# Patient Record
Sex: Male | Born: 1965 | Race: Black or African American | Hispanic: No | Smoking: Never smoker
Health system: Southern US, Community
[De-identification: ages and names within clinical notes are randomized; demographics above are authoritative.]

## PROBLEM LIST (undated history)

## (undated) DIAGNOSIS — K4090 Unilateral inguinal hernia, without obstruction or gangrene, not specified as recurrent: Secondary | ICD-10-CM

## (undated) DIAGNOSIS — F1010 Alcohol abuse, uncomplicated: Secondary | ICD-10-CM

## (undated) DIAGNOSIS — F1021 Alcohol dependence, in remission: Secondary | ICD-10-CM

## (undated) HISTORY — DX: Alcohol dependence, in remission: F10.21

## (undated) HISTORY — DX: Unilateral inguinal hernia, without obstruction or gangrene, not specified as recurrent: K40.90

## (undated) HISTORY — PX: MANDIBLE FRACTURE SURGERY: SHX706

---

## 2005-08-04 ENCOUNTER — Emergency Department: Payer: Self-pay | Admitting: General Practice

## 2005-08-04 ENCOUNTER — Other Ambulatory Visit: Payer: Self-pay

## 2015-09-14 ENCOUNTER — Other Ambulatory Visit: Payer: Self-pay | Admitting: Internal Medicine

## 2015-09-14 ENCOUNTER — Ambulatory Visit: Payer: Self-pay | Admitting: Internal Medicine

## 2015-09-14 DIAGNOSIS — F17200 Nicotine dependence, unspecified, uncomplicated: Secondary | ICD-10-CM

## 2015-09-14 DIAGNOSIS — I10 Essential (primary) hypertension: Secondary | ICD-10-CM | POA: Insufficient documentation

## 2015-09-14 DIAGNOSIS — R634 Abnormal weight loss: Secondary | ICD-10-CM

## 2015-09-15 ENCOUNTER — Other Ambulatory Visit: Payer: Self-pay

## 2015-09-15 ENCOUNTER — Ambulatory Visit
Admission: RE | Admit: 2015-09-15 | Discharge: 2015-09-15 | Disposition: A | Discharge status: Home / Self Care | Payer: PRIVATE HEALTH INSURANCE | Source: Ambulatory Visit | Attending: Internal Medicine | Admitting: Internal Medicine

## 2015-09-15 DIAGNOSIS — R634 Abnormal weight loss: Secondary | ICD-10-CM

## 2015-09-15 DIAGNOSIS — F17200 Nicotine dependence, unspecified, uncomplicated: Secondary | ICD-10-CM

## 2015-09-15 DIAGNOSIS — R0602 Shortness of breath: Secondary | ICD-10-CM | POA: Insufficient documentation

## 2015-09-15 LAB — BASIC METABOLIC PANEL
BUN: 3 mg/dL — AB (ref 4–21)
Creatinine: 0.6 mg/dL (ref 0.6–1.3)
Glucose: 89 mg/dL
Sodium: 141 mmol/L (ref 137–147)

## 2015-09-15 LAB — HEMOGLOBIN A1C: Hemoglobin A1C: 6

## 2015-09-15 LAB — CBC AND DIFFERENTIAL
HEMATOCRIT: 39 % — AB (ref 41–53)
HEMOGLOBIN: 13.5 g/dL (ref 13.5–17.5)
NEUTROS ABS: 3 /uL
PLATELETS: 152 10*3/uL (ref 150–399)
WBC: 4.6 10^3/mL

## 2015-09-15 LAB — HEPATIC FUNCTION PANEL
ALT: 78 U/L — AB (ref 10–40)
Alkaline Phosphatase: 162 U/L — AB (ref 25–125)
Bilirubin, Total: 0.4 mg/dL

## 2015-09-15 LAB — TSH: TSH: 2.66 u[IU]/mL (ref 0.41–5.90)

## 2015-09-28 ENCOUNTER — Ambulatory Visit: Payer: Self-pay | Admitting: Internal Medicine

## 2015-10-04 ENCOUNTER — Ambulatory Visit: Payer: Self-pay

## 2015-10-06 ENCOUNTER — Ambulatory Visit: Payer: Self-pay

## 2015-10-08 ENCOUNTER — Emergency Department
Admission: EM | Admit: 2015-10-08 | Discharge: 2015-10-08 | Disposition: A | Discharge status: Home / Self Care | Payer: PRIVATE HEALTH INSURANCE | Attending: Emergency Medicine | Admitting: Emergency Medicine

## 2015-10-08 ENCOUNTER — Encounter: Payer: Self-pay | Admitting: Emergency Medicine

## 2015-10-08 DIAGNOSIS — Z87891 Personal history of nicotine dependence: Secondary | ICD-10-CM | POA: Insufficient documentation

## 2015-10-08 DIAGNOSIS — F10920 Alcohol use, unspecified with intoxication, uncomplicated: Secondary | ICD-10-CM

## 2015-10-08 DIAGNOSIS — F10120 Alcohol abuse with intoxication, uncomplicated: Secondary | ICD-10-CM | POA: Insufficient documentation

## 2015-10-08 LAB — BASIC METABOLIC PANEL
ANION GAP: 10 (ref 5–15)
BUN: 7 mg/dL (ref 6–20)
CHLORIDE: 98 mmol/L — AB (ref 101–111)
CO2: 30 mmol/L (ref 22–32)
Calcium: 9.1 mg/dL (ref 8.9–10.3)
Creatinine, Ser: 0.64 mg/dL (ref 0.61–1.24)
GFR calc Af Amer: >60 mL/min (ref >60)
GFR calc non Af Amer: >60 mL/min (ref >60)
GLUCOSE: 102 mg/dL — AB (ref 65–99)
POTASSIUM: 4 mmol/L (ref 3.5–5.1)
Sodium: 138 mmol/L (ref 135–145)

## 2015-10-08 LAB — ETHANOL: Alcohol, Ethyl (B): <5 mg/dL (ref <5)

## 2015-10-08 MED ORDER — LORAZEPAM 2 MG/ML IJ SOLN: 0.0000 mg | Freq: Four times a day (QID) | INTRAMUSCULAR | Status: DC

## 2015-10-08 MED ORDER — FOLIC ACID 1 MG PO TABS: 1.0000 mg | ORAL_TABLET | Freq: Once | ORAL | Status: DC

## 2015-10-08 MED ORDER — VITAMIN B-1 100 MG PO TABS
100.0000 mg | ORAL_TABLET | Freq: Every day | ORAL | Status: DC
  Administered 2015-10-08: 100 mg via ORAL
  Filled 2015-10-08: qty 1

## 2015-10-08 MED ORDER — LORAZEPAM 1 MG PO TABS
ORAL_TABLET | ORAL | Status: AC
  Filled 2015-10-08: qty 1

## 2015-10-08 MED ORDER — LORAZEPAM 0.5 MG PO TABS: 1.5000 mg | ORAL_TABLET | Freq: Once | ORAL | Status: DC

## 2015-10-08 MED ORDER — VITAMIN B-1 100 MG PO TABS: 100.0000 mg | ORAL_TABLET | Freq: Once | ORAL | Status: DC

## 2015-10-08 MED ORDER — LORAZEPAM 2 MG/ML IJ SOLN
0.0000 mg | Freq: Two times a day (BID) | INTRAMUSCULAR | Status: DC
  Administered 2015-10-08: 1 mg via INTRAVENOUS

## 2015-10-08 MED ORDER — THIAMINE HCL 100 MG/ML IJ SOLN: 100.0000 mg | Freq: Every day | INTRAMUSCULAR | Status: DC

## 2015-10-08 MED ORDER — LORAZEPAM 2 MG PO TABS: 0.0000 mg | ORAL_TABLET | Freq: Four times a day (QID) | ORAL | Status: DC

## 2015-10-08 MED ORDER — LORAZEPAM 2 MG PO TABS: 0.0000 mg | ORAL_TABLET | Freq: Two times a day (BID) | ORAL | Status: DC

## 2015-10-08 NOTE — ED Notes (Signed)
Discussed plan of care with patient, physician and patient's mother.  Patient verbalized understanding.

## 2015-10-08 NOTE — ED Notes (Signed)
Per CSW Joni Reining, pt can be d/c from ED, transported to [facility] by mother since [facility] has available beds and can assess the pt there. MD Malinda made aware, d/c pt at this time. Pt and pt's mother made aware and verbalized understanding at this time

## 2015-10-08 NOTE — ED Notes (Signed)
Patient resting quietly in room.  Plan of care discussed.  Will continue to monitor.

## 2015-10-08 NOTE — ED Notes (Signed)
States requesting detox for alcohol abuse, states has stopped before when in jail but has withdrawal symptoms. States last drink yesterday.

## 2015-10-08 NOTE — ED Notes (Signed)
Pt ambulatory to bathroom at this time independently with no concerns. Pt tolerated well, no acute distress noted at this time

## 2015-10-08 NOTE — Discharge Instructions (Signed)
Alcohol Intoxication Alcohol intoxication occurs when the amount of alcohol that a person has consumed impairs his or her ability to mentally and physically function. Alcohol directly impairs the normal chemical activity of the brain. Drinking large amounts of alcohol can lead to changes in mental function and behavior, and it can cause many physical effects that can be harmful.  Alcohol intoxication can range in severity from mild to very severe. Various factors can affect the level of intoxication that occurs, such as the person's age, gender, weight, frequency of alcohol consumption, and the presence of other medical conditions (such as diabetes, seizures, or heart conditions). Dangerous levels of alcohol intoxication may occur when people drink large amounts of alcohol in a short period (binge drinking). Alcohol can also be especially dangerous when combined with certain prescription medicines or "recreational" drugs. SIGNS AND SYMPTOMS Some common signs and symptoms of mild alcohol intoxication include:  Loss of coordination.  Changes in mood and behavior.  Impaired judgment.  Slurred speech. As alcohol intoxication progresses to more severe levels, other signs and symptoms will appear. These may include:  Vomiting.  Confusion and impaired memory.  Slowed breathing.  Seizures.  Loss of consciousness. DIAGNOSIS  Your health care provider will take a medical history and perform a physical exam. You will be asked about the amount and type of alcohol you have consumed. Blood tests will be done to measure the concentration of alcohol in your blood. In many places, your blood alcohol level must be lower than 80 mg/dL (0.08%) to legally drive. However, many dangerous effects of alcohol can occur at much lower levels.  TREATMENT  People with alcohol intoxication often do not require treatment. Most of the effects of alcohol intoxication are temporary, and they go away as the alcohol naturally  leaves the body. Your health care provider will monitor your condition until you are stable enough to go home. Fluids are sometimes given through an IV access tube to help prevent dehydration.  HOME CARE INSTRUCTIONS  Do not drive after drinking alcohol.  Stay hydrated. Drink enough water and fluids to keep your urine clear or pale yellow. Avoid caffeine.   Only take over-the-counter or prescription medicines as directed by your health care provider.  SEEK MEDICAL CARE IF:   You have persistent vomiting.   You do not feel better after a few days.  You have frequent alcohol intoxication. Your health care provider can help determine if you should see a substance use treatment counselor. SEEK IMMEDIATE MEDICAL CARE IF:   You become shaky or tremble when you try to stop drinking.   You shake uncontrollably (seizure).   You throw up (vomit) blood. This may be bright red or may look like black coffee grounds.   You have blood in your stool. This may be bright red or may appear as a black, tarry, bad smelling stool.   You become lightheaded or faint.  MAKE SURE YOU:   Understand these instructions.  Will watch your condition.  Will get help right away if you are not doing well or get worse.   This information is not intended to replace advice given to you by your health care provider. Make sure you discuss any questions you have with your health care provider.   Document Released: 06/13/2005 Document Revised: 05/06/2013 Document Reviewed: 02/06/2013 Elsevier Interactive Patient Education 2016 [employer].  Alcohol Use Disorder Alcohol use disorder is a mental disorder. It is not a one-time incident of heavy drinking. Alcohol use  Document Released: 06/13/2005 Document Revised: 05/06/2013 Document Reviewed: 02/06/2013  Elsevier Interactive Patient Education ©2016 Else[employer] Inc.    Alcohol Use Disorder  Alcohol use disorder is a mental disorder. It is not a one-time incident of heavy drinking. Alcohol use disorder is the excessive and uncontrollable use of alcohol over time that leads to problems with functioning in one or more areas of daily living. People with this disorder risk harming themselves and others when they drink to excess. Alcohol use  disorder also can cause other mental disorders, such as mood and anxiety disorders, and serious physical problems. People with alcohol use disorder often misuse other drugs.   Alcohol use disorder is common and widespread. Some people with this disorder drink alcohol to cope with or escape from negative life events. Others drink to relieve chronic pain or symptoms of mental illness. People with a family history of alcohol use disorder are at higher risk of losing control and using alcohol to excess.   Drinking too much alcohol can cause injury, accidents, and health problems. One drink can be too much when you are:  · Working.  · Pregnant or breastfeeding.  · Taking medicines. Ask your doctor.  · Driving or planning to drive.  SYMPTOMS   Signs and symptoms of alcohol use disorder may include the following:   · Consumption of alcohol in larger amounts or over a longer period of time than intended.  · Multiple unsuccessful attempts to cut down or control alcohol use.    · A great deal of time spent obtaining alcohol, using alcohol, or recovering from the effects of alcohol (hangover).  · A strong desire or urge to use alcohol (cravings).    · Continued use of alcohol despite problems at work, school, or home because of alcohol use.    · Continued use of alcohol despite problems in relationships because of alcohol use.  · Continued use of alcohol in situations when it is physically hazardous, such as driving a car.  · Continued use of alcohol despite awareness of a physical or psychological problem that is likely related to alcohol use. Physical problems related to alcohol use can involve the brain, heart, liver, stomach, and intestines. Psychological problems related to alcohol use include intoxication, depression, anxiety, psychosis, delirium, and dementia.    · The need for increased amounts of alcohol to achieve the same desired effect, or a decreased effect from the consumption of the same amount of alcohol  (tolerance).  · Withdrawal symptoms upon reducing or stopping alcohol use, or alcohol use to reduce or avoid withdrawal symptoms. Withdrawal symptoms include:  ¨ Racing heart.  ¨ Hand tremor.  ¨ Difficulty sleeping.  ¨ Nausea.  ¨ Vomiting.  ¨ Hallucinations.  ¨ Restlessness.  ¨ Seizures.  DIAGNOSIS  Alcohol use disorder is diagnosed through an assessment by your health care provider. Your health care provider may start by asking three or four questions to screen for excessive or problematic alcohol use. To confirm a diagnosis of alcohol use disorder, at least two symptoms must be present within a 12-month period. The severity of alcohol use disorder depends on the number of symptoms:  · Mild--two or three.  · Moderate--four or five.  · Severe--six or more.  Your health care provider may perform a physical exam or use results from lab tests to see if you have physical problems resulting from alcohol use. Your health care provider may refer you to a mental health professional for evaluation.  TREATMENT   Some people with alcohol use disorder are   withdrawal symptoms in the first week after quitting. This is important for people with a history of symptoms of withdrawal and for heavy drinkers who are likely to have withdrawal symptoms. Alcohol withdrawal can be dangerous and, in severe cases, cause death. Detoxification is usually provided in a hospital or in-patient substance use treatment facility.  Counseling or talk therapy.  Talk therapy is provided by substance use treatment counselors. It addresses the reasons people use alcohol and ways to keep them from drinking again. The goals of talk therapy are to help people with alcohol use disorder find healthy activities and ways to cope with life stress, to identify and avoid triggers for alcohol use, and to handle cravings, which can cause relapse.  Medicines.Different medicines can help treat alcohol use disorder through the following actions:  Decrease alcohol cravings.  Decrease the positive reward response felt from alcohol use.  Produce an uncomfortable physical reaction when alcohol is used (aversion therapy).  Support groups. Support groups are run by people who have quit drinking. They provide emotional support, advice, and guidance. These forms of treatment are often combined. Some people with alcohol use disorder benefit from intensive combination treatment provided by specialized substance use treatment centers. Both inpatient and outpatient treatment programs are available.   This information is not intended to replace advice given to you by your health care provider. Make sure you discuss any questions you have with your health care provider.   Document Released: 10/11/2004 Document Revised: 09/24/2014 Document Reviewed: 12/11/2012 Elsevier Interactive Patient Education 2016 [employer].   Goes straight to [facility] now.

## 2015-10-08 NOTE — ED Provider Notes (Signed)
[facility] Emergency Department Provider Note  ____________________________________________  Time seen: Approximately 2:35 PM  I have reviewed the triage vital signs and the nursing notes.   HISTORY  Chief Complaint Addiction Problem    HPI Kristopher Duran is a 50 y.o. male presents for evaluation of alcohol withdrawal. The patient states he stopped drinking yesterday, he normally drinks 4 x 40 ounce beers and has been experiencing the shakes since this morning. His a history ofalcoholism. He reports he had withdrawals once when he was in jail. He otherwise reports he drinks most every day. He is not suicidal, denies hallucinations, has no history of withdrawal seizure. He reports that he is just tired of the way alcohol is been interfering with his life and would like to stop drinking. He is amenable and requesting evaluation for detox and a detox facility.   Past Medical History  Diagnosis Date  . Alcohol abuse     There are no active problems to display for this patient.   Past Surgical History  Procedure Laterality Date  . Mandible fracture surgery      No current outpatient prescriptions on file.  Allergies Review of patient's allergies indicates no known allergies.  No family history on file.  Social History Social History  Substance Use Topics  . Smoking status: Former Games developer  . Smokeless tobacco: None  . Alcohol Use: Yes    Review of Systems Constitutional: No fever/chills Eyes: No visual changes. ENT: No sore throat. Cardiovascular: Denies chest pain. Respiratory: Denies shortness of breath. Gastrointestinal: No abdominal pain.  No nausea, no vomiting.  No diarrhea.  No constipation. Genitourinary: Negative for dysuria. Musculoskeletal: Negative for back pain. Skin: Negative for rash. Neurological: Negative for headaches, focal weakness or numbness. Does report having "the shakes".  10-point ROS otherwise  negative.  ____________________________________________   PHYSICAL EXAM:  VITAL SIGNS: ED Triage Vitals  Enc Vitals Group     BP 10/08/15 1030 139/90 mmHg     Pulse Rate 10/08/15 1030 76     Resp 10/08/15 1030 18     Temp 10/08/15 1030 97.7 F (36.5 C)     Temp Source 10/08/15 1030 Oral     SpO2 10/08/15 1030 100 %     Weight 10/08/15 1030 128 lb (58.06 kg)     Height 10/08/15 1030  (1.753 m)     Head Cir --      Peak Flow --      Pain Score --      Pain Loc --      Pain Edu? --      Excl. in GC? --    Constitutional: Alert and oriented. Well appearing and in no acute distress. Eyes: Conjunctivae are normal. PERRL. EOMI. Head: Atraumatic. Slightly diaphoretic on the forehead. Nose: No congestion/rhinnorhea. Mouth/Throat: Mucous membranes are moist.  Oropharynx non-erythematous. Neck: No stridor.   Cardiovascular: Normal rate, regular rhythm. Grossly normal heart sounds.  Good peripheral circulation. Respiratory: Normal respiratory effort.  No retractions. Lungs CTAB. Gastrointestinal: Soft and nontender. No distention. No abdominal bruits. No CVA tenderness. Musculoskeletal: No lower extremity tenderness nor edema.  No joint effusions. Neurologic:  Normal speech and language. No gross focal neurologic deficits are appreciated though the patient does demonstrate a moderate high-frequency tremor in the bilateral upper extremities. No gait instability. Skin:  Skin is warm, dry and intact. No rash noted. Psychiatric: Mood and affect are normal. Speech and behavior are normal.  ____________________________________________   LABS (all  labs ordered are listed, but only abnormal results are displayed)  Labs Reviewed  BASIC METABOLIC PANEL - Abnormal; Notable for the following:    Chloride 98 (*)    Glucose, Bld 102 (*)    All other components within normal limits  ETHANOL    ____________________________________________  EKG   ____________________________________________  RADIOLOGY   ____________________________________________   PROCEDURES  Procedure(s) performed: None  Critical Care performed: No  ____________________________________________   INITIAL IMPRESSION / ASSESSMENT AND PLAN / ED COURSE  Pertinent labs & imaging results that were available during my care of the patient were reviewed by me and considered in my medical decision making (see chart for details).  Patient requests detox. Currently signs and symptoms of mild alcohol withdrawal. No history of complicated withdrawal and no evidence of active hallucinations or delirium tremens. We will treat him with alcohol withdrawal protocol, consult to [facility] services for detox placement. Patient is agreeable with this plan. No signs or symptoms suggest acute medical condition aside from alcohol withdrawal. Denies any acute psychiatric symptoms. ____________________________________________  Ongoing care and disposition assigned to Dr. Darnelle Catalan. Patient currently on treatment for alcohol withdrawal, pending detox placement.  FINAL CLINICAL IMPRESSION(S) / ED DIAGNOSES  Final diagnoses:  Acute alcoholic intoxication without complication (HCC)      Sharyn Creamer, MD 10/08/15 1649

## 2015-10-08 NOTE — Progress Notes (Signed)
Pt labs, vitals and MAR faxed to [facility]. [facility] awaiting a return phone call.   10/08/2015 Cheryl Flash, MS, NCC, [facility] Therapeutic Triage Specialist

## 2015-11-04 DIAGNOSIS — I10 Essential (primary) hypertension: Secondary | ICD-10-CM

## 2015-11-09 ENCOUNTER — Encounter: Payer: Self-pay | Admitting: Physician Assistant

## 2015-11-09 ENCOUNTER — Ambulatory Visit: Payer: Self-pay | Admitting: Physician Assistant

## 2015-11-09 VITALS — BP 114/78 | HR 76 | Temp 98.1°F | Ht 68.0 in | Wt 144.6 lb

## 2015-11-09 DIAGNOSIS — Z125 Encounter for screening for malignant neoplasm of prostate: Secondary | ICD-10-CM

## 2015-11-09 DIAGNOSIS — Z131 Encounter for screening for diabetes mellitus: Secondary | ICD-10-CM

## 2015-11-09 DIAGNOSIS — Z13220 Encounter for screening for lipoid disorders: Secondary | ICD-10-CM

## 2015-11-09 DIAGNOSIS — K4090 Unilateral inguinal hernia, without obstruction or gangrene, not specified as recurrent: Secondary | ICD-10-CM

## 2015-11-09 DIAGNOSIS — F1021 Alcohol dependence, in remission: Secondary | ICD-10-CM | POA: Insufficient documentation

## 2015-11-09 DIAGNOSIS — K469 Unspecified abdominal hernia without obstruction or gangrene: Secondary | ICD-10-CM | POA: Insufficient documentation

## 2015-11-09 LAB — GLUCOSE, POCT (MANUAL RESULT ENTRY): POC GLUCOSE: 92 mg/dL (ref 70–99)

## 2015-11-09 NOTE — Patient Instructions (Signed)
Blood draw Thursday Turn in [employer] application

## 2015-11-09 NOTE — Progress Notes (Signed)
BP 114/78 mmHg  Pulse 76  Temp(Src) 98.1 F (36.7 C)  Ht  (1.727 m)  Wt 144 lb 9.6 oz (65.59 kg)  BMI 21.99 kg/m2  SpO2 97%   Subjective:    Patient ID: Kristopher Duran, male    DOB: 01/10/1966, 50 y.o.   MRN: [id number]  HPI: Kristopher Duran is a 50 y.o. male presenting on 11/09/2015 for New Patient (Initial Visit) and Mass   HPI Chief Complaint  Patient presents with  . New Patient (Initial Visit)  . Mass    in lower abdomen, goes away when reclines, sometimes is painful; has been present intermitently for 10 yrs, has gotten much larger since stopped drinking and started eating again     Mass as above. Sometimes it hurts, but not all the time  Relevant past medical, surgical, family and social history reviewed and updated as indicated. Interim medical history since our last visit reviewed. Allergies and medications reviewed and updated.  Review of Systems  Constitutional: Negative for fever, chills, diaphoresis, appetite change, fatigue and unexpected weight change.  HENT: Negative for congestion, dental problem, drooling, ear pain, facial swelling, hearing loss, mouth sores, sneezing, sore throat, trouble swallowing and voice change.   Eyes: Negative for pain, discharge, redness, itching and visual disturbance.  Respiratory: Negative for cough, choking, shortness of breath and wheezing.   Cardiovascular: Negative for chest pain, palpitations and leg swelling.  Gastrointestinal: Negative for vomiting, abdominal pain, diarrhea, constipation and blood in stool.  Endocrine: Negative for cold intolerance, heat intolerance and polydipsia.  Genitourinary: Negative for dysuria, hematuria and decreased urine volume.  Musculoskeletal: Negative for back pain, arthralgias and gait problem.  Skin: Negative for rash.  Allergic/Immunologic: Negative for environmental allergies.  Neurological: Negative for seizures, syncope, light-headedness and headaches.  Hematological: Negative  for adenopathy.  Psychiatric/Behavioral: Negative for suicidal ideas, dysphoric mood and agitation. The patient is not nervous/anxious.     Per HPI unless specifically indicated above     Objective:    BP 114/78 mmHg  Pulse 76  Temp(Src) 98.1 F (36.7 C)  Ht  (1.727 m)  Wt 144 lb 9.6 oz (65.59 kg)  BMI 21.99 kg/m2  SpO2 97%  Wt Readings from Last 3 Encounters:  11/09/15 144 lb 9.6 oz (65.59 kg)  10/08/15 128 lb (58.06 kg)    Physical Exam  Constitutional: He is oriented to person, place, and time. He appears well-developed and well-nourished.  HENT:  Head: Normocephalic and atraumatic.  Mouth/Throat: Oropharynx is clear and moist. No oropharyngeal exudate.  Eyes: Conjunctivae and EOM are normal. Pupils are equal, round, and reactive to light.  Neck: Neck supple. No thyromegaly present.  Cardiovascular: Normal rate and regular rhythm.   Pulmonary/Chest: Effort normal and breath sounds normal. He has no wheezes. He has no rales.  Abdominal: Soft. Bowel sounds are normal. He exhibits no mass. There is no hepatosplenomegaly. There is no tenderness. A hernia is present. Hernia confirmed positive in the left inguinal area (extending into scrotum, reducible, non-tender).  Musculoskeletal: He exhibits no edema.  Lymphadenopathy:    He has no cervical adenopathy.  Neurological: He is alert and oriented to person, place, and time.  Skin: Skin is warm and dry. No rash noted.  Psychiatric: He has a normal mood and affect. His behavior is normal. Thought content normal.  Vitals reviewed.  L ing hernia exteinding ito scrotum Results for orders placed or performed in visit on 11/09/15  POCT Glucose (CBG)  Result  Value Ref Range   POC Glucose 92 70 - 99 mg/dl      Assessment & Plan:   Encounter Diagnoses  Name Primary?  . Unilateral inguinal hernia without obstruction or gangrene, recurrence not specified Yes  . Screening for diabetes mellitus   . Screening cholesterol level    . Screening for prostate cancer     -Check fasting lipids and LFTs tomorrow morning -Refer surgeon for hernia.  Pt counseled to go to ER if it becomes strangulated (explained this to him) -Gave [facility] discount application -F/u 1 month

## 2015-12-06 ENCOUNTER — Ambulatory Visit: Payer: Self-pay

## 2015-12-07 ENCOUNTER — Ambulatory Visit: Payer: Self-pay | Admitting: Physician Assistant

## 2015-12-08 ENCOUNTER — Encounter: Payer: Self-pay | Admitting: Physician Assistant

## 2015-12-19 ENCOUNTER — Other Ambulatory Visit
Admission: RE | Admit: 2015-12-19 | Discharge: 2015-12-19 | Disposition: A | Discharge status: Home / Self Care | Attending: Family Medicine | Admitting: Family Medicine

## 2015-12-19 NOTE — ED Notes (Signed)
Patient ambulatory to triage with steady gait, without difficulty or distress noted, in custody of [place] [employer] officer Koleen Nimrod for forensic blood draw; pt A&Ox3, with no c/o voiced and denies need to see ED provider; pt voices good understanding of blood draw to be performed for forensic testing; using sealed kit provided by officer, tourniquet applied to right upper arm; right antecubital region prepped with betadine swab and allowed to dry completely; needle inserted and 2 grey top blood tubes collected; tourniquet removed, needle removed & intact, dressing applied; tubes labeled, given to officer and placed in sealed container using chain of custody; pt tolerated well and continues to deny c/o or need to see ED provider; pt d/c in police custody

## 2015-12-20 ENCOUNTER — Encounter: Payer: Self-pay | Admitting: Emergency Medicine

## 2015-12-20 ENCOUNTER — Emergency Department
Admission: EM | Admit: 2015-12-20 | Discharge: 2015-12-20 | Disposition: A | Discharge status: Rehabilitation Facility | Payer: PRIVATE HEALTH INSURANCE | Attending: Emergency Medicine | Admitting: Emergency Medicine

## 2015-12-20 DIAGNOSIS — Z87891 Personal history of nicotine dependence: Secondary | ICD-10-CM | POA: Insufficient documentation

## 2015-12-20 DIAGNOSIS — I10 Essential (primary) hypertension: Secondary | ICD-10-CM | POA: Insufficient documentation

## 2015-12-20 DIAGNOSIS — F10120 Alcohol abuse with intoxication, uncomplicated: Secondary | ICD-10-CM | POA: Insufficient documentation

## 2015-12-20 DIAGNOSIS — F10920 Alcohol use, unspecified with intoxication, uncomplicated: Secondary | ICD-10-CM

## 2015-12-20 DIAGNOSIS — F1010 Alcohol abuse, uncomplicated: Secondary | ICD-10-CM

## 2015-12-20 LAB — COMPREHENSIVE METABOLIC PANEL
ALBUMIN: 4.4 g/dL (ref 3.5–5.0)
ALT: 38 U/L (ref 17–63)
ANION GAP: 9 (ref 5–15)
AST: 133 U/L — AB (ref 15–41)
Alkaline Phosphatase: 88 U/L (ref 38–126)
CHLORIDE: 102 mmol/L (ref 101–111)
CO2: 26 mmol/L (ref 22–32)
Calcium: 8.8 mg/dL — ABNORMAL LOW (ref 8.9–10.3)
Creatinine, Ser: 0.75 mg/dL (ref 0.61–1.24)
GFR calc Af Amer: >60 mL/min (ref >60)
GFR calc non Af Amer: >60 mL/min (ref >60)
GLUCOSE: 101 mg/dL — AB (ref 65–99)
POTASSIUM: 3.5 mmol/L (ref 3.5–5.1)
SODIUM: 137 mmol/L (ref 135–145)
TOTAL PROTEIN: 7.6 g/dL (ref 6.5–8.1)
Total Bilirubin: 0.7 mg/dL (ref 0.3–1.2)

## 2015-12-20 LAB — ETHANOL
ALCOHOL ETHYL (B): 321 mg/dL — AB (ref <5)
ALCOHOL ETHYL (B): 160 mg/dL — AB (ref <5)
ALCOHOL ETHYL (B): 64 mg/dL — AB (ref <5)

## 2015-12-20 LAB — URINE DRUG SCREEN, QUALITATIVE (ARMC ONLY)
Amphetamines, Ur Screen: NOT DETECTED
BARBITURATES, UR SCREEN: NOT DETECTED
BENZODIAZEPINE, UR SCRN: NOT DETECTED
CANNABINOID 50 NG, UR ~~LOC~~: NOT DETECTED
Cocaine Metabolite,Ur ~~LOC~~: NOT DETECTED
MDMA (Ecstasy)Ur Screen: NOT DETECTED
METHADONE SCREEN, URINE: NOT DETECTED
Opiate, Ur Screen: NOT DETECTED
Phencyclidine (PCP) Ur S: NOT DETECTED
TRICYCLIC, UR SCREEN: NOT DETECTED

## 2015-12-20 LAB — CBC
HEMATOCRIT: 43.5 % (ref 40.0–52.0)
HEMOGLOBIN: 14.5 g/dL (ref 13.0–18.0)
MCH: 30 pg (ref 26.0–34.0)
MCHC: 33.4 g/dL (ref 32.0–36.0)
MCV: 89.8 fL (ref 80.0–100.0)
Platelets: 253 10*3/uL (ref 150–440)
RBC: 4.84 MIL/uL (ref 4.40–5.90)
RDW: 14.1 % (ref 11.5–14.5)
WBC: 5.7 10*3/uL (ref 3.8–10.6)

## 2015-12-20 MED ORDER — VITAMIN B-1 100 MG PO TABS
100.0000 mg | ORAL_TABLET | Freq: Once | ORAL | Status: AC
  Administered 2015-12-20: 100 mg via ORAL
  Filled 2015-12-20: qty 1

## 2015-12-20 MED ORDER — FOLIC ACID 1 MG PO TABS
1.0000 mg | ORAL_TABLET | Freq: Once | ORAL | Status: AC
  Administered 2015-12-20: 1 mg via ORAL
  Filled 2015-12-20: qty 1

## 2015-12-20 MED ORDER — LORAZEPAM 2 MG PO TABS
0.0000 mg | ORAL_TABLET | Freq: Four times a day (QID) | ORAL | Status: DC
  Administered 2015-12-20: 2 mg via ORAL
  Administered 2015-12-20: 1 mg via ORAL
  Filled 2015-12-20 (×2): qty 1

## 2015-12-20 MED ORDER — LORAZEPAM 2 MG PO TABS: 0.0000 mg | ORAL_TABLET | Freq: Two times a day (BID) | ORAL | Status: DC

## 2015-12-20 NOTE — Discharge Instructions (Signed)
You have been seen in the Emergency Department (ED) today for substance abuse.  You have been evaluated by the behavioral medicine specialists and are being [facility]  Please return to the ED immediately if you have ANY thoughts of hurting yourself or anyone else, so that we may help you.  Please avoid alcohol and drug use.  Follow up with your doctor and/or therapist as soon as possible regarding today's ED  visit.   Please follow up any other recommendations and clinic appointments provided by the psychiatry team that saw you in the Emergency Department.   Alcohol Abuse and Nutrition Alcohol abuse is any pattern of alcohol consumption that harms your health, relationships, or work. Alcohol abuse can affect how your body breaks down and absorbs nutrients from food by causing your liver to work abnormally. Additionally, many people who abuse alcohol do not eat enough carbohydrates, protein, fat, vitamins, and minerals. This can cause poor nutrition (malnutrition) and a lack of nutrients (nutrient deficiencies), which can lead to further complications. Nutrients that are commonly lacking (deficient) among people who abuse alcohol include:  Vitamins.  Vitamin A. This is stored in your liver. It is important for your vision, metabolism, and ability to fight off infections (immunity).  B vitamins. These include vitamins such as folate, thiamin, and niacin. These are important in new cell growth and maintenance.  Vitamin C. This plays an important role in iron absorption, wound healing, and immunity.  Vitamin D. This is produced by your liver, but you can also get vitamin D from food. Vitamin D is necessary for your body to absorb and use calcium.  Minerals.  Calcium. This is important for your bones and your heart and blood vessel (cardiovascular) function.  Iron. This is important for blood, muscle, and nervous system functioning.  Magnesium. This  plays an important role in muscle and nerve function, and it helps to control blood sugar and blood pressure.  Zinc. This is important for the normal function of your nervous system and digestive system (gastrointestinal tract). Nutrition is an essential component of therapy for alcohol abuse. Your health care provider or dietitian will work with you to design a plan that can help restore nutrients to your body and prevent potential complications. WHAT IS MY PLAN? Your dietitian may develop a specific diet plan that is based on your condition and any other complications you may have. A diet plan will commonly include:  A balanced diet.  Grains: 6-8 oz per day.  Vegetables: 2-3 cups per day.  Fruits: 1-2 cups per day.  Meat and other protein: 5-6 oz per day.  Dairy: 2-3 cups per day.  Vitamin and mineral supplements. WHAT DO I NEED TO KNOW ABOUT ALCOHOL AND NUTRITION?  Consume foods that are high in antioxidants, such as grapes, berries, nuts, green tea, and dark green and orange vegetables. This can help to counteract some of the stress that is placed on your liver by consuming alcohol.  Avoid food and drinks that are high in fat and sugar. Foods such as sugared soft drinks, salty snack foods, and candy contain empty calories. This means that they lack important nutrients such as protein, fiber, and vitamins.  Eat frequent meals and snacks. Try to eat 5-6 small meals each day.  Eat a variety of fresh fruits and vegetables each day. This will help you get plenty of water, fiber, and vitamins in your diet.  Drink plenty of water and other clear fluids. Try  to drink at least 48-64 oz (1.5-2 L) of water per day.  If you are a vegetarian, eat a variety of protein-rich foods. Pair whole grains with plant-based proteins at meals and snacks to obtain the greatest nutrient benefit from your food. For example, eat rice with beans, put peanut butter on whole-grain toast, or eat oatmeal with  sunflower seeds.  Soak beans and whole grains overnight before cooking. This can help your body to absorb the nutrients more easily.  Include foods fortified with vitamins and minerals in your diet. Commonly fortified foods include milk, orange juice, cereal, and bread.  If you are malnourished, your dietitian may recommend a high-protein, high-calorie diet. This may include:  2,000-3,000 calories (kilocalories) per day.  70-100 grams of protein per day.  Your health care provider may recommend a complete nutritional supplement beverage. This can help to restore calories, protein, and vitamins to your body. Depending on your condition, you may be advised to consume this instead of or in addition to meals.  Limit your intake of caffeine. Replace drinks like coffee and black tea with decaffeinated coffee and herbal tea.  Eat a variety of foods that are high in omega fatty acids. These include fish, nuts and seeds, and soybeans. These foods may help your liver to recover and may also stabilize your mood.  Certain medicines may cause changes in your appetite, taste, and weight. Work with your health care provider and dietitian to make any adjustments to your medicines and diet plan.  Include other healthy lifestyle choices in your daily routine.  Be physically active.  Get enough sleep.  Spend time doing activities that you enjoy.  If you are unable to take in enough food and calories by mouth, your health care provider may recommend a feeding tube. This is a tube that passes through your nose and throat, directly into your stomach. Nutritional supplement beverages can be given to you through the feeding tube to help you get the nutrients you need.  Take vitamin or mineral supplements as recommended by your health care provider. WHAT FOODS CAN I EAT? Grains Enriched pasta. Enriched rice. Fortified whole-grain bread. Fortified whole-grain cereal. Barley. Brown rice. Quinoa.  [place]. Vegetables All fresh, frozen, and canned vegetables. Spinach. Kale. Artichoke. Carrots. Winter squash and pumpkin. Sweet potatoes. Broccoli. Cabbage. Cucumbers. Tomatoes. Sweet peppers. Green beans. Peas. Corn. Fruits All fresh and frozen fruits. Berries. Grapes. Mango. Papaya. Guava. Cherries. Apples. Bananas. Peaches. Plums. Pineapple. Watermelon. Cantaloupe. Oranges. Avocado. Meats and Other Protein Sources Beef liver. Lean beef. Pork. Fresh and canned chicken. Fresh fish. Oysters. Sardines. Canned tuna. Shrimp. Eggs with yolks. Nuts and seeds. Peanut butter. Beans and lentils. Soybeans. Tofu. Dairy Whole, low-fat, and nonfat milk. Whole, low-fat, and nonfat yogurt. Cottage cheese. Sour cream. Hard and soft cheeses. Beverages Water. Herbal tea. Decaffeinated coffee. Decaffeinated green tea. 100% fruit juice. 100% vegetable juice. Instant breakfast shakes. Condiments Ketchup. Mayonnaise. Mustard. Salad dressing. Barbecue sauce. Sweets and Desserts Sugar-free ice cream. Sugar-free pudding. Sugar-free gelatin. Fats and Oils Butter. Vegetable oil, flaxseed oil, olive oil, and walnut oil. Other Complete nutrition shakes. Protein bars. Sugar-free gum. The items listed above may not be a complete list of recommended foods or beverages. Contact your dietitian for more options. WHAT FOODS ARE NOT RECOMMENDED? Grains Sugar-sweetened breakfast cereals. Flavored instant oatmeal. Fried breads. Vegetables Breaded or deep-fried vegetables. Fruits Dried fruit with added sugar. Candied fruit. Canned fruit in syrup. Meats and Other Protein Sources Breaded or deep-fried meats. Dairy Flavored milks. [place] Pro  cheese curds or fried cheese sticks. Beverages Alcohol. Sugar-sweetened soft drinks. Sugar-sweetened tea. Caffeinated coffee and tea. Condiments Sugar. Honey. Agave nectar. Molasses. Sweets and Desserts Chocolate. Cake. Cookies. Candy. Other Potato chips. Pretzels. Salted nuts. Candied  nuts. The items listed above may not be a complete list of foods and beverages to avoid. Contact your dietitian for more information.   This information is not intended to replace advice given to you by your health care provider. Make sure you discuss any questions you have with your health care provider.   Document Released: 06/28/2005 Document Revised: 09/24/2014 Document Reviewed: 04/06/2014 Elsevier Interactive Patient Education 2016 [employer].  Alcohol Intoxication Alcohol intoxication occurs when the amount of alcohol that a person has consumed impairs his or her ability to mentally and physically function. Alcohol directly impairs the normal chemical activity of the brain. Drinking large amounts of alcohol can lead to changes in mental function and behavior, and it can cause many physical effects that can be harmful.  Alcohol intoxication can range in severity from mild to very severe. Various factors can affect the level of intoxication that occurs, such as the person's age, gender, weight, frequency of alcohol consumption, and the presence of other medical conditions (such as diabetes, seizures, or heart conditions). Dangerous levels of alcohol intoxication may occur when people drink large amounts of alcohol in a short period (binge drinking). Alcohol can also be especially dangerous when combined with certain prescription medicines or "recreational" drugs. SIGNS AND SYMPTOMS Some common signs and symptoms of mild alcohol intoxication include:  Loss of coordination.  Changes in mood and behavior.  Impaired judgment.  Slurred speech. As alcohol intoxication progresses to more severe levels, other signs and symptoms will appear. These may include:  Vomiting.  Confusion and impaired memory.  Slowed breathing.  Seizures.  Loss of consciousness. DIAGNOSIS  Your health care provider will take a medical history and perform a physical exam. You will be asked about the amount  and type of alcohol you have consumed. Blood tests will be done to measure the concentration of alcohol in your blood. In many places, your blood alcohol level must be lower than 80 mg/dL (1.61%) to legally drive. However, many dangerous effects of alcohol can occur at much lower levels.  TREATMENT  People with alcohol intoxication often do not require treatment. Most of the effects of alcohol intoxication are temporary, and they go away as the alcohol naturally leaves the body. Your health care provider will monitor your condition until you are stable enough to go home. Fluids are sometimes given through an IV access tube to help prevent dehydration.  HOME CARE INSTRUCTIONS  Do not drive after drinking alcohol.  Stay hydrated. Drink enough water and fluids to keep your urine clear or pale yellow. Avoid caffeine.   Only take over-the-counter or prescription medicines as directed by your health care provider.  SEEK MEDICAL CARE IF:   You have persistent vomiting.   You do not feel better after a few days.  You have frequent alcohol intoxication. Your health care provider can help determine if you should see a substance use treatment counselor. SEEK IMMEDIATE MEDICAL CARE IF:   You become shaky or tremble when you try to stop drinking.   You shake uncontrollably (seizure).   You throw up (vomit) blood. This may be bright red or may look like black coffee grounds.   You have blood in your stool. This may be bright red or may appear as a black,  tarry, bad smelling stool.   You become lightheaded or faint.  MAKE SURE YOU:   Understand these instructions.  Will watch your condition.  Will get help right away if you are not doing well or get worse.   This information is not intended to replace advice given to you by your health care provider. Make sure you discuss any questions you have with your health care provider.   Document Released: 06/13/2005 Document Revised: 05/06/2013  Document Reviewed: 02/06/2013 Elsevier Interactive Patient Education 2016 [employer].  Alcohol Use Disorder Alcohol use disorder is a mental disorder. It is not a one-time incident of heavy drinking. Alcohol use disorder is the excessive and uncontrollable use of alcohol over time that leads to problems with functioning in one or more areas of daily living. People with this disorder risk harming themselves and others when they drink to excess. Alcohol use disorder also can cause other mental disorders, such as mood and anxiety disorders, and serious physical problems. People with alcohol use disorder often misuse other drugs.  Alcohol use disorder is common and widespread. Some people with this disorder drink alcohol to cope with or escape from negative life events. Others drink to relieve chronic pain or symptoms of mental illness. People with a family history of alcohol use disorder are at higher risk of losing control and using alcohol to excess.  Drinking too much alcohol can cause injury, accidents, and health problems. One drink can be too much when you are:  Working.  Pregnant or breastfeeding.  Taking medicines. Ask your doctor.  Driving or planning to drive. SYMPTOMS  Signs and symptoms of alcohol use disorder may include the following:   Consumption ofalcohol inlarger amounts or over a longer period of time than intended.  Multiple unsuccessful attempts to cutdown or control alcohol use.   A great deal of time spent obtaining alcohol, using alcohol, or recovering from the effects of alcohol (hangover).  A strong desire or urge to use alcohol (cravings).   Continued use of alcohol despite problems at work, school, or home because of alcohol use.   Continued use of alcohol despite problems in relationships because of alcohol use.  Continued use of alcohol in situations when it is physically hazardous, such as driving a car.  Continued use of alcohol despite awareness  of a physical or psychological problem that is likely related to alcohol use. Physical problems related to alcohol use can involve the brain, heart, liver, stomach, and intestines. Psychological problems related to alcohol use include intoxication, depression, anxiety, psychosis, delirium, and dementia.   The need for increased amounts of alcohol to achieve the same desired effect, or a decreased effect from the consumption of the same amount of alcohol (tolerance).  Withdrawal symptoms upon reducing or stopping alcohol use, or alcohol use to reduce or avoid withdrawal symptoms. Withdrawal symptoms include:  Racing heart.  Hand tremor.  Difficulty sleeping.  Nausea.  Vomiting.  Hallucinations.  Restlessness.  Seizures. DIAGNOSIS Alcohol use disorder is diagnosed through an assessment by your health care provider. Your health care provider may start by asking three or four questions to screen for excessive or problematic alcohol use. To confirm a diagnosis of alcohol use disorder, at least two symptoms must be present within a 60-month period. The severity of alcohol use disorder depends on the number of symptoms:  Mild--two or three.  Moderate--four or five.  Severe--six or more. Your health care provider may perform a physical exam or use results from lab  tests to see if you have physical problems resulting from alcohol use. Your health care provider may refer you to a mental health professional for evaluation. TREATMENT  Some people with alcohol use disorder are able to reduce their alcohol use to low-risk levels. Some people with alcohol use disorder need to quit drinking alcohol. When necessary, mental health professionals with specialized training in substance use treatment can help. Your health care provider can help you decide how severe your alcohol use disorder is and what type of treatment you need. The following forms of treatment are available:   Detoxification.  Detoxification involves the use of prescription medicines to prevent alcohol withdrawal symptoms in the first week after quitting. This is important for people with a history of symptoms of withdrawal and for heavy drinkers who are likely to have withdrawal symptoms. Alcohol withdrawal can be dangerous and, in severe cases, cause death. Detoxification is usually provided in a hospital or in-patient substance use treatment facility.  Counseling or talk therapy. Talk therapy is provided by substance use treatment counselors. It addresses the reasons people use alcohol and ways to keep them from drinking again. The goals of talk therapy are to help people with alcohol use disorder find healthy activities and ways to cope with life stress, to identify and avoid triggers for alcohol use, and to handle cravings, which can cause relapse.  Medicines.Different medicines can help treat alcohol use disorder through the following actions:  Decrease alcohol cravings.  Decrease the positive reward response felt from alcohol use.  Produce an uncomfortable physical reaction when alcohol is used (aversion therapy).  Support groups. Support groups are run by people who have quit drinking. They provide emotional support, advice, and guidance. These forms of treatment are often combined. Some people with alcohol use disorder benefit from intensive combination treatment provided by specialized substance use treatment centers. Both inpatient and outpatient treatment programs are available.   This information is not intended to replace advice given to you by your health care provider. Make sure you discuss any questions you have with your health care provider.   Document Released: 10/11/2004 Document Revised: 09/24/2014 Document Reviewed: 12/11/2012 Elsevier Interactive Patient Education [employer]2016 Elsevier Inc.

## 2015-12-20 NOTE — ED Notes (Signed)
Lunch tray given to pt.

## 2015-12-20 NOTE — ED Notes (Signed)
Spoke with [facility]. They will be here within the next hour

## 2015-12-20 NOTE — ED Notes (Signed)
Pt was seen here earlier for forensic blood draw; [facility] due to [facility]TOH use; pt released under a unsecured bond in exchange for agreeance to come to ED for evaluation

## 2015-12-20 NOTE — ED Provider Notes (Addendum)
[facility] Emergency Department Provider Note  ____________________________________________  Time seen: Approximately 4:59 AM  I have reviewed the triage vital signs and the nursing notes.   HISTORY  Chief Complaint Alcohol Intoxication    HPI Kristopher Duran is a 50 y.o. male with a history of alcoholism who presents for detox.  He was arrested for driving under the influence and released on an unsecured bond if he agreed to seek treatment for alcoholism.  He reports that he drinks 5-6 40-ounce beers a day.  He states that he gets the shakes when he stops drinking but he has never had hallucinations or seizures.  He denies any other medical problems.  He also denies any other drug use or tobacco use.  He admits that his problem is gradual in onset but severe.  He currently complains of no medical issues.   Past Medical History  Diagnosis Date  . Alcohol abuse   . Hypertension     Patient Active Problem List   Diagnosis Date Noted  . Hernia of abdominal cavity 11/09/2015  . Alcohol abuse   . Essential hypertension 09/14/2015    Past Surgical History  Procedure Laterality Date  . Mandible fracture surgery      No current outpatient prescriptions on file.  Allergies Review of patient's allergies indicates no known allergies.  Family History  Problem Relation Age of Onset  . Diabetes type II Father   . Arthritis Mother     Social History Social History  Substance Use Topics  . Smoking status: Former Smoker    Quit date: 11/04/2011  . Smokeless tobacco: Never Used  . Alcohol Use: Yes     Comment: 5 x 40oz malt liquor daily    Review of Systems Constitutional: No fever/chills Eyes: No visual changes. ENT: No sore throat. Cardiovascular: Denies chest pain. Respiratory: Denies shortness of breath. Gastrointestinal: No abdominal pain.  No nausea, no vomiting.  No diarrhea.  No constipation. Genitourinary: Negative for  dysuria. Musculoskeletal: Negative for back pain. Skin: Negative for rash. Neurological: Negative for headaches, focal weakness or numbness. Psych:  Denies SI/HI  10-point ROS otherwise negative.  ____________________________________________   PHYSICAL EXAM:  VITAL SIGNS: ED Triage Vitals  Enc Vitals Group     BP 12/20/15 0123 160/103 mmHg     Pulse Rate 12/20/15 0123 96     Resp 12/20/15 0123 18     Temp 12/20/15 0123 97.7 F (36.5 C)     Temp Source 12/20/15 0123 Oral     SpO2 12/20/15 0123 94 %     Weight 12/20/15 0123 141 lb (63.957 kg)     Height 12/20/15 0123  (1.727 m)     Head Cir --      Peak Flow --      Pain Score --      Pain Loc --      Pain Edu? --      Excl. in GC? --     Constitutional: Alert and oriented. Well appearing and in no acute distress. Eyes: Conjunctivae are normal. PERRL. EOMI. Head: Atraumatic. Nose: No congestion/rhinnorhea. Mouth/Throat: Mucous membranes are moist.  Oropharynx non-erythematous. Neck: No stridor.  No meningeal signs.   Cardiovascular: Normal rate, regular rhythm. Good peripheral circulation. Grossly normal heart sounds.   Respiratory: Normal respiratory effort.  No retractions. Lungs CTAB. Gastrointestinal: Soft and nontender. No distention.  Musculoskeletal: No lower extremity tenderness nor edema. No gross deformities of extremities. Neurologic:  Normal speech and  language. No gross focal neurologic deficits are appreciated.  Skin:  Skin is warm, dry and intact. No rash noted. Psychiatric: Mood and affect are normal. Speech and behavior are normal.  Clinically sober in spite of elevated ethanol level  ____________________________________________   LABS (all labs ordered are listed, but only abnormal results are displayed)  Labs Reviewed  ETHANOL - Abnormal; Notable for the following:    Alcohol, Ethyl (B) 321 (*)    All other components within normal limits  COMPREHENSIVE METABOLIC PANEL - Abnormal; Notable  for the following:    Glucose, Bld 101 (*)    BUN <5 (*)    Calcium 8.8 (*)    AST 133 (*)    All other components within normal limits  URINE DRUG SCREEN, QUALITATIVE ([facility] ONLY)  CBC   ____________________________________________  EKG  None ____________________________________________  RADIOLOGY   No results found.  ____________________________________________   PROCEDURES  Procedure(s) performed: None  Critical Care performed: No ____________________________________________   INITIAL IMPRESSION / ASSESSMENT AND PLAN / ED COURSE  Pertinent labs & imaging results that were available during my care of the patient were reviewed by me and considered in my medical decision making (see chart for details).  Keisha ([facility]) spoke with [facility] who agreed to take the patient after his alcohol level is below 114.  Giving thiamine and folate and initiating CIWA protocol.  No other medical interventions required at this time.    Repeat ethanol ordered for 10:00am anticipating normal metabolism of approximately 25/hour. ____________________________________________  FINAL CLINICAL IMPRESSION(S) / ED DIAGNOSES  Final diagnoses:  Alcohol abuse  Alcohol intoxication, uncomplicated (HCC)      NEW MEDICATIONS STARTED DURING THIS VISIT:  New Prescriptions   No medications on file      Note:  This document was prepared using Dragon voice recognition software and may include unintentional dictation errors.   Loleta Roseory Montez Cuda, MD 12/20/15 [id number]  Loleta Roseory Ovida Delagarza, MD 12/20/15 [phone]

## 2015-12-20 NOTE — ED Notes (Signed)
Supper tray placed near pt. Pt did not want to eat right now.

## 2015-12-20 NOTE — ED Notes (Signed)
Breakfast tray placed at bedside. Pt stated he was not hungry.

## 2015-12-20 NOTE — ED Notes (Signed)
Patient accepted to [facility] waiting on transport

## 2015-12-20 NOTE — ED Notes (Signed)
Pt states he drinks 5-6 40's a day. The last time he drank was a couple hours ago. At this time CIWA is 0, but pt keeps stating that "it will come" referring to tremors, N&V, sweating, feeling anxious.

## 2015-12-20 NOTE — ED Notes (Signed)
Pt sleeping. 

## 2015-12-20 NOTE — ED Notes (Signed)
Pt here with [employer] after forced blood draw.  Pt left on unsecured bond on the condition that he receive help for alcoholism.  Pt reports detox before with shakes and sweats, denies seizures.  Pt appears with ETOH onboard.

## 2016-01-18 ENCOUNTER — Encounter: Payer: Self-pay | Admitting: Emergency Medicine

## 2016-01-18 ENCOUNTER — Emergency Department
Admission: EM | Admit: 2016-01-18 | Discharge: 2016-01-19 | Disposition: A | Discharge status: Home / Self Care | Payer: PRIVATE HEALTH INSURANCE | Attending: Emergency Medicine | Admitting: Emergency Medicine

## 2016-01-18 DIAGNOSIS — Z87891 Personal history of nicotine dependence: Secondary | ICD-10-CM | POA: Insufficient documentation

## 2016-01-18 DIAGNOSIS — I10 Essential (primary) hypertension: Secondary | ICD-10-CM | POA: Insufficient documentation

## 2016-01-18 DIAGNOSIS — F1010 Alcohol abuse, uncomplicated: Secondary | ICD-10-CM | POA: Insufficient documentation

## 2016-01-18 LAB — CBC
HEMATOCRIT: 41.2 % (ref 40.0–52.0)
Hemoglobin: 14.1 g/dL (ref 13.0–18.0)
MCH: 30.2 pg (ref 26.0–34.0)
MCHC: 34.3 g/dL (ref 32.0–36.0)
MCV: 88 fL (ref 80.0–100.0)
PLATELETS: 159 10*3/uL (ref 150–440)
RBC: 4.68 MIL/uL (ref 4.40–5.90)
RDW: 14.4 % (ref 11.5–14.5)
WBC: 5.2 10*3/uL (ref 3.8–10.6)

## 2016-01-18 LAB — COMPREHENSIVE METABOLIC PANEL
ALBUMIN: 4.6 g/dL (ref 3.5–5.0)
ALK PHOS: 84 U/L (ref 38–126)
ALT: 39 U/L (ref 17–63)
AST: 107 U/L — ABNORMAL HIGH (ref 15–41)
Anion gap: 14 (ref 5–15)
CO2: 22 mmol/L (ref 22–32)
CREATININE: 0.57 mg/dL — AB (ref 0.61–1.24)
Calcium: 8.9 mg/dL (ref 8.9–10.3)
Chloride: 98 mmol/L — ABNORMAL LOW (ref 101–111)
GFR calc Af Amer: >60 mL/min (ref >60)
GFR calc non Af Amer: >60 mL/min (ref >60)
GLUCOSE: 122 mg/dL — AB (ref 65–99)
Potassium: 3.6 mmol/L (ref 3.5–5.1)
SODIUM: 134 mmol/L — AB (ref 135–145)
Total Bilirubin: 0.7 mg/dL (ref 0.3–1.2)
Total Protein: 7.9 g/dL (ref 6.5–8.1)

## 2016-01-18 LAB — ETHANOL
ALCOHOL ETHYL (B): 163 mg/dL — AB (ref <5)
Alcohol, Ethyl (B): 427 mg/dL (ref <5)

## 2016-01-18 LAB — URINE DRUG SCREEN, QUALITATIVE (ARMC ONLY)
Amphetamines, Ur Screen: NOT DETECTED
BENZODIAZEPINE, UR SCRN: NOT DETECTED
Barbiturates, Ur Screen: NOT DETECTED
CANNABINOID 50 NG, UR ~~LOC~~: NOT DETECTED
Cocaine Metabolite,Ur ~~LOC~~: NOT DETECTED
MDMA (Ecstasy)Ur Screen: NOT DETECTED
Methadone Scn, Ur: NOT DETECTED
OPIATE, UR SCREEN: NOT DETECTED
PHENCYCLIDINE (PCP) UR S: NOT DETECTED
Tricyclic, Ur Screen: NOT DETECTED

## 2016-01-18 MED ORDER — LORAZEPAM 2 MG PO TABS: 0.0000 mg | ORAL_TABLET | Freq: Two times a day (BID) | ORAL | Status: DC

## 2016-01-18 MED ORDER — LORAZEPAM 2 MG PO TABS
0.0000 mg | ORAL_TABLET | Freq: Four times a day (QID) | ORAL | Status: DC
  Administered 2016-01-18 – 2016-01-19 (×2): 2 mg via ORAL
  Filled 2016-01-18 (×2): qty 1

## 2016-01-18 MED ORDER — LORAZEPAM 2 MG PO TABS: 0.0000 mg | ORAL_TABLET | Freq: Four times a day (QID) | ORAL | Status: DC

## 2016-01-18 NOTE — ED Notes (Signed)
Pt wants detox, sent from [facility]. Pt drank beer today. Pt appears intoxicated. Pt alert and oriented X4, active, cooperative, pt in NAD. RR even and unlabored, color WNL.  Denies SI or HI.

## 2016-01-18 NOTE — ED Notes (Signed)
TTS at bedside. 

## 2016-01-18 NOTE — ED Notes (Signed)
Pt sleeping. 

## 2016-01-18 NOTE — ED Provider Notes (Signed)
[facility] Emergency Department Provider Note  ____________________________________________  Time seen: Approximately 10:42 AM  I have reviewed the triage vital signs and the nursing notes.   HISTORY  Chief Complaint Addiction Problem    HPI Kristopher Duran is a 50 y.o. male reports she has a long history of alcohol abuse.  Patient reports he recently went through detox about 4 weeks ago. He started drinking again 2 weeks ago, has been drinking about 5, 40 ounce beers daily. Today's had 3, 40 ounce already.  He would like detox again. He denies any harmful ideations, suicidal ideation, hallucination, or other concern. He denies any recent medical issues. Denies any pain, fevers chills illness. No nausea vomiting area  Patient simply reports that he wants to reenter detox program.   Past Medical History  Diagnosis Date  . Alcohol abuse   . Hypertension     Patient Active Problem List   Diagnosis Date Noted  . Hernia of abdominal cavity 11/09/2015  . Alcohol abuse   . Essential hypertension 09/14/2015    Past Surgical History  Procedure Laterality Date  . Mandible fracture surgery      No current outpatient prescriptions on file.  Allergies Review of patient's allergies indicates no known allergies.  Family History  Problem Relation Age of Onset  . Diabetes type II Father   . Arthritis Mother     Social History Social History  Substance Use Topics  . Smoking status: Former Smoker    Quit date: 11/04/2011  . Smokeless tobacco: Never Used  . Alcohol Use: Yes     Comment: 5 x 40oz malt liquor daily    Review of Systems Constitutional: No fever/chills Eyes: No visual changes. ENT: No sore throat. Cardiovascular: Denies chest pain. Respiratory: Denies shortness of breath. Gastrointestinal: No abdominal pain.  No nausea, no vomiting.  No diarrhea.  No constipation. Genitourinary: Negative for dysuria. Musculoskeletal: Negative  for back pain. Skin: Negative for rash. Neurological: Negative for headaches, focal weakness or numbness.  10-point ROS otherwise negative.  ____________________________________________   PHYSICAL EXAM:  VITAL SIGNS: ED Triage Vitals  Enc Vitals Group     BP 01/18/16 1014 125/90 mmHg     Pulse Rate 01/18/16 1014 90     Resp 01/18/16 1014 18     Temp 01/18/16 1014 98.1 F (36.7 C)     Temp Source 01/18/16 1014 Oral     SpO2 01/18/16 1014 97 %     Weight 01/18/16 1014 140 lb (63.504 kg)     Height 01/18/16 1014 5\' 8"  (1.727 m)     Head Cir --      Peak Flow --      Pain Score 01/18/16 1015 0     Pain Loc --      Pain Edu? --      Excl. in GC? --    Constitutional: Alert and oriented. Slightly disheveled appearance. He is in no distress resting comfortably in the hall. Eyes: Conjunctivae are normal. PERRL. EOMI. Head: Atraumatic. Nose: No congestion/rhinnorhea. Mouth/Throat: Mucous membranes are moist.  Oropharynx non-erythematous. Neck: No stridor.   Cardiovascular: Normal rate, regular rhythm. Grossly normal heart sounds.  Good peripheral circulation. Respiratory: Normal respiratory effort.  No retractions. Lungs CTAB. Gastrointestinal: Soft and nontender. No distention.  Musculoskeletal: No lower extremity tenderness. Neurologic:  Just slightly slurred speech and language. No gross focal neurologic deficits are appreciated. Normal cranial nerves. Moves all extremities with 5 out of 5 strength. On close  testing he does have some mild upper extremity ataxia.  Skin:  Skin is warm, dry and intact. No rash noted. Psychiatric: Mood and affect are normal. Speech and behavior are normal. Denies any suicidal ideations, wanting to harm others, or hallucinations.  ____________________________________________   LABS (all labs ordered are listed, but only abnormal results are displayed)  Labs Reviewed  COMPREHENSIVE METABOLIC PANEL - Abnormal; Notable for the following:     Sodium 134 (*)    Chloride 98 (*)    Glucose, Bld 122 (*)    BUN <5 (*)    Creatinine, Ser 0.57 (*)    AST 107 (*)    All other components within normal limits  ETHANOL - Abnormal; Notable for the following:    Alcohol, Ethyl (B) 427 (*)    All other components within normal limits  CBC  URINE DRUG SCREEN, QUALITATIVE ([facility] ONLY)   ____________________________________________  EKG   ____________________________________________  RADIOLOGY   ____________________________________________   PROCEDURES  Procedure(s) performed: None  Critical Care performed: No  ____________________________________________   INITIAL IMPRESSION / ASSESSMENT AND PLAN / ED COURSE  Pertinent labs & imaging results that were available during my care of the patient were reviewed by me and considered in my medical decision making (see chart for details).  Patient presents for request for alcohol detox. Does have a previous history of withdrawals, as well as going through detox. He appears that he is slightly clinically intoxicated at this time. We will send alcohol level. He has no acute medical complaint, and his labs do not show evidence of acute concern.  We will await his alcohol level, and I have asked for a TTS consult.  ----------------------------------------- 2:25 PM on 01/18/2016 ----------------------------------------- Patient medically clear. Placed on CIWA while here, working to arrange detox. He is currently resting comfortably with a score of 0.  Ongoing care and disposition assigned to Dr. York Cerise at 3:20 PM. Awaiting TTS consult, patient remaining on withdrawal protocol. Anticipate attempt to place the patient to detox. He is not on involuntary commitment, he denies any psychosis symptoms.  ____________________________________________   FINAL CLINICAL IMPRESSION(S) / ED DIAGNOSES  Final diagnoses:  Alcohol abuse      Sharyn Creamer, MD 01/18/16 1520

## 2016-01-18 NOTE — ED Notes (Signed)
Alcohol detox, sent from [facility] , " I drank 3 40's today already" , denies SI or HI,

## 2016-01-18 NOTE — ED Notes (Signed)
Pt sleeping, chest rise even and unlabored, no diaphoresis noted, no tremors palpated.

## 2016-01-18 NOTE — Discharge Instructions (Signed)
You were seen in the emergency department for alcohol intoxication.  Please seek help from the recommended resources for assistance with your alcohol dependence.  If you have any thoughts of hurting herself or others, please call 911 or return to the emergency department.  Please avoid drug and alcohol use.  Never drive a vehicle or operate machinery while intoxicated.  Please go directly to [facility] for treatment assistance.   Alcohol Intoxication Alcohol intoxication occurs when the amount of alcohol that a person has consumed impairs his or her ability to mentally and physically function. Alcohol directly impairs the normal chemical activity of the brain. Drinking large amounts of alcohol can lead to changes in mental function and behavior, and it can cause many physical effects that can be harmful.  Alcohol intoxication can range in severity from mild to very severe. Various factors can affect the level of intoxication that occurs, such as the person's age, gender, weight, frequency of alcohol consumption, and the presence of other medical conditions (such as diabetes, seizures, or heart conditions). Dangerous levels of alcohol intoxication may occur when people drink large amounts of alcohol in a short period (binge drinking). Alcohol can also be especially dangerous when combined with certain prescription medicines or "recreational" drugs. SIGNS AND SYMPTOMS Some common signs and symptoms of mild alcohol intoxication include:  Loss of coordination.  Changes in mood and behavior.  Impaired judgment.  Slurred speech. As alcohol intoxication progresses to more severe levels, other signs and symptoms will appear. These may include:  Vomiting.  Confusion and impaired memory.  Slowed breathing.  Seizures.  Loss of consciousness. DIAGNOSIS  Your health care provider will take a medical history and perform a physical exam. You will be asked about the amount and type of alcohol you have consumed.  Blood tests will be done to measure the concentration of alcohol in your blood. In many places, your blood alcohol level must be lower than 80 mg/dL (9.60%) to legally drive. However, many dangerous effects of alcohol can occur at much lower levels.  TREATMENT  People with alcohol intoxication often do not require treatment. Most of the effects of alcohol intoxication are temporary, and they go away as the alcohol naturally leaves the body. Your health care provider will monitor your condition until you are stable enough to go home. Fluids are sometimes given through an IV access tube to help prevent dehydration.  HOME CARE INSTRUCTIONS  Do not drive after drinking alcohol.  Stay hydrated. Drink enough water and fluids to keep your urine clear or pale yellow. Avoid caffeine.   Only take over-the-counter or prescription medicines as directed by your health care provider.  SEEK MEDICAL CARE IF:   You have persistent vomiting.   You do not feel better after a few days.  You have frequent alcohol intoxication. Your health care provider can help determine if you should see a substance use treatment counselor. SEEK IMMEDIATE MEDICAL CARE IF:   You become shaky or tremble when you try to stop drinking.   You shake uncontrollably (seizure).   You throw up (vomit) blood. This may be bright red or may look like black coffee grounds.   You have blood in your stool. This may be bright red or may appear as a black, tarry, bad smelling stool.   You become lightheaded or faint.  MAKE SURE YOU:   Understand these instructions.  Will watch your condition.  Will get help right away if you are not doing well or get  worse. Document Released: 06/13/2005 Document Revised: 05/06/2013 Document Reviewed: 02/06/2013 [facility] Patient Information 2015 [place], [place]. This information is not intended to replace advice given to you by your health care provider. Make sure you discuss any questions you  have with your health care provider.  Finding Treatment for Alcohol and Drug Addiction It can be hard to find the right place to get professional treatment. Here are some important things to consider:  There are different types of treatment to choose from.  Some programs are live-in (residential) while others are not (outpatient). Sometimes a combination is offered.  No single type of program is right for everyone.  Most treatment programs involve a combination of education, counseling, and a 12-step, spiritually-based approach.  There are non-spiritually based programs (not 12-step).  Some treatment programs are government sponsored. They are geared for patients without private insurance.  Treatment programs can vary in many respects such as:  Cost and types of insurance accepted.  Types of on-site medical services offered.  Length of stay, setting, and size.  Overall philosophy of treatment. A person may need specialized treatment or have needs not addressed by all programs. For example, adolescents need treatment appropriate for their age. Other people have secondary disorders that must be managed as well. Secondary conditions can include mental illness, such as depression or diabetes. Often, a period of detoxification from alcohol or drugs is needed. This requires medical supervision and not all programs offer this. THINGS TO CONSIDER WHEN SELECTING A TREATMENT PROGRAM   Is the program certified by the appropriate government agency? Even private programs must be certified and employ certified professionals.  Does the program accept your insurance? If not, can a payment plan be set up?  Is the facility clean, organized, and well run? Do they allow you to speak with graduates who can share their treatment experience with you? Can you tour the facility? Can you meet with staff?  Does the program meet the full range of individual needs?  Does the treatment program address sexual  orientation and physical disabilities? Do they provide age, gender, and culturally appropriate treatment services?  Is treatment available in languages other than English?  Is long-term aftercare support or guidance encouraged and provided?  Is assessment of an individual's treatment plan ongoing to ensure it meets changing needs?  Does the program use strategies to encourage reluctant patients to remain in treatment long enough to increase the likelihood of success?  Does the program offer counseling (individual or group) and other behavioral therapies?  Does the program offer medicine as part of the treatment regimen, if needed?  Is there ongoing monitoring of possible relapse? Is there a defined relapse prevention program? Are services or referrals offered to family members to ensure they understand addiction and the recovery process? This would help them support the recovering individual.  Are 12-step meetings held at the center or is transport available for patients to attend outside meetings? In countries outside of the [place], Magazine features editorsee local directories for contact information for services in your area. Document Released: 08/02/2005 Document Revised: 11/26/2011 Document Reviewed: 02/12/2008 [facility] Patient Information 2015 [place], [place]. This information is not intended to replace advice given to you by your health care provider. Make sure you discuss any questions you have with your health care provider.

## 2016-01-18 NOTE — ED Notes (Signed)
Critical alcohol verbal readback to Dr Fanny BienQuale.

## 2016-01-18 NOTE — BH Assessment (Signed)
Assessment Note  Kristopher Duran is an 50 y.o. male who presents to the ER, seeking assistance with alcohol detox and treatment. Patient states he start drinking at the age of 76. His last drink was today. He drinks on a daily basis, in the amount of 5 to 6, 40oz beers. Patient was recently with [facility]. He was discharged from them, due to relapsing on Alcohol. That was his first time seeking treatment for his alcohol use. This took place approximately 4 months ago.  Patient reports of having a seizure approximately 2 years ago when he was detoxing. He was taking to jail and wasn't giving any medications.  Upcoming court date on 05/11/2016 for DWI and having an open container, DWLR and Driving vehicle with no insurance.  Patient is denying SI/HI and AV/H.  He has no history of aggression or violence.   Diagnosis: Alcohol Use Disorder, Severe  Past Medical History:  Past Medical History  Diagnosis Date  . Alcohol abuse   . Hypertension     Past Surgical History  Procedure Laterality Date  . Mandible fracture surgery      Family History:  Family History  Problem Relation Age of Onset  . Diabetes type II Father   . Arthritis Mother     Social History:  reports that he quit smoking about 4 years ago. He has never used smokeless tobacco. He reports that he drinks alcohol. He reports that he does not use illicit drugs.  Additional Social History:  Alcohol / Drug Use Pain Medications: See PTA Prescriptions: See PTA Over the Counter: See PTA History of alcohol / drug use?: Yes Longest period of sobriety (when/how long): 45 days Negative Consequences of Use: Personal relationships, Financial, Work / School Withdrawal Symptoms: Tremors, Nausea / Vomiting, Sweats Substance #1 Name of Substance 1: Alcohol 1 - Age of First Use: 24 1 - Amount (size/oz): 5 to 6, 40oz 1 - Frequency: Daily 1 - Duration: "Several Years" 1 - Last Use / Amount: 01/17/2016  CIWA:  CIWA-Ar BP: 125/68 mmHg Pulse Rate: 84 Nausea and Vomiting: no nausea and no vomiting Tactile Disturbances: none Tremor: no tremor Auditory Disturbances: not present Paroxysmal Sweats: no sweat visible Visual Disturbances: not present Anxiety: no anxiety, at ease Headache, Fullness in Head: none present Agitation: normal activity Orientation and Clouding of Sensorium: oriented and can do serial additions CIWA-Ar Total: 0 COWS:    Allergies: No Known Allergies  Home Medications:  (Not in a hospital admission)  OB/GYN Status:  No LMP for male patient.  General Assessment Data Location of Assessment: [facility] ED TTS Assessment: In system Is this a Tele or Face-to-Face Assessment?: Face-to-Face Is this an Initial Assessment or a Re-assessment for this encounter?: Initial Assessment Marital status: Single Maiden name: n/a Is patient pregnant?: No Living Arrangements: Alone, Other (Comment) (Homeless) Can pt return to current living arrangement?: Yes Admission Status: Voluntary Is patient capable of signing voluntary admission?: Yes Referral Source: Self/Family/Friend Insurance type: None  Medical Screening Exam [facility] Walk-in ONLY) Medical Exam completed: Yes  Crisis Care Plan Living Arrangements: Alone, Other (Comment) (Homeless) Legal Guardian: Other: (None) Name of Psychiatrist: Reports of none Name of Therapist: Reports of none  Education Status Is patient currently in school?: No Current Grade: n/a Highest grade of school patient has completed: [employer] Name of school: n/a Contact person: n/a  Risk to self with the past 6 months Suicidal Ideation: No Has patient been a risk to self within the past 6  months prior to admission? : No Suicidal Intent: No Has patient had any suicidal intent within the past 6 months prior to admission? : No Is patient at risk for suicide?: No Suicidal Plan?: No Has patient had any suicidal plan within the past 6 months prior to  admission? : No Access to Means: No What has been your use of drugs/alcohol within the last 12 months?: Alcohol Previous Attempts/Gestures: No How many times?: 0 Other Self Harm Risks: n/a Triggers for Past Attempts: None known Intentional Self Injurious Behavior: None Family Suicide History: No Recent stressful life event(s): Other (Comment) (Active Addiction) Persecutory voices/beliefs?: No Depression: Yes Depression Symptoms: Feeling worthless/self pity, Fatigue, Guilt Substance abuse history and/or treatment for substance abuse?: Yes Suicide prevention information given to non-admitted patients: Not applicable  Risk to Others within the past 6 months Homicidal Ideation: No Does patient have any lifetime risk of violence toward others beyond the six months prior to admission? : No Thoughts of Harm to Others: No Current Homicidal Intent: No Current Homicidal Plan: No Access to Homicidal Means: No Identified Victim: Reports of none History of harm to others?: No Assessment of Violence: None Noted Violent Behavior Description: Reports of none Does patient have access to weapons?: No Criminal Charges Pending?: Yes Describe Pending Criminal Charges: DWI Does patient have a court date: Yes Court Date: 05/10/16 Is patient on probation?: No  Psychosis Hallucinations: None noted Delusions: None noted  Mental Status Report Appearance/Hygiene: Unremarkable Eye Contact: Good Motor Activity: Freedom of movement, Unremarkable Speech: Logical/coherent Level of Consciousness: Restless Mood: Anxious, Pleasant Affect: Appropriate to circumstance Anxiety Level: Minimal Thought Processes: Coherent, Relevant Judgement: Partial Orientation: Person, Place, Time, Situation, Appropriate for developmental age Obsessive Compulsive Thoughts/Behaviors: Minimal  Cognitive Functioning Concentration: Normal Memory: Recent Intact, Remote Intact IQ: Average Insight: Fair Impulse Control:  Poor Appetite: Good Weight Loss: 0 Weight Gain: 0 Sleep: No Change Total Hours of Sleep: 8 Vegetative Symptoms: None  ADLScreening [facility] Assessment Services) Patient's cognitive ability adequate to safely complete daily activities?: Yes Patient able to express need for assistance with ADLs?: Yes Independently performs ADLs?: Yes (appropriate for developmental age)  Prior Inpatient Therapy Prior Inpatient Therapy: Yes Prior Therapy Dates: 10/2015 Prior Therapy Facilty/Provider(s): [facility] Reason for Treatment: Alcohol Treatment/Residential   Prior Outpatient Therapy Prior Outpatient Therapy: No Prior Therapy Dates: Reports of none Prior Therapy Facilty/Provider(s): Reports of none Reason for Treatment: Reports of none Does patient have an ACCT team?: No Does patient have Intensive In-House Services?  : No Does patient have [facility] services? : No Does patient have P4CC services?: No  ADL Screening (condition at time of admission) Patient's cognitive ability adequate to safely complete daily activities?: Yes Is the patient deaf or have difficulty hearing?: No Does the patient have difficulty seeing, even when wearing glasses/contacts?: No Does the patient have difficulty concentrating, remembering, or making decisions?: No Patient able to express need for assistance with ADLs?: Yes Does the patient have difficulty dressing or bathing?: No Independently performs ADLs?: Yes (appropriate for developmental age) Does the patient have difficulty walking or climbing stairs?: No Weakness of Legs: None Weakness of Arms/Hands: None  Home Assistive Devices/Equipment Home Assistive Devices/Equipment: None  Therapy Consults (therapy consults require a physician order) PT Evaluation Needed: No OT Evalulation Needed: No SLP Evaluation Needed: No Abuse/Neglect Assessment (Assessment to be complete while patient is alone) Physical Abuse: Denies Verbal Abuse: Denies Sexual Abuse:  Denies Exploitation of patient/patient's resources: Denies Self-Neglect: Denies Values / Beliefs Cultural Requests During Hospitalization:  None Spiritual Requests During Hospitalization: None Consults Spiritual Care Consult Needed: No Social Work Consult Needed: No Merchant navy officerAdvance Directives (For Healthcare) Does patient have an advance directive?: No Would patient like information on creating an advanced directive?: No - patient declined information    Additional Information 1:1 In Past 12 Months?: No CIRT Risk: No Elopement Risk: No Does patient have medical clearance?: Yes  Child/Adolescent Assessment Running Away Risk: Denies (Patient is an adult)  Disposition:  Disposition Initial Assessment Completed for this Encounter: Yes Disposition of Patient: Referred to Patient referred to: [facility]  On Site Evaluation by:   Reviewed with Physician:    Lilyan Gilfordalvin J. Nirvan Laban MS, LCAS, LPC, NCC, CCSI Therapeutic Triage Specialist 01/18/2016 3:29 PM

## 2016-01-18 NOTE — ED Notes (Signed)
Pt up to toilet 

## 2016-01-18 NOTE — ED Notes (Signed)
Pt up to bathroom. Pt given drink and crackers.

## 2016-01-18 NOTE — ED Provider Notes (Signed)
-----------------------------------------   3:10 PM on 01/18/2016 -----------------------------------------   Blood pressure 125/68, pulse 84, temperature 98.1 F (36.7 C), temperature source Oral, resp. rate 18, height 5\' 8"  (1.727 m), weight 63.504 kg, SpO2 98 %.  Assuming care from Dr. Fanny BienQuale.  In short, Kristopher Duran is a 50 y.o. male with a chief complaint of Addiction Problem .  Refer to the original H&P for additional details.  The current plan of care is to recheck the patient's alcohol level at around 10:00 PM to see if it is low enough for him to go to [facility]. (It needs to be less than 100).  ----------------------------------------- 11:49 PM on 01/18/2016 -----------------------------------------  Unfortunately the repeat ethanol level at 22:24 was 163.  This means he needs approximately another 3 hours for the alcohol to drop below 100.  He remains on CIWA protocol.  I have ordered a repeat lab draw and am transferring care to Dr. Manson PasseyBrown.  Kristopher Roseory Antionette Luster, MD 01/18/16 2350

## 2016-01-18 NOTE — BH Assessment (Signed)
Writer discussed with the patient about the option of [facility].  Patient was in the agreement with the plan.  Patient signed the Release of information, in order to send labs and ER Note.  Information sent sent to [facility] via fax and confirmed it was received ([phone]).  Patient will be able to go to [facility] when his BAC is 100 or below.

## 2016-01-18 NOTE — ED Notes (Signed)
Pt up and eating dinner tray in stretcher.

## 2016-01-19 LAB — ETHANOL: ALCOHOL ETHYL (B): 79 mg/dL — AB (ref <5)

## 2016-01-19 NOTE — ED Notes (Signed)

## 2016-01-19 NOTE — ED Provider Notes (Signed)
Assumed care of the patient from Dr.Forbach at 11:50 PM. Patient's repeat alcohol level 79 as such [facility] notified and we were informed that they would be here to pick the patient up at 4:00 AM.  Kristopher Currentandolph N Keith Cancio, MD 01/19/16 [phone]

## 2016-01-19 NOTE — ED Notes (Signed)
Pt assisted to bathroom at this time with assist of 1, pt tolerated well, NAD noted.

## 2016-02-14 ENCOUNTER — Ambulatory Visit: Payer: Self-pay | Admitting: Family Medicine

## 2016-02-14 VITALS — BP 115/70 | HR 76 | Temp 98.1°F | Wt 150.0 lb

## 2016-02-14 DIAGNOSIS — R600 Localized edema: Secondary | ICD-10-CM

## 2016-02-14 DIAGNOSIS — F1021 Alcohol dependence, in remission: Secondary | ICD-10-CM

## 2016-02-14 DIAGNOSIS — K4090 Unilateral inguinal hernia, without obstruction or gangrene, not specified as recurrent: Secondary | ICD-10-CM

## 2016-02-14 NOTE — Assessment & Plan Note (Signed)
Trace pedal edema; not bad enough to warrant furosemide; encouraged limiting salt intake, elevate feet if some swelling occurs

## 2016-02-14 NOTE — Patient Instructions (Addendum)
We'll have you see the general surgeon as soon as possible If you hernia gets hard or painful or won't go back down, then go to the ER immediately  Hernia, Adult A hernia is the bulging of an organ or tissue through a weak spot in the muscles of the abdomen (abdominal wall). Hernias develop most often near the navel or groin. There are many kinds of hernias. Common kinds include:  Femoral hernia. This kind of hernia develops under the groin in the upper thigh area.  Inguinal hernia. This kind of hernia develops in the groin or scrotum.  Umbilical hernia. This kind of hernia develops near the navel.  Hiatal hernia. This kind of hernia causes part of the stomach to be pushed up into the chest.  Incisional hernia. This kind of hernia bulges through a scar from an abdominal surgery. CAUSES This condition may be caused by:  Heavy lifting.  Coughing over a long period of time.  Straining to have a bowel movement.  An incision made during an abdominal surgery.  A birth defect (congenital defect).  Excess weight or obesity.  Smoking.  Poor nutrition.  Cystic fibrosis.  Excess fluid in the abdomen.  Undescended testicles. SYMPTOMS Symptoms of a hernia include:  A lump on the abdomen. This is the first sign of a hernia. The lump may become more obvious with standing, straining, or coughing. It may get bigger over time if it is not treated or if the condition causing it is not treated.  Pain. A hernia is usually painless, but it may become painful over time if treatment is delayed. The pain is usually dull and may get worse with standing or lifting heavy objects. Sometimes a hernia gets tightly squeezed in the weak spot (strangulated) or stuck there (incarcerated) and causes additional symptoms. These symptoms may include:  Vomiting.  Nausea.  Constipation.  Irritability. DIAGNOSIS A hernia may be diagnosed with:  A physical exam. During the exam your health care  provider may ask you to cough or to make a specific movement, because a hernia is usually more visible when you move.  Imaging tests. These can include:  X-rays.  Ultrasound.  CT scan. TREATMENT A hernia that is small and painless may not need to be treated. A hernia that is large or painful may be treated with surgery. Inguinal hernias may be treated with surgery to prevent incarceration or strangulation. Strangulated hernias are always treated with surgery, because lack of blood to the trapped organ or tissue can cause it to die. Surgery to treat a hernia involves pushing the bulge back into place and repairing the weak part of the abdomen. HOME CARE INSTRUCTIONS  Avoid straining.  Do not lift anything heavier than 10 lb (4.5 kg).  Lift with your leg muscles, not your back muscles. This helps avoid strain.  When coughing, try to cough gently.  Prevent constipation. Constipation leads to straining with bowel movements, which can make a hernia worse or cause a hernia repair to break down. You can prevent constipation by:  Eating a high-fiber diet that includes plenty of fruits and vegetables.  Drinking enough fluids to keep your urine clear or pale yellow. Aim to drink 6-8 glasses of water per day.  Using a stool softener as directed by your health care provider.  Lose weight, if you are overweight.  Do not use any tobacco products, including cigarettes, chewing tobacco, or electronic cigarettes. If you need help quitting, ask your health care provider.  Keep  all follow-up visits as directed by your health care provider. This is important. Your health care provider may need to monitor your condition. SEEK MEDICAL CARE IF:  You have swelling, redness, and pain in the affected area.  Your bowel habits change. SEEK IMMEDIATE MEDICAL CARE IF:  You have a fever.  You have abdominal pain that is getting worse.  You feel nauseous or you vomit.  You cannot push the hernia back  in place by gently pressing on it while you are lying down.  The hernia:  Changes in shape or size.  Is stuck outside the abdomen.  Becomes discolored.  Feels hard or tender.   This information is not intended to replace advice given to you by your health care provider. Make sure you discuss any questions you have with your health care provider.   Document Released: 09/03/2005 Document Revised: 09/24/2014 Document Reviewed: 07/14/2014 Elsevier Interactive Patient Education 2016 Elsevier Inc. Inguinal Hernia, Adult Muscles help keep everything in the body in its proper place. But if a weak spot in the muscles develops, something can poke through. That is called a hernia. When this happens in the lower part of the belly (abdomen), it is called an inguinal hernia. (It takes its name from a part of the body in this region called the inguinal canal.) A weak spot in the wall of muscles lets some fat or part of the small intestine bulge through. An inguinal hernia can develop at any age. Men get them more often than women. CAUSES  In adults, an inguinal hernia develops over time.  It can be triggered by:  Suddenly straining the muscles of the lower abdomen.  Lifting heavy objects.  Straining to have a bowel movement. Difficult bowel movements (constipation) can lead to this.  Constant coughing. This may be caused by smoking or lung disease.  Being overweight.  Being pregnant.  Working at a job that requires long periods of standing or heavy lifting.  Having had an inguinal hernia before. One type can be an emergency situation. It is called a strangulated inguinal hernia. It develops if part of the small intestine slips through the weak spot and cannot get back into the abdomen. The blood supply can be cut off. If that happens, part of the intestine may die. This situation requires emergency surgery. SYMPTOMS  Often, a small inguinal hernia has no symptoms. It is found when a  healthcare provider does a physical exam. Larger hernias usually have symptoms.   In adults, symptoms may include:  A lump in the groin. This is easier to see when the person is standing. It might disappear when lying down.  In men, a lump in the scrotum.  Pain or burning in the groin. This occurs especially when lifting, straining or coughing.  A dull ache or feeling of pressure in the groin.  Signs of a strangulated hernia can include:  A bulge in the groin that becomes very painful and tender to the touch.  A bulge that turns red or purple.  Fever, nausea and vomiting.  Inability to have a bowel movement or to pass gas. DIAGNOSIS  To decide if you have an inguinal hernia, a healthcare provider will probably do a physical examination.  This will include asking questions about any symptoms you have noticed.  The healthcare provider might feel the groin area and ask you to cough. If an inguinal hernia is felt, the healthcare provider may try to slide it back into the abdomen.  Usually no other tests are needed. TREATMENT  Treatments can vary. The size of the hernia makes a difference. Options include:  Watchful waiting. This is often suggested if the hernia is small and you have had no symptoms.  No medical procedure will be done unless symptoms develop.  You will need to watch closely for symptoms. If any occur, contact your healthcare provider right away.  Surgery. This is used if the hernia is larger or you have symptoms.  Open surgery. This is usually an outpatient procedure (you will not stay overnight in a hospital). An cut (incision) is made through the skin in the groin. The hernia is put back inside the abdomen. The weak area in the muscles is then repaired by herniorrhaphy or hernioplasty. Herniorrhaphy: in this type of surgery, the weak muscles are sewn back together. Hernioplasty: a patch or mesh is used to close the weak area in the abdominal wall.  Laparoscopy.  In this procedure, a surgeon makes small incisions. A thin tube with a tiny video camera (called a laparoscope) is put into the abdomen. The surgeon repairs the hernia with mesh by looking with the video camera and using two long instruments. HOME CARE INSTRUCTIONS   After surgery to repair an inguinal hernia:  You will need to take pain medicine prescribed by your healthcare provider. Follow all directions carefully.  You will need to take care of the wound from the incision.  Your activity will be restricted for awhile. This will probably include no heavy lifting for several weeks. You also should not do anything too active for a few weeks. When you can return to work will depend on the type of job that you have.  During "watchful waiting" periods, you should:  Maintain a healthy weight.  Eat a diet high in fiber (fruits, vegetables and whole grains).  Drink plenty of fluids to avoid constipation. This means drinking enough water and other liquids to keep your urine clear or pale yellow.  Do not lift heavy objects.  Do not stand for long periods of time.  Quit smoking. This should keep you from developing a frequent cough. SEEK MEDICAL CARE IF:   A bulge develops in your groin area.  You feel pain, a burning sensation or pressure in the groin. This might be worse if you are lifting or straining.  You develop a fever of more than 100.5 F (38.1 C). SEEK IMMEDIATE MEDICAL CARE IF:   Pain in the groin increases suddenly.  A bulge in the groin gets bigger suddenly and does not go down.  For men, there is sudden pain in the scrotum. Or, the size of the scrotum increases.  A bulge in the groin area becomes red or purple and is painful to touch.  You have nausea or vomiting that does not go away.  You feel your heart beating much faster than normal.  You cannot have a bowel movement or pass gas.  You develop a fever of more than 102.0 F (38.9 C).   This information is  not intended to replace advice given to you by your health care provider. Make sure you discuss any questions you have with your health care provider.   Document Released: 01/20/2009 Document Revised: 11/26/2011 Document Reviewed: 03/07/2015 Elsevier Interactive Patient Education [employer] Elsevier Inc.

## 2016-02-14 NOTE — Progress Notes (Signed)
BP 115/70 mmHg  Pulse 76  Temp(Src) 98.1 F (36.7 C)  Wt 150 lb (68.04 kg)   Subjective:    Patient ID: Kristopher Duran, male    DOB: 08/21/1966, 50 y.o.   MRN: [id number]  HPI: Kristopher Duran is a 50 y.o. male  Chief Complaint  Patient presents with  . Hernia    history of hernia    He has a hernia lower abdomen; 10 years duration; soft, reducible He was referred to a surgeon when he was in [place] but has relocated and needs to be able to see someone here locally Normal BMs; no blood or mucous No fevers No nausea or vomiting It will come out and it's uncomfortable; then lays down and everything will go back down No hernia in the family When I prepared to examine him, he said it's not there today; when it sticks out, it looks like a third testicle above the left testicle; he did not seem willing to let me examine him  He is in a residential treatment program for alcoholism; he is 35 days clean and sober; doing well Feet swell up sometimes, used to take lasix or something similar He had HTN in his history but he does not take any medicine, and it didn't sound like he ever took anything; this may have been an entry error or misunderstanding  Relevant past medical, surgical, family and social history reviewed Past Medical History  Diagnosis Date  . Alcohol dependence in remission [facility])     is at [facility] and sober for 28 days    Past Surgical History  Procedure Laterality Date  . Mandible fracture surgery    he denies any hx of problems related to surgery  Family History  Problem Relation Age of Onset  . Diabetes type II Father   . Arthritis Mother    Social History  Substance Use Topics  . Smoking status: Former Smoker    Quit date: 11/04/2011  . Smokeless tobacco: Never Used  . Alcohol Use: No     Comment: used to drink five 40 ounce bottles of malt liquor a day; sober May 2017   Interim medical history since last visit reviewed. Allergies and medications  reviewed  Review of Systems Per HPI unless specifically indicated above     Objective:    BP 115/70 mmHg  Pulse 76  Temp(Src) 98.1 F (36.7 C)  Wt 150 lb (68.04 kg)  Wt Readings from Last 3 Encounters:  02/14/16 150 lb (68.04 kg)  01/18/16 140 lb (63.504 kg)  12/20/15 141 lb (63.957 kg)    Physical Exam  Constitutional: He appears well-developed and well-nourished.  Cardiovascular: Normal rate.   Pulmonary/Chest: Effort normal.  Abdominal: He exhibits no distension.  Musculoskeletal: He exhibits no edema (very trace pedal edema, nonpitting).  Neurological: He is alert.  Skin: No pallor.  No jaundice  Psychiatric: He has a normal mood and affect. His mood appears not anxious. He does not exhibit a depressed mood.  Very pleasant, cooperative    Results for orders placed or performed during the hospital encounter of 01/18/16  Comprehensive metabolic panel  Result Value Ref Range   Sodium 134 (L) 135 - 145 mmol/L   Potassium 3.6 3.5 - 5.1 mmol/L   Chloride 98 (L) 101 - 111 mmol/L   CO2 22 22 - 32 mmol/L   Glucose, Bld 122 (H) 65 - 99 mg/dL   BUN <5 (L) 6 - 20 mg/dL   Creatinine,  Ser 0.57 (L) 0.61 - 1.24 mg/dL   Calcium 8.9 8.9 - 16.110.3 mg/dL   Total Protein 7.9 6.5 - 8.1 g/dL   Albumin 4.6 3.5 - 5.0 g/dL   AST 096107 (H) 15 - 41 U/L   ALT 39 17 - 63 U/L   Alkaline Phosphatase 84 38 - 126 U/L   Total Bilirubin 0.7 0.3 - 1.2 mg/dL   GFR calc non Af Amer >60 >60 mL/min   GFR calc Af Amer >60 >60 mL/min   Anion gap 14 5 - 15  Ethanol  Result Value Ref Range   Alcohol, Ethyl (B) 427 (HH) <5 mg/dL  cbc  Result Value Ref Range   WBC 5.2 3.8 - 10.6 K/uL   RBC 4.68 4.40 - 5.90 MIL/uL   Hemoglobin 14.1 13.0 - 18.0 g/dL   HCT 04.541.2 40.940.0 - 81.152.0 %   MCV 88.0 80.0 - 100.0 fL   MCH 30.2 26.0 - 34.0 pg   MCHC 34.3 32.0 - 36.0 g/dL   RDW 91.414.4 78.211.5 - 95.614.5 %   Platelets 159 150 - 440 K/uL  Urine Drug Screen, Qualitative  Result Value Ref Range   Tricyclic, Ur Screen NONE  DETECTED NONE DETECTED   Amphetamines, Ur Screen NONE DETECTED NONE DETECTED   MDMA (Ecstasy)Ur Screen NONE DETECTED NONE DETECTED   Cocaine Metabolite,Ur [place] NONE DETECTED NONE DETECTED   Opiate, Ur Screen NONE DETECTED NONE DETECTED   Phencyclidine (PCP) Ur S NONE DETECTED NONE DETECTED   Cannabinoid 50 Ng, Ur  NONE DETECTED NONE DETECTED   Barbiturates, Ur Screen NONE DETECTED NONE DETECTED   Benzodiazepine, Ur Scrn NONE DETECTED NONE DETECTED   Methadone Scn, Ur NONE DETECTED NONE DETECTED  Ethanol  Result Value Ref Range   Alcohol, Ethyl (B) 163 (H) <5 mg/dL  Ethanol  Result Value Ref Range   Alcohol, Ethyl (B) 79 (H) <5 mg/dL      Assessment & Plan:   Problem List Items Addressed This Visit      Other   Alcohol dependence in remission (HCC)    So happy for him to be in recovery center; encouragement given      Hernia of abdominal cavity - Primary    Refer to general surgeon; reasons to go to the ER reviewed      Relevant Orders   Ambulatory referral to General Surgery   Pedal edema    Trace pedal edema; not bad enough to warrant furosemide; encouraged limiting salt intake, elevate feet if some swelling occurs         Follow up plan: No Follow-up on file.  An after-visit summary was printed and given to the patient at check-out.  Please see the patient instructions which may contain other information and recommendations beyond what is mentioned above in the assessment and plan.  Orders Placed This Encounter  Procedures  . Ambulatory referral to General Surgery

## 2016-02-14 NOTE — Assessment & Plan Note (Signed)
So happy for him to be in recovery center; encouragement given

## 2016-02-14 NOTE — Assessment & Plan Note (Signed)
Refer to general surgeon; reasons to go to the ER reviewed

## 2016-03-07 ENCOUNTER — Encounter: Payer: Self-pay | Admitting: Surgery

## 2016-03-07 ENCOUNTER — Ambulatory Visit (INDEPENDENT_AMBULATORY_CARE_PROVIDER_SITE_OTHER): Payer: Self-pay | Admitting: Surgery

## 2016-03-07 ENCOUNTER — Telehealth: Payer: Self-pay | Admitting: Surgery

## 2016-03-07 VITALS — BP 134/83 | HR 85 | Temp 97.8°F | Ht 68.0 in | Wt 156.2 lb

## 2016-03-07 DIAGNOSIS — K4090 Unilateral inguinal hernia, without obstruction or gangrene, not specified as recurrent: Secondary | ICD-10-CM

## 2016-03-07 NOTE — Patient Instructions (Signed)
You have chose to have your hernia repaired. This will be done by Dr[facility] Everlene Duran [facility]03/[facility]  Please see your (blue) Pre-care information that you have been given today.  You will need to arrange to be out of work for 2 weeks and then return with a lifting restrictions for 4 more weeks. Please send any FMLA paperwork prior to surgery and we will fill this out and fax it back to your employer within 3 business days.  You may have a bruise in your groin and also swelling and brusing in your testicle area. You may use ice 4-5 times daily for 15-20 minutes each time. Make sure that you place a barrier between you and the ice pack.  Inguinal Hernia, Adult Muscles help keep everything in the body in its proper place. But if a weak spot in the muscles develops, something can poke through. That is called a hernia. When this happens in the lower part of the belly (abdomen), it is called an inguinal hernia. (It takes its name from a part of the body in this region called the inguinal canal.) A weak spot in the wall of muscles lets some fat or part of the small intestine bulge through. An inguinal hernia can develop at any age. Men get them more often than women. CAUSES  In adults, an inguinal hernia develops over time.  It can be triggered by:  Suddenly straining the muscles of the lower abdomen.  Lifting heavy objects.  Straining to have a bowel movement. Difficult bowel movements (constipation) can lead to this.  Constant coughing. This may be caused by smoking or lung disease.  Being overweight.  Being pregnant.  Working at a job that requires long periods of standing or heavy lifting.  Having had an inguinal hernia before. One type can be an emergency situation. It is called a strangulated inguinal hernia. It develops if part of the small intestine slips through the weak spot and cannot get back into the abdomen. The blood supply can be cut off. If that happens, part of the intestine may  die. This situation requires emergency surgery. SYMPTOMS  Often, a small inguinal hernia has no symptoms. It is found when a healthcare provider does a physical exam. Larger hernias usually have symptoms.   In adults, symptoms may include:  A lump in the groin. This is easier to see when the person is standing. It might disappear when lying down.  In men, a lump in the scrotum.  Pain or burning in the groin. This occurs especially when lifting, straining or coughing.  A dull ache or feeling of pressure in the groin.  Signs of a strangulated hernia can include:  A bulge in the groin that becomes very painful and tender to the touch.  A bulge that turns red or purple.  Fever, nausea and vomiting.  Inability to have a bowel movement or to pass gas. DIAGNOSIS  To decide if you have an inguinal hernia, a healthcare provider will probably do a physical examination.  This will include asking questions about any symptoms you have noticed.  The healthcare provider might feel the groin area and ask you to cough. If an inguinal hernia is felt, the healthcare provider may try to slide it back into the abdomen.  Usually no other tests are needed. TREATMENT  Treatments can vary. The size of the hernia makes a difference. Options include:  Watchful waiting. This is often suggested if the hernia is small and you  have had no symptoms.  No medical procedure will be done unless symptoms develop.  You will need to watch closely for symptoms. If any occur, contact your healthcare provider right away.  Surgery. This is used if the hernia is larger or you have symptoms.  Open surgery. This is usually an outpatient procedure (you will not stay overnight in a hospital). An cut (incision) is made through the skin in the groin. The hernia is put back inside the abdomen. The weak area in the muscles is then repaired by herniorrhaphy or hernioplasty. Herniorrhaphy: in this type of surgery, the weak  muscles are sewn back together. Hernioplasty: a patch or mesh is used to close the weak area in the abdominal wall.  Laparoscopy. In this procedure, a surgeon makes small incisions. A thin tube with a tiny video camera (called a laparoscope) is put into the abdomen. The surgeon repairs the hernia with mesh by looking with the video camera and using two long instruments. HOME CARE INSTRUCTIONS   After surgery to repair an inguinal hernia:  You will need to take pain medicine prescribed by your healthcare provider. Follow all directions carefully.  You will need to take care of the wound from the incision.  Your activity will be restricted for awhile. This will probably include no heavy lifting for several weeks. You also should not do anything too active for a few weeks. When you can return to work will depend on the type of job that you have.  During "watchful waiting" periods, you should:  Maintain a healthy weight.  Eat a diet high in fiber (fruits, vegetables and whole grains).  Drink plenty of fluids to avoid constipation. This means drinking enough water and other liquids to keep your urine clear or pale yellow.  Do not lift heavy objects.  Do not stand for long periods of time.  Quit smoking. This should keep you from developing a frequent cough. SEEK MEDICAL CARE IF:   A bulge develops in your groin area.  You feel pain, a burning sensation or pressure in the groin. This might be worse if you are lifting or straining.  You develop a fever of more than 100.5 F (38.1 C). SEEK IMMEDIATE MEDICAL CARE IF:   Pain in the groin increases suddenly.  A bulge in the groin gets bigger suddenly and does not go down.  For men, there is sudden pain in the scrotum. Or, the size of the scrotum increases.  A bulge in the groin area becomes red or purple and is painful to touch.  You have nausea or vomiting that does not go away.  You feel your heart beating much faster than  normal.  You cannot have a bowel movement or pass gas.  You develop a fever of more than 102.0 F (38.9 C).   This information is not intended to replace advice given to you by your health care provider. Make sure you discuss any questions you have with your health care provider.   Document Released: 01/20/2009 Document Revised: 11/26/2011 Document Reviewed: 03/07/2015 Elsevier Interactive Patient Education [employer].

## 2016-03-07 NOTE — Telephone Encounter (Signed)
Pt advised of pre op date/time and sx date. Sx: 04/03/16 with Dr Pabon--Laparoscopic left inguinal hernia repair. Pre op: 03/27/16 between 9-1:00pm--Phone.   Patient made aware to call [phone], between 1-3:00pm the day before surgery, to find out what time to arrive.

## 2016-03-07 NOTE — Progress Notes (Addendum)
Patient ID: Kristopher Duran, male   DOB: 08-Nov-1965, 50 y.o.   MRN: [id number]  History of Present Illness Kristopher Duran is a 50 y.o. male with history of a symptomatic left inguinal hernia. He reports his had it for more than 10-15 years but over the last couple months now has been having some intermittent left sharp inguinal pain moderate in intensity. And he also has noticed a bulge on the left inguinal area that is bigger than before. Symptoms worsens with a Valsalva No previous repairs. He is an alcoholic and has been on remission rehabilitation the last rate was 2 months ago. He doesn't take any medications and he's got good cardiovascular reserve. He is able to perform more than 4 Mets of activity without any shortness of breath or chest pain  Past Medical History ETOH ABUSE    Past Surgical History  Procedure Laterality Date  . Mandible fracture surgery      No Known Allergies  No current outpatient prescriptions on file.   No current facility-administered medications for this visit.    Family History Family History  Problem Relation Age of Onset  . Diabetes type II Father   . Diabetes Father   . Arthritis Mother      Social History Social History  Substance Use Topics  . Smoking status: Former Smoker    Quit date: 11/04/2011  . Smokeless tobacco: Never Used  . Alcohol Use: No     Comment: used to drink five 40 ounce bottles of malt liquor a day; sober May 2017       ROS 10 pt ROS was performance otherwise negative  Physical Exam Blood pressure 134/83, pulse 85, temperature 97.8 F (36.6 C), temperature source Oral, height  (1.727 m), weight 70.852 kg (156 lb 3.2 oz).  CONSTITUTIONAL: NAD EYES: Pupils equal, round, and reactive to light, Sclera non-icteric. EARS, NOSE, MOUTH AND THROAT: The oropharynx is clear. Oral mucosa is pink and moist. Hearing is intact to voice.  NECK: Trachea is midline, and there is no jugular venous distension. Thyroid is  without palpable abnormalities. LYMPH NODES:  Lymph nodes in the neck are not enlarged. RESPIRATORY:  Lungs are clear, and breath sounds are equal bilaterally. Normal respiratory effort without pathologic use of accessory muscles. CARDIOVASCULAR: Heart is regular without murmurs, gallops, or rubs. GI: The abdomen is  soft, nontender, and nondistended. There were no palpable masses. There was no hepatosplenomegaly. There were normal bowel sounds. reducible left inguinal hernia with some tenderness to palpation GU: No testicular of penile lesions MUSCULOSKELETAL:  Normal muscle strength and tone in all four extremities.    SKIN: Skin turgor is normal. There are no pathologic skin lesions.  NEUROLOGIC:  Motor and sensation is grossly normal.  Cranial nerves are grossly intact. PSYCH:  Alert and oriented to person, place and time. Affect is normal.  Data Reviewed   I have personally reviewed the patient's imaging and medical records.    Assessment/Plan Symptomatic left inguinal hernia. Discussed with the patient in detail about his disease process. Given his symptoms I do recommend repair. Discussed with the patient about options of open versus laparoscopically. He chooses to have a laparoscopic repair.I have explained the procedure, risks, and aftercare of inguinal hernia repair to Fortino Sic.   Risks include but are not limited to bleeding, infection, wound problems, anesthesia, recurrence, bladder or intestine injury, urinary retention, testicular dysfAMEIR FARIAonic pain, mesh problems.  He  seems to understand and agrees to  proceed.  Questions were answered to his stated satisfaction.   Simon Aaberg, MD FACS  Dillon Mcreynolds F Syon Tews 03/07/2016, 9:37 AM

## 2016-03-27 ENCOUNTER — Inpatient Hospital Stay: Admission: RE | Admit: 2016-03-27 | Payer: PRIVATE HEALTH INSURANCE | Source: Ambulatory Visit

## 2016-03-27 ENCOUNTER — Telehealth: Payer: Self-pay | Admitting: Surgery

## 2016-03-27 NOTE — Telephone Encounter (Signed)
Patient Kristopher Duran has called and canceled sx for 04/03/16- lap inguinal hernia repair. He states that he is in a difficult situation at this time and does not have a place to stay at and recover. He will call back at a later time to reschedule. I have informed OR and pre admit as well as Surgeon--Dr Everlene FarrierPabon.

## 2016-04-03 ENCOUNTER — Ambulatory Visit: Admission: RE | Admit: 2016-04-03 | Payer: PRIVATE HEALTH INSURANCE | Source: Ambulatory Visit | Admitting: Surgery

## 2016-04-03 ENCOUNTER — Encounter: Admission: RE | Payer: Self-pay | Source: Ambulatory Visit

## 2016-04-03 SURGERY — REPAIR, HERNIA, INGUINAL, LAPAROSCOPIC
Anesthesia: General | Laterality: Left

## 2018-03-30 ENCOUNTER — Emergency Department
Admission: EM | Admit: 2018-03-30 | Discharge: 2018-03-30 | Payer: PRIVATE HEALTH INSURANCE | Attending: Emergency Medicine | Admitting: Emergency Medicine

## 2018-03-30 ENCOUNTER — Other Ambulatory Visit: Payer: Self-pay

## 2018-03-30 ENCOUNTER — Encounter: Payer: Self-pay | Admitting: Emergency Medicine

## 2018-03-30 DIAGNOSIS — F10120 Alcohol abuse with intoxication, uncomplicated: Secondary | ICD-10-CM | POA: Insufficient documentation

## 2018-03-30 DIAGNOSIS — F1010 Alcohol abuse, uncomplicated: Secondary | ICD-10-CM | POA: Insufficient documentation

## 2018-03-30 DIAGNOSIS — Z87891 Personal history of nicotine dependence: Secondary | ICD-10-CM | POA: Insufficient documentation

## 2018-03-30 DIAGNOSIS — F10920 Alcohol use, unspecified with intoxication, uncomplicated: Secondary | ICD-10-CM

## 2018-03-30 NOTE — ED Provider Notes (Signed)
[facility] Emergency Department Provider Note  ____________________________________________   First MD Initiated Contact with Patient 03/30/18 2109     (approximate)  I have reviewed the triage vital signs and the nursing notes.   HISTORY  Chief Complaint Medical Clearance  Level 5 caveat:  history/ROS limited by acute intoxication  HPI Kristopher Duran is a 52 y.o. male with history of alcohol dependence who presents for medical clearance for jail.  He reportedly was brought in for driving under the influence and when he got to the jail his breathalyzer was high enough that apparently he needs physician clearance to go back.  The patient has slurred speech and admits to alcohol intoxication but he is alert and oriented and ambulatory with a steady gait.  He denies any distress and denies chest pain, shortness of breath, vomiting, abdominal pain.  He states he is a little bit nauseated.  His intoxication is severe and gradual in onset and he admits to having a problem with alcohol abuse.  Nothing particular makes his symptoms better or worse.  Past Medical History:  Diagnosis Date  . Alcohol dependence in remission [facility](HCC)    [facility] and [facility] for 28 days   . Inguinal hernia    Right    Patient Active Problem List   Diagnosis Date Noted  . Pedal edema 02/14/2016  . Hernia of abdominal cavity 11/09/2015  . Alcohol dependence in remission [facility](HCC)     Past Surgical History:  Procedure Laterality Date  . MANDIBLE FRACTURE SURGERY      Prior to Admission medications   Not on File    Allergies Patient has no known allergies.  Family History  Problem Relation Age of Onset  . Diabetes type II Father   . Diabetes Father   . Arthritis Mother     Social History Social History   Tobacco Use  . Smoking status: Former Smoker    Last attempt to quit: 11/04/2011    Years since quitting: 6.4  . Smokeless tobacco: Never Used  Substance Use Topics    . Alcohol use: Yes    Comment: used to drink five 40 ounce bottles of malt liquor a day; sober May 2017  . Drug use: No    Types: Marijuana, Cocaine, "Crack" cocaine    Comment: none since 2011    Review of Systems Level 5 caveat:  history/ROS limited by acute intoxication  ____________________________________________   PHYSICAL EXAM:  VITAL SIGNS: ED Triage Vitals [03/30/18 2018]  Enc Vitals Group     BP 132/86     Pulse Rate 86     Resp 14     Temp 97.7 F (36.5 C)     Temp Source Oral     SpO2 97 %     Weight 70.8 kg (156 lb)     Height 1.778 m (5\' 10" )     Head Circumference      Peak Flow      Pain Score 0     Pain Loc      Pain Edu?      Excl. in GC?     Constitutional: Alert and oriented.  Somnolent but easily arousable.  Does appear intoxicated but is speaking clearly, answering questions appropriately, and ambulatory with a steady gait Eyes: Conjunctivae are normal.  Head: Atraumatic. Nose: No congestion/rhinnorhea. Mouth/Throat: Mucous membranes are moist. Neck: No stridor.  No meningeal signs.   Cardiovascular: Normal rate, regular rhythm. Good peripheral circulation.  Grossly normal heart sounds. Respiratory: Normal respiratory effort.  No retractions. Lungs CTAB. Gastrointestinal: Soft and nontender. No distention.  Musculoskeletal: No lower extremity tenderness nor edema. No gross deformities of extremities. Neurologic: Mildly slurred speech and language. No gross focal neurologic deficits are appreciated.  Skin:  Skin is warm, dry and intact. No rash noted. Psychiatric: Mood and affect are normal. Speech and behavior are normal.  Denies SI HI  ____________________________________________   LABS (all labs ordered are listed, but only abnormal results are displayed)  Labs Reviewed - No data to display ____________________________________________  EKG  No indication for EKG ____________________________________________  RADIOLOGY   ED MD  interpretation: No indication for imaging  Official radiology report(s): No results found.  ____________________________________________   PROCEDURES  Critical Care performed: No   Procedure(s) performed:   Procedures   ____________________________________________   INITIAL IMPRESSION / ASSESSMENT AND PLAN / ED COURSE  As part of my medical decision making, I reviewed the following data within the electronic MEDICAL RECORD NUMBER Nursing notes reviewed and incorporated and Notes from prior ED visits    The patient is intoxicated and denies any other substance abuse.  He is ambulatory, alert, oriented, and somnolent from the intoxication but is not presenting any concern for his medical safety.  He has a GCS of 15 and is protecting his airway.  He is medically cleared for jail.  No additional work-up is necessary.  I spoke with officer who is currently with him and explained the situation.  She is comfortable taking him to jail.     ____________________________________________  FINAL CLINICAL IMPRESSION(S) / ED DIAGNOSES  Final diagnoses:  Alcoholic intoxication without complication (HCC)  Alcohol abuse     MEDICATIONS GIVEN DURING THIS VISIT:  Medications - No data to display   ED Discharge Orders    None       Note:  This document was prepared using Dragon voice recognition software and may include unintentional dictation errors.    Loleta Rose, MD 03/30/18 2120

## 2018-03-30 NOTE — Discharge Instructions (Signed)
MEDICALLY CLEARED FOR JAIL  You were seen in the emergency department for alcohol intoxication.  Please seek help from the recommended resources for assistance with your alcohol dependence.  If you have any thoughts of hurting herself or others, please call 911 or return to the emergency department.  Please avoid drug and alcohol use.  Never drive a vehicle or operate machinery while intoxicated.

## 2018-03-30 NOTE — ED Triage Notes (Signed)
Pt arrives courtesy of [employer] for ETOH. Per officer, pt blew > at the jail and needs a physician clearance to go back. Pt currently able to walk with steady gait and no distress. Pt is in NAD.

## 2018-03-30 NOTE — ED Notes (Signed)
[id number]

## 2018-10-03 ENCOUNTER — Other Ambulatory Visit: Payer: Self-pay

## 2018-10-03 ENCOUNTER — Emergency Department
Admission: EM | Admit: 2018-10-03 | Discharge: 2018-10-04 | Disposition: A | Discharge status: Home / Self Care | Payer: Self-pay | Attending: Emergency Medicine | Admitting: Emergency Medicine

## 2018-10-03 DIAGNOSIS — T68XXXA Hypothermia, initial encounter: Secondary | ICD-10-CM | POA: Insufficient documentation

## 2018-10-03 DIAGNOSIS — X31XXXA Exposure to excessive natural cold, initial encounter: Secondary | ICD-10-CM | POA: Insufficient documentation

## 2018-10-03 NOTE — ED Provider Notes (Signed)
[facility] Emergency Department Provider Note  ____________________________________________  Time seen: Approximately 11:35 PM  I have reviewed the triage vital signs and the nursing notes.   HISTORY  Chief Complaint Cold Exposure    HPI Kristopher Duran is a 53 y.o. male with no significant past medical history who is homeless was found on the side of the road by police, concerned that he may be hypothermic due to cold temperatures outside of about 40 degrees.  Patient denies any complaints.      Past medical history noncontributory   There are no active problems to display for this patient.    Past surgical history noncontributory   Prior to Admission medications   Not on File  None   Allergies Patient has no known allergies.   No family history on file.  Social History Social History   Tobacco Use  . Smoking status: Not on file  Substance Use Topics  . Alcohol use: Not on file  . Drug use: Not on file  Positive daily alcohol use.  Review of Systems  Constitutional:   No fever or chills.  ENT:   No sore throat. No rhinorrhea. Cardiovascular:   No chest pain or syncope. Respiratory:   No dyspnea or cough. Gastrointestinal:   Negative for abdominal pain, vomiting and diarrhea.  Musculoskeletal:   Negative for focal pain or swelling All other systems reviewed and are negative except as documented above in ROS and HPI.  ____________________________________________   PHYSICAL EXAM:  VITAL SIGNS: ED Triage Vitals  Enc Vitals Group     BP 10/03/18 2043 135/82     Pulse Rate 10/03/18 2043 89     Resp 10/03/18 2043 (!) 22     Temp --      Temp src --      SpO2 10/03/18 2043 100 %     Weight 10/03/18 2042 127 lb (57.6 kg)     Height 10/03/18 2042 5\' 9"  (1.753 m)     Head Circumference --      Peak Flow --      Pain Score 10/03/18 2042 0     Pain Loc --      Pain Edu? --      Excl. in GC? --     Vital signs reviewed,  nursing assessments reviewed.   Constitutional:   Alert and oriented. Non-toxic appearance. Eyes:   Conjunctivae are normal. EOMI. PERRL. ENT      Head:   Normocephalic and atraumatic.      Nose:   No congestion/rhinnorhea.       Mouth/Throat:   MMM, no pharyngeal erythema. No peritonsillar mass.       Neck:   No meningismus. Full ROM. Hematological/Lymphatic/Immunilogical:   No cervical lymphadenopathy. Cardiovascular:   RRR. Symmetric bilateral radial and DP pulses.  No murmurs. Cap refill less than 2 seconds. Respiratory:   Normal respiratory effort without tachypnea/retractions. Breath sounds are clear and equal bilaterally. No wheezes/rales/rhonchi. Gastrointestinal:   Soft and nontender. Non distended. There is no CVA tenderness.  No rebound, rigidity, or guarding.  Musculoskeletal:   Normal range of motion in all extremities. No joint effusions.  No lower extremity tenderness.  No edema. Neurologic:   Normal speech and language.  Motor grossly intact. No acute focal neurologic deficits are appreciated.  Skin:    Skin is warm, dry and intact. No rash noted.  No petechiae, purpura, or bullae.  ____________________________________________    LABS (pertinent positives/negatives) (all labs  ordered are listed, but only abnormal results are displayed) Labs Reviewed - No data to display ____________________________________________   EKG    ____________________________________________    RADIOLOGY  No results found.  ____________________________________________   PROCEDURES Procedures  ____________________________________________    CLINICAL IMPRESSION / ASSESSMENT AND PLAN / ED COURSE  Pertinent labs & imaging results that were available during my care of the patient were reviewed by me and considered in my medical decision making (see chart for details).    Patient presents after cold exposure.  Rectal temp was 95.5.  After period of warming and dry close,  repeat temp at 11:30 PM is 98.6.  He is asymptomatic and vital signs are stable, tolerating oral intake, neurologically intact and ambulatory.  Stable for discharge home.      ____________________________________________   FINAL CLINICAL IMPRESSION(S) / ED DIAGNOSES    Final diagnoses:  Hypothermia due to cold environment     ED Discharge Orders    None      Portions of this note were generated with dragon dictation software. Dictation errors may occur despite best attempts at proofreading.   Sharman Cheek, MD 10/03/18 [id number]

## 2018-10-03 NOTE — ED Triage Notes (Signed)
Patient is homeless.  Found on side of road by [place] pd and they were concerned he may be hypothermic.

## 2018-10-03 NOTE — ED Notes (Signed)
Patient found by [employer] laying in roadway off of [address]. Patient is homeless and is an alcoholic. When [employer] arrived to scene, patient was unable to talk due to shaking so badly. [employer] believes patient to be hypothermic as he has been outside all day and most of the evening with light jacket on.

## 2018-10-04 ENCOUNTER — Other Ambulatory Visit: Payer: Self-pay

## 2018-10-04 ENCOUNTER — Encounter: Payer: Self-pay | Admitting: Emergency Medicine

## 2018-10-04 ENCOUNTER — Inpatient Hospital Stay
Admission: EM | Admit: 2018-10-04 | Discharge: 2018-10-09 | DRG: 896 | Disposition: A | Discharge status: Home / Self Care | Payer: Self-pay | Attending: Internal Medicine | Admitting: Internal Medicine

## 2018-10-04 DIAGNOSIS — D6959 Other secondary thrombocytopenia: Secondary | ICD-10-CM | POA: Diagnosis present

## 2018-10-04 DIAGNOSIS — F10930 Alcohol use, unspecified with withdrawal, uncomplicated: Secondary | ICD-10-CM

## 2018-10-04 DIAGNOSIS — T68XXXA Hypothermia, initial encounter: Secondary | ICD-10-CM | POA: Diagnosis present

## 2018-10-04 DIAGNOSIS — Z681 Body mass index (BMI) 19 or less, adult: Secondary | ICD-10-CM

## 2018-10-04 DIAGNOSIS — F10931 Alcohol use, unspecified with withdrawal delirium: Secondary | ICD-10-CM | POA: Diagnosis present

## 2018-10-04 DIAGNOSIS — Z87891 Personal history of nicotine dependence: Secondary | ICD-10-CM

## 2018-10-04 DIAGNOSIS — F10230 Alcohol dependence with withdrawal, uncomplicated: Secondary | ICD-10-CM

## 2018-10-04 DIAGNOSIS — F1010 Alcohol abuse, uncomplicated: Secondary | ICD-10-CM

## 2018-10-04 DIAGNOSIS — I951 Orthostatic hypotension: Secondary | ICD-10-CM | POA: Diagnosis not present

## 2018-10-04 DIAGNOSIS — I471 Supraventricular tachycardia: Secondary | ICD-10-CM | POA: Diagnosis present

## 2018-10-04 DIAGNOSIS — Z590 Homelessness: Secondary | ICD-10-CM

## 2018-10-04 DIAGNOSIS — H109 Unspecified conjunctivitis: Secondary | ICD-10-CM | POA: Diagnosis present

## 2018-10-04 DIAGNOSIS — X58XXXA Exposure to other specified factors, initial encounter: Secondary | ICD-10-CM | POA: Diagnosis present

## 2018-10-04 DIAGNOSIS — F10231 Alcohol dependence with withdrawal delirium: Principal | ICD-10-CM | POA: Diagnosis present

## 2018-10-04 DIAGNOSIS — E876 Hypokalemia: Secondary | ICD-10-CM | POA: Diagnosis present

## 2018-10-04 DIAGNOSIS — E43 Unspecified severe protein-calorie malnutrition: Secondary | ICD-10-CM

## 2018-10-04 DIAGNOSIS — K219 Gastro-esophageal reflux disease without esophagitis: Secondary | ICD-10-CM | POA: Diagnosis present

## 2018-10-04 HISTORY — DX: Alcohol abuse, uncomplicated: F10.10

## 2018-10-04 LAB — COMPREHENSIVE METABOLIC PANEL
ALK PHOS: 73 U/L (ref 38–126)
ALT: 28 U/L (ref 0–44)
AST: 110 U/L — ABNORMAL HIGH (ref 15–41)
Albumin: 4.4 g/dL (ref 3.5–5.0)
Anion gap: 11 (ref 5–15)
BILIRUBIN TOTAL: 1.1 mg/dL (ref 0.3–1.2)
BUN: 7 mg/dL (ref 6–20)
CO2: 27 mmol/L (ref 22–32)
Calcium: 8.9 mg/dL (ref 8.9–10.3)
Chloride: 99 mmol/L (ref 98–111)
Creatinine, Ser: 0.65 mg/dL (ref 0.61–1.24)
GFR calc Af Amer: >60 mL/min (ref >60)
GFR calc non Af Amer: >60 mL/min (ref >60)
Glucose, Bld: 103 mg/dL — ABNORMAL HIGH (ref 70–99)
Potassium: 3.9 mmol/L (ref 3.5–5.1)
Sodium: 137 mmol/L (ref 135–145)
Total Protein: 8.1 g/dL (ref 6.5–8.1)

## 2018-10-04 LAB — CBC
HCT: 36.6 % — ABNORMAL LOW (ref 39.0–52.0)
Hemoglobin: 12.8 g/dL — ABNORMAL LOW (ref 13.0–17.0)
MCH: 30.8 pg (ref 26.0–34.0)
MCHC: 35 g/dL (ref 30.0–36.0)
MCV: 88 fL (ref 80.0–100.0)
PLATELETS: 121 10*3/uL — AB (ref 150–400)
RBC: 4.16 MIL/uL — ABNORMAL LOW (ref 4.22–5.81)
RDW: 15.5 % (ref 11.5–15.5)
WBC: 6 10*3/uL (ref 4.0–10.5)
nRBC: 0 % (ref 0.0–0.2)

## 2018-10-04 LAB — ETHANOL: Alcohol, Ethyl (B): 114 mg/dL — ABNORMAL HIGH (ref <10)

## 2018-10-04 MED ORDER — LORAZEPAM 2 MG/ML IJ SOLN
2.0000 mg | Freq: Once | INTRAMUSCULAR | Status: AC
  Administered 2018-10-04: 2 mg via INTRAVENOUS
  Filled 2018-10-04: qty 1

## 2018-10-04 MED ORDER — VITAMIN B-1 100 MG PO TABS
100.0000 mg | ORAL_TABLET | Freq: Every day | ORAL | Status: DC
  Administered 2018-10-05 – 2018-10-06 (×2): 100 mg via ORAL
  Filled 2018-10-04 (×2): qty 1

## 2018-10-04 MED ORDER — LORAZEPAM 2 MG/ML IJ SOLN
INTRAMUSCULAR | Status: AC
  Filled 2018-10-04: qty 1

## 2018-10-04 MED ORDER — LORAZEPAM 2 MG PO TABS: 0.0000 mg | ORAL_TABLET | Freq: Four times a day (QID) | ORAL | Status: DC

## 2018-10-04 MED ORDER — THIAMINE HCL 100 MG/ML IJ SOLN
INTRAVENOUS | Status: DC
  Administered 2018-10-04 (×2): via INTRAVENOUS
  Filled 2018-10-04 (×2): qty 1000

## 2018-10-04 MED ORDER — LORAZEPAM 2 MG PO TABS: 0.0000 mg | ORAL_TABLET | Freq: Two times a day (BID) | ORAL | Status: AC

## 2018-10-04 MED ORDER — ONDANSETRON HCL 4 MG PO TABS: 4.0000 mg | ORAL_TABLET | Freq: Four times a day (QID) | ORAL | Status: DC | PRN

## 2018-10-04 MED ORDER — THIAMINE HCL 100 MG/ML IJ SOLN
100.0000 mg | Freq: Every day | INTRAMUSCULAR | Status: DC
  Administered 2018-10-04: 100 mg via INTRAVENOUS
  Filled 2018-10-04 (×2): qty 2

## 2018-10-04 MED ORDER — LORAZEPAM 2 MG/ML IJ SOLN
0.0000 mg | INTRAMUSCULAR | Status: DC
  Administered 2018-10-04 – 2018-10-06 (×10): 2 mg via INTRAVENOUS
  Administered 2018-10-07: 1 mg via INTRAVENOUS
  Filled 2018-10-04 (×4): qty 1
  Filled 2018-10-04: qty 2
  Filled 2018-10-04 (×6): qty 1

## 2018-10-04 MED ORDER — LORAZEPAM 2 MG PO TABS
0.0000 mg | ORAL_TABLET | ORAL | Status: DC
  Administered 2018-10-04 (×2): 2 mg via ORAL
  Filled 2018-10-04 (×2): qty 1

## 2018-10-04 MED ORDER — FAMOTIDINE 20 MG PO TABS
20.0000 mg | ORAL_TABLET | Freq: Two times a day (BID) | ORAL | Status: DC
  Administered 2018-10-04 – 2018-10-06 (×4): 20 mg via ORAL
  Filled 2018-10-04 (×4): qty 1

## 2018-10-04 MED ORDER — DOCUSATE SODIUM 100 MG PO CAPS: 100.0000 mg | ORAL_CAPSULE | Freq: Two times a day (BID) | ORAL | Status: DC | PRN

## 2018-10-04 MED ORDER — LORAZEPAM 2 MG/ML IJ SOLN: 0.0000 mg | Freq: Four times a day (QID) | INTRAMUSCULAR | Status: DC

## 2018-10-04 MED ORDER — ONDANSETRON HCL 4 MG/2ML IJ SOLN: 4.0000 mg | Freq: Four times a day (QID) | INTRAMUSCULAR | Status: DC | PRN

## 2018-10-04 MED ORDER — ENOXAPARIN SODIUM 40 MG/0.4ML ~~LOC~~ SOLN
40.0000 mg | SUBCUTANEOUS | Status: DC
  Administered 2018-10-04 – 2018-10-06 (×3): 40 mg via SUBCUTANEOUS
  Filled 2018-10-04 (×3): qty 0.4

## 2018-10-04 MED ORDER — LORAZEPAM 2 MG/ML IJ SOLN: 0.0000 mg | Freq: Two times a day (BID) | INTRAMUSCULAR | Status: AC

## 2018-10-04 NOTE — ED Notes (Signed)
E-signature not working at this time.  

## 2018-10-04 NOTE — Progress Notes (Signed)
Ch called nurse for a code for pt in room 213. Ch. Visited pt who was still shaking and under distress. Pt shared that he had treatment before related to his alcohol withdrawal and it work for a while. Pt still works at a part-time job but drinks to fight off the shaking. f/u recommended.     10/04/18 1600  Clinical Encounter Type  Visited With Patient  Visit Type Code;Social support;Spiritual support  Referral From Nurse  Consult/Referral To Chaplain  Recommendations  (treatment options with d/c orders )  Spiritual Encounters  Spiritual Needs Emotional  Stress Factors  Patient Stress Factors Health changes;Loss of control;Major life changes  Family Stress Factors None identified

## 2018-10-04 NOTE — Significant Event (Signed)
Rapid Response Event Note  Overview: Time Called: 1606 Arrival Time: 1607 Event Type: Other (Comment)(etoh withdrawal)  Initial Focused Assessment: Patient found tremoring and soiled self.  He stated his last alcoholic drink was last night.  Vitals are stable.  Patient admitted for alcohol withdrawal per bedside RN. Interventions: Patient was given ativan 2mg  IV prior to my arrival at beside.   Plan of Care (if not transferred): To give ativan q4hr while patient is going through alcohol withdrawls. Event Summary:   at      at          [facility]

## 2018-10-04 NOTE — ED Notes (Signed)
Pt sleeping no complaints at this time

## 2018-10-04 NOTE — Progress Notes (Signed)
Family Meeting Note  Advance Directive:yes  Today a meeting took place with the Patient.    The following clinical team members were present during this meeting:MD  The following were discussed:Patient's diagnosis: Alcohol withdrawal with DTs, history of alcohol abuse, GERD, hypotention, homeless, treatment plan of care discussed in detail with the patient.  He is aware of the plan   patient's progosis: Unable to determine and Goals for treatment: Full Code  Patient's mom is the healthcare power of attorney  Additional follow-up to be provided: Hospitalist  Time spent during discussion:16 min  Ramonita Lab, MD

## 2018-10-04 NOTE — ED Notes (Signed)
Patient transported to room 213 

## 2018-10-04 NOTE — H&P (Addendum)
[facility] [place] [facility]   PATIENT NAME: Kristopher Duran    MR#:  [id number]  DATE OF BIRTH:  10-06-1965  DATE OF ADMISSION:  10/04/2018  PRIMARY CARE PHYSICIAN: Patient, No Pcp Per   REQUESTING/REFERRING PHYSICIAN: schavitz  CHIEF COMPLAINT:  shakes and hypotension  HISTORY OF PRESENT ILLNESS:  Kristopher Duran  is a 53 y.o. male with a known history of alcohol abuse GERD came to the emergency department for being off balance and shakes.  Patient is homeless and just seen the emergency department for hypothermia in the past 24 hours he was rewarmed and discharged.  Patient was sitting in the emergency department waiting room and was very shaky.  He says he lives in his car.  As patient was very shaky and unsteady he was brought in back to the emergency department.  Patient admits to drinking beer all day yesterday.  No family members at bedside and he does not live with any family members.  PAST MEDICAL HISTORY:   Past Medical History:  Diagnosis Date  . ETOH abuse     PAST SURGICAL HISTOIRY:  History reviewed. No pertinent surgical history.  SOCIAL HISTORY:   Social History   Tobacco Use  . Smoking status: Never Smoker  . Smokeless tobacco: Never Used  Substance Use Topics  . Alcohol use: Yes    FAMILY HISTORY:  Family history reviewed patient denies any family history  DRUG ALLERGIES:  No Known Allergies  REVIEW OF SYSTEMS:  CONSTITUTIONAL: No fever, fatigue or weakness.  EYES: No blurred or double vision.  EARS, NOSE, AND THROAT: No tinnitus or ear pain.  RESPIRATORY: No cough, shortness of breath, wheezing or hemoptysis.  CARDIOVASCULAR: No chest pain, orthopnea, edema.  GASTROINTESTINAL: No nausea, vomiting, diarrhea or abdominal pain.  GENITOURINARY: No dysuria, hematuria.  ENDOCRINE: No polyuria, nocturia,  HEMATOLOGY: No anemia, easy bruising or bleeding SKIN: No rash or lesion. MUSCULOSKELETAL: No joint pain or  arthritis.   NEUROLOGIC: Unsteady and shaky no tingling, numbness PSYCHIATRY: No anxiety or depression.   MEDICATIONS AT HOME:   Prior to Admission medications   Not on File      VITAL SIGNS:  Blood pressure 98/71, pulse 83, temperature 98.3 F (36.8 C), resp. rate 12, height 5\' 9"  (1.753 m), weight 57 kg, SpO2 100 %.  PHYSICAL EXAMINATION:  GENERAL:  53 y.o.-year-old patient lying in the bed with no acute distress.  EYES: Pupils equal, round, reactive to light and accommodation. No scleral icterus. Extraocular muscles intact.  HEENT: Head atraumatic, normocephalic. Oropharynx and nasopharynx clear.  NECK:  Supple, no jugular venous distention. No thyroid enlargement, no tenderness.  LUNGS: Normal breath sounds bilaterally, no wheezing, rales,rhonchi or crepitation. No use of accessory muscles of respiration.  CARDIOVASCULAR: S1, S2 normal. No murmurs, rubs, or gallops.  ABDOMEN: Soft, nontender, nondistended. Bowel sounds present.  EXTREMITIES: No pedal edema, cyanosis, or clubbing.  NEUROLOGIC: Awake, alert ,oriented times3 . Sensation intact. Gait not checked.  PSYCHIATRIC: The patient is alert and oriented x 3.  SKIN: No obvious rash, lesion, or ulcer.   LABORATORY PANEL:   CBC Recent Labs  Lab 10/04/18 0812  WBC 6.0  HGB 12.8*  HCT 36.6*  PLT 121*   ------------------------------------------------------------------------------------------------------------------  Chemistries  Recent Labs  Lab 10/04/18 0812  NA 137  K 3.9  CL 99  CO2 27  GLUCOSE 103*  BUN 7  CREATININE 0.65  CALCIUM 8.9  AST 110*  ALT 28  ALKPHOS 73  BILITOT 1.1   ------------------------------------------------------------------------------------------------------------------  Cardiac Enzymes No results for input(s): TROPONINI in the last 168 hours. ------------------------------------------------------------------------------------------------------------------  RADIOLOGY:  No  results found.  EKG:  No orders found for this or any previous visit.  IMPRESSION AND PLAN:     #  Alcohol withdrawal -DTs Admit to MedSurg unit CIWA protocol Multivitamin thiamine and folate Patient will be benefited with outpatient alcohol Anonymous program Ativan as needed  #Alcohol abuse  Counseled patient to quit drinking Recommended to follow outpatient AA program  # hypotension  Hydrate with IV fluids-banana bag  #GERD -Pepcid  DVT prophylaxis with Lovenox  All the records are reviewed and case discussed with ED provider. Management plans discussed with the patient, family and they are in agreement.  CODE STATUS: FC   TOTAL TIME TAKING CARE OF THIS PATIENT: 41  minutes.   Note: This dictation was prepared with Dragon dictation along with smaller phrase technology. Any transcriptional errors that result from this process are unintentional.  Ramonita Lab M.D on 10/04/2018 at 10:51 AM  Between 7am to 6pm - Pager - [phone]  After 6pm go to [email or web address] - password [email or web address]  [facility]  Office  [phone]  CC: Primary care physician; Patient, No Pcp Per

## 2018-10-04 NOTE — ED Triage Notes (Signed)
Pt seen in ED last night and was D/C, pt found in the lobby this morning. Pt has notable tremors. Pt reports that he drinks everyday, all day. Last drink was yesterday prior to coming into the hospital. Pt denies hx/o seizures from alcohol withdrawal.

## 2018-10-04 NOTE — ED Provider Notes (Addendum)
[facility] Emergency Department Provider Note  ____________________________________________   First MD Initiated Contact with Patient 10/04/18 [phone]     (approximate)  I have reviewed the triage vital signs and the nursing notes.   HISTORY  Chief Complaint Delirium Tremens (DTS)   HPI Kristopher Duran is a 53 y.o. male with a history of homelessness as well as alcohol abuse was presented emergency department today with alcohol withdrawal.  He was seen within the past 24 hours in his emergency department for hypothermia.  He was rewarmed and then discharged.  He was then sitting in the waiting room and started to become shaky.  He says that he does not have a place to live and is currently living in his car.  Nursing noticed that he was shaking and then brought him back to the emergency department for further evaluation.  Patient denies any pain.  States that he drinks "all day every day."  Says that he drinks beer.  Denies any drug use.  Denies ever having a withdrawal seizure.   Past Medical History:  Diagnosis Date  . ETOH abuse     There are no active problems to display for this patient.   History reviewed. No pertinent surgical history.  Prior to Admission medications   Not on File    Allergies Patient has no known allergies.  No family history on file.  Social History Social History   Tobacco Use  . Smoking status: Never Smoker  . Smokeless tobacco: Never Used  Substance Use Topics  . Alcohol use: Yes  . Drug use: Not Currently    Review of Systems  Constitutional: No fever/chills Eyes: No visual changes. ENT: No sore throat. Cardiovascular: Denies chest pain. Respiratory: Denies shortness of breath. Gastrointestinal: No abdominal pain.  No nausea, no vomiting.  No diarrhea.  No constipation. Genitourinary: Negative for dysuria. Musculoskeletal: Negative for back pain. Skin: Negative for rash. Neurological: Negative for  headaches, focal weakness or numbness.   ____________________________________________   PHYSICAL EXAM:  VITAL SIGNS: ED Triage Vitals [10/04/18 0805]  Enc Vitals Group     BP (!) 140/91     Pulse Rate 86     Resp 16     Temp 98.3 F (36.8 C)     Temp src      SpO2 98 %     Weight 125 lb 10.6 oz (57 kg)     Height 5\' 9"  (1.753 m)     Head Circumference      Peak Flow      Pain Score 0     Pain Loc      Pain Edu?      Excl. in GC?     Constitutional: Alert and oriented.  Patient with bilateral tremors of the hands and feet.  Tremors are at rest.  Voice is shaky due to the tremors as well.  However, does not appear to be hallucinating.  Is alert and oriented x3 and answering questions appropriately. Eyes: Conjunctivae are normal.  Head: Atraumatic. Nose: No congestion/rhinnorhea. Mouth/Throat: Mucous membranes are moist.  Neck: No stridor.   Cardiovascular: Normal rate, regular rhythm. Grossly normal heart sounds.   Respiratory: Normal respiratory effort.  No retractions. Lungs CTAB. Gastrointestinal: Soft and nontender. No distention. Musculoskeletal: No lower extremity tenderness nor edema.  No joint effusions. Neurologic:  Normal speech and language. No gross focal neurologic deficits are appreciated. Skin:  Skin is warm, dry and intact. No rash noted. Psychiatric: Mood and  affect are normal. Speech and behavior are normal.  ____________________________________________   LABS (all labs ordered are listed, but only abnormal results are displayed)  Labs Reviewed  COMPREHENSIVE METABOLIC PANEL  ETHANOL  CBC  URINE DRUG SCREEN, QUALITATIVE ([facility] ONLY)   ____________________________________________  EKG   ____________________________________________  RADIOLOGY   ____________________________________________   PROCEDURES  Procedure(s) performed:   Procedures  Critical Care performed:   ____________________________________________   INITIAL  IMPRESSION / ASSESSMENT AND PLAN / ED COURSE  Pertinent labs & imaging results that were available during my care of the patient were reviewed by me and considered in my medical decision making (see chart for details).  DDX: Alcohol withdrawal, electrolyte abnormality, hypoglycemia, renal failure, delirium tremens As part of my medical decision making, I reviewed the following data within the electronic MEDICAL RECORD NUMBER Notes from prior ED visits  Patient not interested in going to RTS at this time.  Placed on CIWA protocol.  Will treat his tremors and reevaluate.  ----------------------------------------- 10:36 AM on 10/04/2018 -----------------------------------------  Patient given 2 mg of Ativan.  When I went into the room he was asleep with reassuring vital signs.  However, when woken up he is quite tremulous.  Unable to stand and walk without assistance.  Able to guide him back to the bed.  Decision made to admit the patient to the hospital.  Signed out to Dr. Amado CoeGouru.  Patient aware of need for admission to the hospital and cooperative with plan. ____________________________________________   FINAL CLINICAL IMPRESSION(S) / ED DIAGNOSES  Alcohol withdrawal  NEW MEDICATIONS STARTED DURING THIS VISIT:  New Prescriptions   No medications on file     Note:  This document was prepared using Dragon voice recognition software and may include unintentional dictation errors.     Myrna BlazerSchaevitz, Esha Fincher Matthew, MD 10/04/18 1037    Yarelis Ambrosino, Myra Rudeavid Matthew, MD 10/04/18 1037

## 2018-10-04 NOTE — ED Notes (Signed)
ED TO INPATIENT HANDOFF REPORT  Name/Age/Gender Kristopher Duran 53 y.o. male  Code Status    Code Status Orders  (From admission, onward)         Start     Ordered   10/04/18 0807  Full code  Continuous     10/04/18 0806        Code Status History    This patient has a current code status but no historical code status.      Home/SNF/Other Home  Chief Complaint DT  Level of Care/Admitting Diagnosis ED Disposition    ED Disposition Condition Comment   Admit  Hospital Area: [facility] [[id number]]  Level of Care: Med-Surg [16]  Diagnosis: Alcohol withdrawal delirium (HCC) [291.0.ICD-9-CM]  Admitting Physician: Ramonita Lab [[id number]]  Attending Physician: Ramonita Lab [[id number]]  Estimated length of stay: past midnight tomorrow  Certification:: I certify this patient will need inpatient services for at least 2 midnights  PT Class (Do Not Modify): Inpatient [101]  PT Acc Code (Do Not Modify): Private [1]       Medical History Past Medical History:  Diagnosis Date  . ETOH abuse     Allergies No Known Allergies  IV Location/Drains/Wounds Patient Lines/Drains/Airways Status   Active Line/Drains/Airways    None          Labs/Imaging Results for orders placed or performed during the hospital encounter of 10/04/18 (from the past 48 hour(s))  Comprehensive metabolic panel     Status: Abnormal   Collection Time: 10/04/18  8:12 AM  Result Value Ref Range   Sodium 137 135 - 145 mmol/L   Potassium 3.9 3.5 - 5.1 mmol/L   Chloride 99 98 - 111 mmol/L   CO2 27 22 - 32 mmol/L   Glucose, Bld 103 (H) 70 - 99 mg/dL   BUN 7 6 - 20 mg/dL   Creatinine, Ser 6.72 0.61 - 1.24 mg/dL   Calcium 8.9 8.9 - 09.4 mg/dL   Total Protein 8.1 6.5 - 8.1 g/dL   Albumin 4.4 3.5 - 5.0 g/dL   AST 709 (H) 15 - 41 U/L   ALT 28 0 - 44 U/L   Alkaline Phosphatase 73 38 - 126 U/L   Total Bilirubin 1.1 0.3 - 1.2 mg/dL   GFR calc non Af Amer >60 >60 mL/min   GFR calc Af  Amer >60 >60 mL/min   Anion gap 11 5 - 15    Comment: Performed at [facility], [address] [place], [place] [address]  Ethanol     Status: Abnormal   Collection Time: 10/04/18  8:12 AM  Result Value Ref Range   Alcohol, Ethyl (B) 114 (H) <10 mg/dL    Comment: CORRECTED RESULTS C/BRANDY DAVIS AT 1116 10/04/2018 DAS (NOTE) Lowest detectable limit for serum alcohol is 10 mg/dL. For medical purposes only. Performed at [facility], [address] [place], [place] [address] CORRECTED ON 01/18 AT 1115: PREVIOUSLY REPORTED AS 129   cbc     Status: Abnormal   Collection Time: 10/04/18  8:12 AM  Result Value Ref Range   WBC 6.0 4.0 - 10.5 K/uL   RBC 4.16 (L) 4.22 - 5.81 MIL/uL   Hemoglobin 12.8 (L) 13.0 - 17.0 g/dL   HCT 65.4 (L) 65.0 - 35.4 %   MCV 88.0 80.0 - 100.0 fL   MCH 30.8 26.0 - 34.0 pg   MCHC 35.0 30.0 - 36.0 g/dL   RDW 65.6 81.2 - 75.1 %  Platelets 121 (L) 150 - 400 K/uL   nRBC 0.0 0.0 - 0.2 %    Comment: Performed at [facility], [address] [place], [place] [address]   No results found.  Pending Labs [employer] (From admission, onward)    Start     Ordered   10/04/18 0807  Urine Drug Screen, Qualitative  Once,   STAT     10/04/18 0806   Signed and Held  HIV antibody (Routine Testing)  Once,   R     Signed and Held   Signed and Held  CBC  (enoxaparin (LOVENOX)    CrCl >/= 30 ml/min)  Once,   R    Comments:  Baseline for enoxaparin therapy IF NOT ALREADY DRAWN.  Notify MD if PLT < 100 K.    Signed and Held   Signed and Held  Creatinine, serum  (enoxaparin (LOVENOX)    CrCl >/= 30 ml/min)  Once,   R    Comments:  Baseline for enoxaparin therapy IF NOT ALREADY DRAWN.    Signed and Held   Signed and Held  Creatinine, serum  (enoxaparin (LOVENOX)    CrCl >/= 30 ml/min)  Weekly,   R    Comments:  while on enoxaparin therapy    Signed and Held   Signed and Held  Comprehensive metabolic panel  Tomorrow morning,   R      Signed and Held   Signed and Held  CBC  Tomorrow morning,   R     Signed and Held          Vitals/Pain Today's Vitals   10/04/18 1100 10/04/18 1115 10/04/18 1130 10/04/18 1138  BP: 116/74  115/76 115/76  Pulse: 75  70 (!) 105  Resp: 12 13 13    Temp:      SpO2: 100%  98%   Weight:      Height:      PainSc:        Isolation Precautions No active isolations  Medications Medications  LORazepam (ATIVAN) injection 0-4 mg (0 mg Intravenous Not Given 10/04/18 0831)    Or  LORazepam (ATIVAN) tablet 0-4 mg ( Oral See Alternative 10/04/18 0831)  LORazepam (ATIVAN) injection 0-4 mg (has no administration in time range)    Or  LORazepam (ATIVAN) tablet 0-4 mg (has no administration in time range)  thiamine (VITAMIN B-1) tablet 100 mg ( Oral See Alternative 10/04/18 1423)    Or  thiamine (B-1) injection 100 mg (100 mg Intravenous Given 10/04/18 0828)  LORazepam (ATIVAN) 2 MG/ML injection (  Not Given 10/04/18 0831)  LORazepam (ATIVAN) injection 2 mg (2 mg Intravenous Given 10/04/18 0826)    Mobility oob with 1 assist

## 2018-10-04 NOTE — Progress Notes (Signed)
Rapid response called on pt. Pts HR was in the 170s, BP of 156/101, diaphoretic and had soiled himself. RN pushed 2 mg of ativan. Pts HR lowered to 120s and BP the 140s/70s. Spoke with Dr. Amado Coe if pt continues to remain in distress with the ativan pt is to be transferred to the ICU.

## 2018-10-04 NOTE — ED Notes (Signed)
Pt verbalized understanding of d/c instructions and f/u care. Provided with fresh toiletries. Assissted to the lobby, ambulated with steady gait.

## 2018-10-05 LAB — COMPREHENSIVE METABOLIC PANEL
ALT: 24 U/L (ref 0–44)
AST: 87 U/L — ABNORMAL HIGH (ref 15–41)
Albumin: 3.5 g/dL (ref 3.5–5.0)
Alkaline Phosphatase: 71 U/L (ref 38–126)
Anion gap: 7 (ref 5–15)
BUN: 6 mg/dL (ref 6–20)
CO2: 28 mmol/L (ref 22–32)
Calcium: 8.1 mg/dL — ABNORMAL LOW (ref 8.9–10.3)
Chloride: 101 mmol/L (ref 98–111)
Creatinine, Ser: 0.55 mg/dL — ABNORMAL LOW (ref 0.61–1.24)
GFR calc Af Amer: >60 mL/min (ref >60)
GFR calc non Af Amer: >60 mL/min (ref >60)
Glucose, Bld: 83 mg/dL (ref 70–99)
Potassium: 3.4 mmol/L — ABNORMAL LOW (ref 3.5–5.1)
Sodium: 136 mmol/L (ref 135–145)
Total Bilirubin: 1.9 mg/dL — ABNORMAL HIGH (ref 0.3–1.2)
Total Protein: 6.9 g/dL (ref 6.5–8.1)

## 2018-10-05 LAB — CBC
HEMATOCRIT: 35.1 % — AB (ref 39.0–52.0)
Hemoglobin: 12 g/dL — ABNORMAL LOW (ref 13.0–17.0)
MCH: 31.1 pg (ref 26.0–34.0)
MCHC: 34.2 g/dL (ref 30.0–36.0)
MCV: 90.9 fL (ref 80.0–100.0)
Platelets: 118 10*3/uL — ABNORMAL LOW (ref 150–400)
RBC: 3.86 MIL/uL — ABNORMAL LOW (ref 4.22–5.81)
RDW: 15 % (ref 11.5–15.5)
WBC: 4.1 10*3/uL (ref 4.0–10.5)
nRBC: 0 % (ref 0.0–0.2)

## 2018-10-05 MED ORDER — FOLIC ACID 1 MG PO TABS
1.0000 mg | ORAL_TABLET | Freq: Every day | ORAL | Status: DC
  Administered 2018-10-05 – 2018-10-06 (×2): 1 mg via ORAL
  Filled 2018-10-05 (×2): qty 1

## 2018-10-05 MED ORDER — LORAZEPAM 2 MG/ML IJ SOLN: 1.0000 mg | Freq: Four times a day (QID) | INTRAMUSCULAR | Status: DC | PRN

## 2018-10-05 MED ORDER — LORAZEPAM 2 MG/ML IJ SOLN
1.0000 mg | Freq: Four times a day (QID) | INTRAMUSCULAR | Status: DC | PRN
  Administered 2018-10-05: 1 mg via INTRAMUSCULAR
  Filled 2018-10-05: qty 1

## 2018-10-05 MED ORDER — ADULT MULTIVITAMIN W/MINERALS CH
1.0000 | ORAL_TABLET | Freq: Every day | ORAL | Status: DC
  Administered 2018-10-05 – 2018-10-09 (×4): 1 via ORAL
  Filled 2018-10-05 (×4): qty 1

## 2018-10-05 MED ORDER — LORAZEPAM 1 MG PO TABS: 1.0000 mg | ORAL_TABLET | Freq: Four times a day (QID) | ORAL | Status: DC | PRN

## 2018-10-05 MED ORDER — FOLIC ACID 5 MG/ML IJ SOLN
1.0000 mg | Freq: Every day | INTRAMUSCULAR | Status: DC
  Administered 2018-10-07: 1 mg via INTRAVENOUS
  Filled 2018-10-05 (×5): qty 0.2

## 2018-10-05 MED ORDER — POTASSIUM CHLORIDE CRYS ER 20 MEQ PO TBCR
40.0000 meq | EXTENDED_RELEASE_TABLET | Freq: Once | ORAL | Status: AC
  Administered 2018-10-05: 40 meq via ORAL
  Filled 2018-10-05: qty 2

## 2018-10-05 MED ORDER — LACTATED RINGERS IV SOLN
INTRAVENOUS | Status: DC
  Administered 2018-10-05 – 2018-10-07 (×7): via INTRAVENOUS

## 2018-10-05 NOTE — Progress Notes (Addendum)
Additional PRN dose of Ativan given for anxiety as order per doctor Sainani.

## 2018-10-05 NOTE — Plan of Care (Signed)
The patient has been given trying to get out of bed and walk yet he becomes very shaky. Ativan has been given. Two person assist to the bedside commode or when ambulating. The patient has only been able to ambulate 4ft. Social worker has been trying to find him placement.  Problem: Education: Goal: Knowledge of General Education information will improve Description Including pain rating scale, medication(s)/side effects and non-pharmacologic comfort measures Outcome: Progressing   Problem: Health Behavior/Discharge Planning: Goal: Ability to manage health-related needs will improve Outcome: Progressing   Problem: Clinical Measurements: Goal: Ability to maintain clinical measurements within normal limits will improve Outcome: Progressing Goal: Will remain free from infection Outcome: Progressing Goal: Diagnostic test results will improve Outcome: Progressing Goal: Respiratory complications will improve Outcome: Progressing Goal: Cardiovascular complication will be avoided Outcome: Progressing   Problem: Activity: Goal: Risk for activity intolerance will decrease Outcome: Progressing   Problem: Nutrition: Goal: Adequate nutrition will be maintained Outcome: Progressing   Problem: Coping: Goal: Level of anxiety will decrease Outcome: Progressing   Problem: Elimination: Goal: Will not experience complications related to bowel motility Outcome: Progressing Goal: Will not experience complications related to urinary retention Outcome: Progressing   Problem: Pain Managment: Goal: General experience of comfort will improve Outcome: Progressing   Problem: Safety: Goal: Ability to remain free from injury will improve Outcome: Progressing   Problem: Skin Integrity: Goal: Risk for impaired skin integrity will decrease Outcome: Progressing   Problem: Education: Goal: Knowledge of disease or condition will improve Outcome: Progressing Goal: Understanding of discharge needs  will improve Outcome: Progressing   Problem: Health Behavior/Discharge Planning: Goal: Ability to identify changes in lifestyle to reduce recurrence of condition will improve Outcome: Progressing Goal: Identification of resources available to assist in meeting health care needs will improve Outcome: Progressing   Problem: Physical Regulation: Goal: Complications related to the disease process, condition or treatment will be avoided or minimized Outcome: Progressing   Problem: Safety: Goal: Ability to remain free from injury will improve Outcome: Progressing

## 2018-10-05 NOTE — Progress Notes (Signed)
Messaged to MD. We tried walking with Mr. Kristopher Duran since he kept trying to get out off bed yet he is very shaky when trying to ambulate. He could not ambulate more than 69ft.

## 2018-10-05 NOTE — Clinical Social Work Note (Signed)
Clinical Social Work Assessment  Patient Details  Name: Kristopher Duran MRN: [id number] Date of Birth: 04-Apr-1966  Date of referral:  10/05/18               Reason for consult:  Care Management Concerns, Substance Use/ETOH Abuse, Community Resources, Discharge Planning                Permission sought to share information with:  Case Freight forwarder, Chartered certified accountant granted to share information::  Yes, Verbal Permission Granted  Name::     Geneticist, molecular::     Relationship::     Contact Information:     Housing/Transportation Living arrangements for the past 2 months:  No permanent address, Homeless Shelter Source of Information:  Patient Patient Interpreter Needed:  None Criminal Activity/Legal Involvement Pertinent to Current Situation/Hospitalization:  No - Comment as needed Significant Relationships:  Parents(Patient reported that his mother and father live in [place], [place]) Lives with:    Do you feel safe going back to the place where you live?  No(Patient is homeless.  Lives in his car. ) Need for family participation in patient care:  No (Coment)  Care giving concerns:  The patient lives in his car. Interested in substance abuse treatment/recovery programs.    Social Worker assessment / plan:  The patient is 53 y.o., African American male who was brought to the [facility] ED by county police. Patient was treated for hypothermia but after discharge he was readmitted for alcohol withdrawal. Clinician met with the patient at bedside. Pt gave permission to CSW to complete clinical assessment.  Patient reported that he has a long history of substance use.  Preferred substance of choice is Alcohol and ativan. Stated that received treatment for SA at [facility].  Was placed in a 30 day program.  Stated that "they could not keep me longer."  Noted that he relocated from [place] to [place], [place] 6 months ago.  Noted that he was living with a woman in [place] but due to  relationship issues he became homeless.  Stated he has been living in his car.  Has a part time job at [employer]. Interested in recovery programs. Clinician provided patient with [place] area resource for homeless: [employer], Boarding house [employer], [place] community meals programs. Patient showed interest in [facility] recovery program.   Plan: [employer] substance abuse and recovery program.   Employment status:  Part-Time(Works Chemical engineer at [employer]) [employer] information:   None PT Recommendations:    Information / Referral to community resources:  Residential Substance Abuse Treatment Options, Shelter([facility], [place], [place])  Patient/Family's Response to care: Patient is agreeable to discharge to substance abuse and recovery program.   Patient/Family's Understanding of and Emotional Response to Diagnosis, Current Treatment, and Prognosis:  Patient demonstrated good understanding of the plan.   Emotional Assessment Appearance:  Appears stated age Attitude/Demeanor/Rapport:  Engaged Affect (typically observed):  Calm, Sad Orientation:  Oriented to Self, Oriented to Place, Oriented to  Time, Oriented to Situation Alcohol / Substance use:  Alcohol Use(Heavy alcohol use) Psych involvement (Current and /or in the community):  No (Comment)  Discharge Needs  Concerns to be addressed:  Discharge Planning Concerns, Care Coordination, Homelessness, Substance Abuse Concerns, Lack of Support Readmission within the last 30 days:  No Current discharge risk:  Lack of support system, Homeless, Substance Abuse Barriers to Discharge:  Active Substance Use, Continued Medical Work up   [employer] I  Noralee Dutko, LCSW 10/05/2018, 10:02 AM

## 2018-10-05 NOTE — Progress Notes (Signed)
[facility] [place] [facility]     PATIENT NAME: Kristopher Duran    MR#:  [id number]  DATE OF BIRTH:  1966/05/26  SUBJECTIVE:   Patient admitted to the hospital secondary to increasing tremors and shakes and difficulty walking secondary to alcohol withdrawal.  Still has some significant tremors.  Patient is alert and oriented without any hallucinations or delusions.  REVIEW OF SYSTEMS:    Review of Systems  Constitutional: Negative for chills and fever.  HENT: Negative for congestion and tinnitus.   Eyes: Negative for blurred vision and double vision.  Respiratory: Negative for cough, shortness of breath and wheezing.   Cardiovascular: Negative for chest pain, orthopnea and PND.  Gastrointestinal: Negative for abdominal pain, diarrhea, nausea and vomiting.  Genitourinary: Negative for dysuria and hematuria.  Neurological: Positive for tremors. Negative for dizziness, sensory change and focal weakness.  All other systems reviewed and are negative.   Nutrition: Regular Tolerating Diet: Yes Tolerating PT: Await Eval.   DRUG ALLERGIES:  No Known Allergies  VITALS:  Blood pressure 111/86, pulse 82, temperature 98.7 F (37.1 C), temperature source Oral, resp. rate 20, height 5\' 9"  (1.753 m), weight 57 kg, SpO2 97 %.  PHYSICAL EXAMINATION:   Physical Exam  GENERAL:  53 y.o.-year-old thin unkempt patient lying in bed in NAD.  EYES: Pupils equal, round, reactive to light and accommodation. No scleral icterus. Extraocular muscles intact.  HEENT: Head atraumatic, normocephalic. Oropharynx and nasopharynx clear.  NECK:  Supple, no jugular venous distention. No thyroid enlargement, no tenderness.  LUNGS: Normal breath sounds bilaterally, no wheezing, rales, rhonchi. No use of accessory muscles of respiration.  CARDIOVASCULAR: S1, S2 normal. No murmurs, rubs, or gallops.  ABDOMEN: Soft, nontender, nondistended. Bowel sounds present. No organomegaly or mass.    EXTREMITIES: No cyanosis, clubbing or edema b/l.    NEUROLOGIC: Cranial nerves II through XII are intact. No focal Motor or sensory deficits b/l. Globally weak, + tremors  PSYCHIATRIC: The patient is alert and oriented x 3.  SKIN: No obvious rash, lesion, or ulcer.    LABORATORY PANEL:   CBC Recent Labs  Lab 10/05/18 0408  WBC 4.1  HGB 12.0*  HCT 35.1*  PLT 118*   ------------------------------------------------------------------------------------------------------------------  Chemistries  Recent Labs  Lab 10/05/18 0408  NA 136  K 3.4*  CL 101  CO2 28  GLUCOSE 83  BUN 6  CREATININE 0.55*  CALCIUM 8.1*  AST 87*  ALT 24  ALKPHOS 71  BILITOT 1.9*   ------------------------------------------------------------------------------------------------------------------  Cardiac Enzymes No results for input(s): TROPONINI in the last 168 hours. ------------------------------------------------------------------------------------------------------------------  RADIOLOGY:  No results found.   ASSESSMENT AND PLAN:   53 year old male with past medical history of alcohol abuse, tobacco abuse who presented to the hospital due to tremors and noted to be in alcohol withdrawal.  1.  Alcohol withdrawal-source of patient's tremors and unsteady gait. -Continue CIWA protocol, continue thiamine, folate. - We will get physical therapy evaluation.  2.  Hypothermia- patient presented to the hospital with very low temperature as he is homeless and was sleeping in his car.  Improved with supportive care and no longer has any hypothermia.  3.  Thrombocytopenia-secondary to alcohol abuse.  No acute bleeding.  We will continue to monitor.  4.  GERD-continue Pepcid.  5.  Hypokalemia-we will supplement and repeat level in the morning.  Check magnesium level.   All the records are reviewed and case discussed with Care Management/Social Worker. Management plans discussed with the  patient,  family and they are in agreement.  CODE STATUS: Full code  DVT Prophylaxis: Lovenox  TOTAL TIME TAKING CARE OF THIS PATIENT: 30 minutes.   POSSIBLE D/C IN 1-2 DAYS, DEPENDING ON CLINICAL CONDITION.   Kristopher Duran,Kristopher Duran J M.D on 10/05/2018 at 12:35 PM  Between 7am to 6pm - Pager - [phone]  After 6pm go to [email or web address] - Social research officer, governmentpassword EPAS [email or web address]  [facility]  Office  [phone]  CC: Primary care physician; Patient, No Pcp Per

## 2018-10-06 DIAGNOSIS — R4689 Other symptoms and signs involving appearance and behavior: Secondary | ICD-10-CM

## 2018-10-06 DIAGNOSIS — F10231 Alcohol dependence with withdrawal delirium: Principal | ICD-10-CM

## 2018-10-06 DIAGNOSIS — E43 Unspecified severe protein-calorie malnutrition: Secondary | ICD-10-CM

## 2018-10-06 LAB — GLUCOSE, CAPILLARY: Glucose-Capillary: 172 mg/dL — ABNORMAL HIGH (ref 70–99)

## 2018-10-06 LAB — HIV ANTIBODY (ROUTINE TESTING W REFLEX): HIV Screen 4th Generation wRfx: NONREACTIVE

## 2018-10-06 LAB — MAGNESIUM: MAGNESIUM: 1.7 mg/dL (ref 1.7–2.4)

## 2018-10-06 LAB — MRSA PCR SCREENING: MRSA by PCR: NEGATIVE

## 2018-10-06 LAB — POTASSIUM: Potassium: 3.3 mmol/L — ABNORMAL LOW (ref 3.5–5.1)

## 2018-10-06 MED ORDER — THIAMINE HCL 100 MG/ML IJ SOLN
500.0000 mg | Freq: Three times a day (TID) | INTRAVENOUS | Status: AC
  Administered 2018-10-06 – 2018-10-08 (×6): 500 mg via INTRAVENOUS
  Filled 2018-10-06 (×8): qty 5

## 2018-10-06 MED ORDER — DEXMEDETOMIDINE HCL IN NACL 200 MCG/50ML IV SOLN
0.4000 ug/kg/h | INTRAVENOUS | Status: DC
  Administered 2018-10-06 (×2): 1.2 ug/kg/h via INTRAVENOUS
  Filled 2018-10-06 (×2): qty 50

## 2018-10-06 MED ORDER — THIAMINE HCL 100 MG/ML IJ SOLN
250.0000 mg | Freq: Every day | INTRAVENOUS | Status: DC
  Filled 2018-10-06: qty 2.5

## 2018-10-06 MED ORDER — VITAMIN B-1 100 MG PO TABS: 100.0000 mg | ORAL_TABLET | Freq: Every day | ORAL | Status: DC

## 2018-10-06 MED ORDER — ENSURE ENLIVE PO LIQD
237.0000 mL | Freq: Three times a day (TID) | ORAL | Status: DC
  Administered 2018-10-07 – 2018-10-08 (×5): 237 mL via ORAL

## 2018-10-06 MED ORDER — HALOPERIDOL LACTATE 5 MG/ML IJ SOLN
2.6000 mg | Freq: Four times a day (QID) | INTRAMUSCULAR | Status: DC | PRN
  Administered 2018-10-06: 2.6 mg via INTRAVENOUS
  Filled 2018-10-06 (×2): qty 0.52

## 2018-10-06 MED ORDER — POTASSIUM CHLORIDE CRYS ER 20 MEQ PO TBCR
60.0000 meq | EXTENDED_RELEASE_TABLET | Freq: Once | ORAL | Status: AC
  Administered 2018-10-06: 60 meq via ORAL
  Filled 2018-10-06: qty 3

## 2018-10-06 MED ORDER — PHENOBARBITAL SODIUM 130 MG/ML IJ SOLN
130.0000 mg | Freq: Once | INTRAMUSCULAR | Status: AC
  Administered 2018-10-06: 130 mg via INTRAVENOUS
  Filled 2018-10-06 (×2): qty 1

## 2018-10-06 MED ORDER — DEXMEDETOMIDINE HCL IN NACL 400 MCG/100ML IV SOLN
0.4000 ug/kg/h | INTRAVENOUS | Status: DC
  Administered 2018-10-06: 1 ug/kg/h via INTRAVENOUS
  Administered 2018-10-07: 0.8 ug/kg/h via INTRAVENOUS
  Filled 2018-10-06: qty 100

## 2018-10-06 MED ORDER — LORAZEPAM 2 MG/ML IJ SOLN: 2.0000 mg | Freq: Once | INTRAMUSCULAR | Status: DC

## 2018-10-06 MED ORDER — MAGNESIUM SULFATE 2 GM/50ML IV SOLN
2.0000 g | Freq: Once | INTRAVENOUS | Status: AC
  Administered 2018-10-06: 2 g via INTRAVENOUS
  Filled 2018-10-06: qty 50

## 2018-10-06 MED ORDER — PHENOBARBITAL SODIUM 130 MG/ML IJ SOLN
130.0000 mg | Freq: Once | INTRAMUSCULAR | Status: AC
  Administered 2018-10-06: 130 mg via INTRAVENOUS

## 2018-10-06 NOTE — Progress Notes (Signed)
Initial Nutrition Assessment  DOCUMENTATION CODES:   Severe malnutrition in context of social or environmental circumstances  INTERVENTION:   Pt at high risk for Wernicke's; recommend 500 mg of thiamine intravenously, infused over 30 minutes, three times daily for two consecutive days and 250 mg intravenously once daily for an additional five days  Folic acid and MVI in setting of etoh abuse  Ensure Enlive po TID, each supplement provides 350 kcal and 20 grams of protein  Magic cup TID with meals, each supplement provides 290 kcal and 9 grams of protein  Pt at high risk for refeeding; recommend monitor K, Mg and P labs daily   NUTRITION DIAGNOSIS:   Severe Malnutrition related to social / environmental circumstances(etoh abuse ) as evidenced by moderate to severe fat depletions, moderate to severe muscle depletions.  GOAL:   Patient will meet greater than or equal to 90% of their needs  MONITOR:   PO intake, Supplement acceptance, Weight trends, Labs, Skin, I & O's  REASON FOR ASSESSMENT:   Malnutrition Screening Tool    ASSESSMENT:   53 y.o. male with a history of homelessness as well as alcohol abuse presents with alcohol withdrawal.    Met with pt in room today. Pt confused today and unable to provide any nutrition related history. Per chart review, pt with h/o etoh abuse and is homeless. Per NT, pt ate 100% of his breakfast today. There is no weight history in chart to determine if any recent weight loss. RD will add supplements to help pt meet his estimated needs. Pt on CIWA protocol. Recommend high dose thiamine as pt at high risk for wernicke's. Pt likely at high refeed risk; recommend monitor K, Mg and P labs daily until stable.   Medications reviewed and include: lovenox, pepcid, folic acid, thiamine, MVI, KCl, LRS '@50ml'$ /hr  Labs reviewed: K 3.3(L), Mg 1.7 wnl   NUTRITION - FOCUSED PHYSICAL EXAMS     Most Recent Value  Orbital Region  Mild depletion  Upper  Arm Region  Severe depletion  Thoracic and Lumbar Region  Moderate depletion  Buccal Region  Mild depletion  Temple Region  Mild depletion  Clavicle Bone Region  Severe depletion  Clavicle and Acromion Bone Region  Severe depletion  Scapular Bone Region  Moderate depletion  Dorsal Hand  Moderate depletion  Patellar Region  Severe depletion  Anterior Thigh Region  Severe depletion  Posterior Calf Region  Severe depletion  Edema (RD Assessment)  None  Hair  Reviewed  Eyes  Reviewed  Mouth  Reviewed  Skin  Reviewed  Nails  Reviewed     Diet Order:   Diet Order            Diet regular Room service appropriate? Yes; Fluid consistency: Thin  Diet effective now             EDUCATION NEEDS:   Not appropriate for education at this time  Skin:  Skin Assessment: Reviewed RN Assessment  Last BM:  1/19- type 6  Height:   Ht Readings from Last 1 Encounters:  10/06/18 '5\' 8"'$  (1.727 m)    Weight:   Wt Readings from Last 1 Encounters:  10/06/18 54.7 kg    Ideal Body Weight:  72.7 kg  BMI:  Body mass index is 18.34 kg/m.  Estimated Nutritional Needs:   Kcal:  1800-2100kcal/day   Protein:  85-96g/day   Fluid:  >1.7L/day   Koleen Distance MS, RD, LDN Pager #- [phone] Office#- [phone] After Hours  Pager: [phone]  

## 2018-10-06 NOTE — Progress Notes (Signed)
PT Cancellation Note  Patient Details Name: Charlette Caffeyracy Watlington MRN: [id number] DOB: 1966/07/01   Cancelled Treatment:    Reason Eval/Treat Not Completed: Other (comment).  PT consult received.  Chart reviewed.  Discussed pt's status with pt's nurse.  Pt's nurse recommending holding PT at this time (d/t pt's tremors/DT's).  Will re-attempt PT evaluation at a later date/time as medically appropriate.  Hendricks LimesEmily Jiah Bari, PT 10/06/18, 11:23 AM [phone]

## 2018-10-06 NOTE — Progress Notes (Signed)
PT Cancellation Note  Patient Details Name: Kristopher Duran MRN: [id number] DOB: 1966-03-22   Cancelled Treatment:    Reason Eval/Treat Not Completed: Medical issues which prohibited therapy.  Pt transferred to CCU 10/06/18 (no PT eval performed).  D/t pt transferring to higher level of care, per PT protocol require new PT consult in order to continue therapy (will discontinue current PT order d/t this).  Please re-consult PT when pt is medically appropriate to participate in PT.  Hendricks Limes, PT 10/06/18, 4:20 PM [phone]

## 2018-10-06 NOTE — Plan of Care (Addendum)
Called report to CCU for patient transfer.   Madie Reno, RN

## 2018-10-06 NOTE — Progress Notes (Signed)
[facility] - [place] [facility]     PATIENT NAME: Kristopher Duran    MR#:  [id number]  DATE OF BIRTH:  03-17-1966  SUBJECTIVE:   Still having symptoms of alcohol withdrawal with some tremor and hallucinations and attempting to get out of bed. Safety sitter at bedside.   REVIEW OF SYSTEMS:    Review of Systems  Unable to perform ROS: Mental acuity    Nutrition: Regular Tolerating Diet: Yes Tolerating PT: Await Eval.   DRUG ALLERGIES:  No Known Allergies  VITALS:  Blood pressure 140/80, pulse (!) 120, temperature 98 F (36.7 C), temperature source Oral, resp. rate 19, height 5\' 9"  (1.753 m), weight 57 kg, SpO2 100 %.  PHYSICAL EXAMINATION:   Physical Exam  GENERAL:  53 y.o.-year-old thin unkempt patient lying in bed in NAD.  EYES: Pupils equal, round, reactive to light and accommodation. No scleral icterus. Extraocular muscles intact.  HEENT: Head atraumatic, normocephalic. Oropharynx and nasopharynx clear.  NECK:  Supple, no jugular venous distention. No thyroid enlargement, no tenderness.  LUNGS: Normal breath sounds bilaterally, no wheezing, rales, rhonchi. No use of accessory muscles of respiration.  CARDIOVASCULAR: S1, S2 normal. No murmurs, rubs, or gallops.  ABDOMEN: Soft, nontender, nondistended. Bowel sounds present. No organomegaly or mass.  EXTREMITIES: No cyanosis, clubbing or edema b/l.    NEUROLOGIC: Cranial nerves II through XII are intact. No focal Motor or sensory deficits b/l. Globally weak, + tremors  PSYCHIATRIC: The patient is alert and oriented x 1.  SKIN: No obvious rash, lesion, or ulcer.    LABORATORY PANEL:   CBC Recent Labs  Lab 10/05/18 0408  WBC 4.1  HGB 12.0*  HCT 35.1*  PLT 118*   ------------------------------------------------------------------------------------------------------------------  Chemistries  Recent Labs  Lab 10/05/18 0408 10/06/18 0325  NA 136  --   K 3.4* 3.3*  CL 101  --   CO2 28   --   GLUCOSE 83  --   BUN 6  --   CREATININE 0.55*  --   CALCIUM 8.1*  --   MG  --  1.7  AST 87*  --   ALT 24  --   ALKPHOS 71  --   BILITOT 1.9*  --    ------------------------------------------------------------------------------------------------------------------  Cardiac Enzymes No results for input(s): TROPONINI in the last 168 hours. ------------------------------------------------------------------------------------------------------------------  RADIOLOGY:  No results found.   ASSESSMENT AND PLAN:   53 year old male with past medical history of alcohol abuse, tobacco abuse who presented to the hospital due to tremors and noted to be in alcohol withdrawal.  1.  Alcohol withdrawal-source of patient's tremors and unsteady gait/AMS -Patient continues to have significant hallucinations and agitation and attempts to get out of bed.  Continue safety sitter -Continue CIWA protocol, continue thiamine, folate.  2.  Hypothermia- patient presented to the hospital with very low temperature as he is homeless and was sleeping in his car.  Improved with supportive care and no longer has any hypothermia.  3.  Thrombocytopenia-secondary to alcohol abuse.  No acute bleeding.  We will continue to monitor.  4.  GERD-continue Pepcid.  5.  Hypokalemia- will supplement both potassium and Mg. Today and repeat in a.m.    All the records are reviewed and case discussed with Care Management/Social Worker. Management plans discussed with the patient, family and they are in agreement.  CODE STATUS: Full code  DVT Prophylaxis: Lovenox  TOTAL TIME TAKING CARE OF THIS PATIENT: 30 minutes.   POSSIBLE D/C IN 1-2  DAYS, DEPENDING ON CLINICAL CONDITION.   Houston SirenSAINANI,VIVEK J M.D on 10/06/2018 at 12:52 PM  Between 7am to 6pm - Pager - [phone]  After 6pm go to [email or web address] - Social research officer, governmentpassword [email or web address] [email or web address]  [facility]  Office  [phone]  CC: Primary care physician;  Patient, No Pcp Per

## 2018-10-06 NOTE — Consult Note (Signed)
PULMONARY/CCM CONSULT NOTE  Requesting MD/Service: Sainani Date of admission: 10/04/18 Date of initial consultation: 10/06/18 Reason for consultation: Alcohol withdrawal, severe agitation, DTs  PT PROFILE: 53 y.o. male alcoholic adm via ED to MedSurg floor with diagnosis of alcohol withdrawal syndrome.  Transferred to ICU/SDU for severe symptoms despite CIWA protocol   INDWELLING DEVICES::   MICRO DATA: MRSA PCR 1/20>> negative   ANTIMICROBIALS:     HPI:  Level 5 caveat.  Admission note reviewed in detail and hospital course discussed with Dr. Cherlynn KaiserSainani.  Past Medical History:  Diagnosis Date  . ETOH abuse     History reviewed. No pertinent surgical history.  MEDICATIONS: I have reviewed all medications and confirmed regimen as documented  Social History   Socioeconomic History  . Marital status: Legally Separated    Spouse name: Not on file  . Number of children: Not on file  . Years of education: Not on file  . Highest education level: Not on file  Occupational History  . Not on file  Social Needs  . Financial resource strain: Not on file  . Food insecurity:    Worry: Not on file    Inability: Not on file  . Transportation needs:    Medical: Not on file    Non-medical: Not on file  Tobacco Use  . Smoking status: Never Smoker  . Smokeless tobacco: Never Used  Substance and Sexual Activity  . Alcohol use: Yes  . Drug use: Not Currently  . Sexual activity: Not on file  Lifestyle  . Physical activity:    Days per week: Not on file    Minutes per session: Not on file  . Stress: Not on file  Relationships  . Social connections:    Talks on phone: Not on file    Gets together: Not on file    Attends religious service: Not on file    Active member of club or organization: Not on file    Attends meetings of clubs or organizations: Not on file    Relationship status: Not on file  . Intimate partner violence:    Fear of current or ex partner: Not on file     Emotionally abused: Not on file    Physically abused: Not on file    Forced sexual activity: Not on file  Other Topics Concern  . Not on file  Social History Narrative  . Not on file    History reviewed. No pertinent family history.  ROS: Level 5 caveat   Vitals:   10/05/18 2206 10/06/18 0400 10/06/18 1356 10/06/18 1600  BP: (!) 147/102 140/80 110/81 104/74  Pulse: (!) 125 (!) 120  93  Resp: 19 19  18   Temp: 98.3 F (36.8 C) 98 F (36.7 C) 97.8 F (36.6 C)   TempSrc: Oral Oral Oral   SpO2: 100% 100% 98% 94%  Weight:   54.7 kg   Height:   5\' 8"  (1.727 m)      EXAM:  Gen: Very agitated, poorly oriented, diaphoretic, combative HEENT: NCAT, sclera white, poor oral hygiene Neck: Supple without LAN, thyromegaly, JVD Lungs: breath sounds full without wheezes or other adventitious sounds Cardiovascular: Tachycardia, no murmur noted Abdomen: Soft, nontender, normal BS Ext: without clubbing, cyanosis, edema Neuro: CNs grossly intact, motor and sensory intact, DTR symmetric Skin: Limited exam, no lesions noted  DATA:   BMP Latest Ref Rng & Units 10/06/2018 10/05/2018 10/04/2018  Glucose 70 - 99 mg/dL - 83 161(W103(H)  BUN 6 -  20 mg/dL - 6 7  Creatinine 9.62 - 1.24 mg/dL - 9.52(W) 4.13  Sodium 135 - 145 mmol/L - 136 137  Potassium 3.5 - 5.1 mmol/L 3.3(L) 3.4(L) 3.9  Chloride 98 - 111 mmol/L - 101 99  CO2 22 - 32 mmol/L - 28 27  Calcium 8.9 - 10.3 mg/dL - 8.1(L) 8.9    CBC Latest Ref Rng & Units 10/05/2018 10/04/2018  WBC 4.0 - 10.5 K/uL 4.1 6.0  Hemoglobin 13.0 - 17.0 g/dL 12.0(L) 12.8(L)  Hematocrit 39.0 - 52.0 % 35.1(L) 36.6(L)  Platelets 150 - 400 K/uL 118(L) 121(L)    CXR: None available  I have personally reviewed all chest radiographs reported above including CXRs and CT chest unless otherwise indicated  IMPRESSION:   Chronic alcohol abuse Severe alcohol withdrawal syndrome Severe agitated delirium Combativeness   PLAN:  Transferred to ICU/SDU for  dexmedetomidine infusion Phenobarbital 130 mg administered x2 with excellent effect Continue CIWA protocol Monitor BMET intermittently Monitor I/Os Correct electrolytes as indicated ICU hemodynamic monitoring Advance diet and activity when able   Billy Fischer, MD PCCM service Mobile [phone] Pager [phone] 10/06/2018 4:22 PM

## 2018-10-06 NOTE — Progress Notes (Signed)
Patient transferred from floor for Precedex drip due to patient experiencing  alcohol withdrawal.  Patient was extremely agitated- Phenobarbital and ativan given per Dr. Sung Amabile and patient on max dose of Precedex drip.  Soft wrists restraints were started per Dr. Sung Amabile.  Patient now at this moment is resting well and sleeping- I removed the restraints approximately 1 hr after I applied them.  Patient vitals are stable at this time. Sitter at bedside.

## 2018-10-07 DIAGNOSIS — R451 Restlessness and agitation: Secondary | ICD-10-CM

## 2018-10-07 LAB — PHOSPHORUS: Phosphorus: 3.4 mg/dL (ref 2.5–4.6)

## 2018-10-07 LAB — COMPREHENSIVE METABOLIC PANEL
ALT: 19 U/L (ref 0–44)
AST: 48 U/L — ABNORMAL HIGH (ref 15–41)
Albumin: 3.3 g/dL — ABNORMAL LOW (ref 3.5–5.0)
Alkaline Phosphatase: 59 U/L (ref 38–126)
Anion gap: 8 (ref 5–15)
BUN: 8 mg/dL (ref 6–20)
CO2: 26 mmol/L (ref 22–32)
Calcium: 8.5 mg/dL — ABNORMAL LOW (ref 8.9–10.3)
Chloride: 101 mmol/L (ref 98–111)
Creatinine, Ser: 0.51 mg/dL — ABNORMAL LOW (ref 0.61–1.24)
GFR calc Af Amer: >60 mL/min (ref >60)
GFR calc non Af Amer: >60 mL/min (ref >60)
Glucose, Bld: 83 mg/dL (ref 70–99)
POTASSIUM: 2.8 mmol/L — AB (ref 3.5–5.1)
Sodium: 135 mmol/L (ref 135–145)
Total Bilirubin: 1.5 mg/dL — ABNORMAL HIGH (ref 0.3–1.2)
Total Protein: 6.6 g/dL (ref 6.5–8.1)

## 2018-10-07 LAB — CBC
HCT: 35.4 % — ABNORMAL LOW (ref 39.0–52.0)
Hemoglobin: 12.2 g/dL — ABNORMAL LOW (ref 13.0–17.0)
MCH: 31.5 pg (ref 26.0–34.0)
MCHC: 34.5 g/dL (ref 30.0–36.0)
MCV: 91.5 fL (ref 80.0–100.0)
Platelets: 130 10*3/uL — ABNORMAL LOW (ref 150–400)
RBC: 3.87 MIL/uL — ABNORMAL LOW (ref 4.22–5.81)
RDW: 13.9 % (ref 11.5–15.5)
WBC: 4.9 10*3/uL (ref 4.0–10.5)
nRBC: 0 % (ref 0.0–0.2)

## 2018-10-07 LAB — MAGNESIUM: Magnesium: 1.9 mg/dL (ref 1.7–2.4)

## 2018-10-07 MED ORDER — CIPROFLOXACIN HCL 0.3 % OP SOLN
1.0000 [drp] | OPHTHALMIC | Status: DC
  Administered 2018-10-07 – 2018-10-09 (×8): 1 [drp] via OPHTHALMIC
  Filled 2018-10-07 (×2): qty 2.5

## 2018-10-07 MED ORDER — ACETAMINOPHEN 500 MG PO TABS
500.0000 mg | ORAL_TABLET | Freq: Four times a day (QID) | ORAL | Status: DC | PRN
  Administered 2018-10-07: 21:00:00 500 mg via ORAL
  Filled 2018-10-07: qty 1

## 2018-10-07 MED ORDER — ENOXAPARIN SODIUM 40 MG/0.4ML ~~LOC~~ SOLN
40.0000 mg | SUBCUTANEOUS | Status: DC
  Administered 2018-10-07 – 2018-10-08 (×2): 40 mg via SUBCUTANEOUS
  Filled 2018-10-07 (×2): qty 0.4

## 2018-10-07 MED ORDER — POTASSIUM CHLORIDE 10 MEQ/100ML IV SOLN
10.0000 meq | INTRAVENOUS | Status: AC
  Administered 2018-10-07 (×6): 10 meq via INTRAVENOUS
  Filled 2018-10-07 (×6): qty 100

## 2018-10-07 NOTE — Progress Notes (Signed)
Patient ID: Kristopher Duran, male   DOB: 1966-06-01, 53 y.o.   MRN: [id number]  [facility] PROGRESS NOTE  Kristopher Duran YJE:[id number] DOB: 1966-07-22 DOA: 10/04/2018 PCP: Kristopher Duran  HPI/Subjective: Patient felt a little dizzy when he got up to go to the bathroom.  And was hypotensive at that time.  Objective: Vitals:   10/07/18 1200 10/07/18 1210  BP: (!) 79/58 102/64  Pulse:  90  Resp: 18 (!) 23  Temp:    SpO2:  96%    Filed Weights   10/04/18 0805 10/06/18 1230 10/06/18 1356  Weight: 57 kg 54.7 kg 54.7 kg    ROS: Review of Systems  Constitutional: Negative for chills and fever.  Eyes: Negative for blurred vision.  Respiratory: Negative for cough and shortness of breath.   Cardiovascular: Negative for chest pain.  Gastrointestinal: Negative for abdominal pain, constipation, diarrhea, nausea and vomiting.  Genitourinary: Negative for dysuria.  Musculoskeletal: Negative for joint pain.  Neurological: Negative for dizziness and headaches.   Exam: Physical Exam  HENT:  Nose: No mucosal edema.  Mouth/Throat: No oropharyngeal exudate or posterior oropharyngeal edema.  Eyes: Pupils are equal, round, and reactive to light. Conjunctivae and EOM are normal.  Some crusting bilateral eyelids  Neck: No JVD present. Carotid bruit is not present. No edema present. No thyroid mass and no thyromegaly present.  Cardiovascular: S1 normal and S2 normal. Exam reveals no gallop.  No murmur heard. Pulses:      Dorsalis pedis pulses are 2+ on the right side and 2+ on the left side.  Respiratory: No respiratory distress. He has decreased breath sounds in the right lower field and the left lower field. He has no wheezes. He has no rhonchi. He has no rales.  GI: Soft. Bowel sounds are normal. There is no abdominal tenderness.  Musculoskeletal:     Right ankle: He exhibits no swelling.     Left ankle: He exhibits no swelling.  Lymphadenopathy:    He has no cervical  adenopathy.  Neurological: He is alert. No cranial nerve deficit.  Skin: Skin is warm. No rash noted. Nails show no clubbing.  Psychiatric: He has a normal mood and affect.      Data Reviewed: Basic Metabolic Panel: Recent Labs  Lab 10/04/18 0812 10/05/18 0408 10/06/18 0325 10/07/18 0352  NA 137 136  --  135  K 3.9 3.4* 3.3* 2.8*  CL 99 101  --  101  CO2 27 28  --  26  GLUCOSE 103* 83  --  83  BUN 7 6  --  8  CREATININE 0.65 0.55*  --  0.51*  CALCIUM 8.9 8.1*  --  8.5*  MG  --   --  1.7 1.9  PHOS  --   --   --  3.4   Liver Function Tests: Recent Labs  Lab 10/04/18 0812 10/05/18 0408 10/07/18 0352  AST 110* 87* 48*  ALT 28 24 19   ALKPHOS 73 71 59  BILITOT 1.1 1.9* 1.5*  PROT 8.1 6.9 6.6  ALBUMIN 4.4 3.5 3.3*   CBC: Recent Labs  Lab 10/04/18 0812 10/05/18 0408 10/07/18 0352  WBC 6.0 4.1 4.9  HGB 12.8* 12.0* 12.2*  HCT 36.6* 35.1* 35.4*  MCV 88.0 90.9 91.5  PLT 121* 118* 130*    CBG: Recent Labs  Lab 10/06/18 1404  GLUCAP 172*    Recent Results (from the past 240 hour(s))  MRSA PCR Screening     Status: None  Collection Time: 10/06/18  2:10 PM  Result Value Ref Range Status   MRSA by PCR NEGATIVE NEGATIVE Final    Comment:        The GeneXpert MRSA Assay (FDA approved for NASAL specimens only), is one component of a comprehensive MRSA colonization surveillance program. It is not intended to diagnose MRSA infection nor to guide or monitor treatment for MRSA infections. Performed at [facility], [address] [place], [place] [address]       Scheduled Meds: . enoxaparin (LOVENOX) injection  40 mg Subcutaneous Q24H  . feeding supplement (ENSURE ENLIVE)  237 mL Oral TID BM  . folic acid  1 mg Intravenous Daily  . LORazepam  0-4 mg Intravenous Q12H   Or  . LORazepam  0-4 mg Oral Q12H  . LORazepam  0-4 mg Intravenous Q4H   Or  . LORazepam  0-4 mg Oral Q4H  . multivitamin with minerals  1 tablet Oral Daily  . [START ON  10/09/2018] thiamine  100 mg Oral Daily   Continuous Infusions: . lactated ringers 50 mL/hr at 10/07/18 1053  . [START ON 10/09/2018] thiamine injection    . thiamine injection 500 mg (10/07/18 1053)    Assessment/Plan:  1. Delirium tremens and alcohol withdrawal.  Patient received 2 doses of phenobarbital yesterday and is now off Precedex drip.  Patient able to answer some questions today.  Continue CIWA protocol. 2. Hypothermia.  Patient lives in his car. 3. Thrombocytopenia secondary to alcohol abuse 4. Hypokalemia.  Potassium and IV fluids.  Recheck electrolytes tomorrow. 5. Orthostatic hypotension with sitting up for commode.  Continue IV fluids.  Code Status:     Code Status Orders  (From admission, onward)         Start     Ordered   10/04/18 1246  Full code  Continuous     10/04/18 1245        Code Status History    Date Active Date Inactive Code Status Order ID Comments User Context   10/04/2018 0806 10/04/2018 1245 Full Code [id number]  Myrna Blazer, MD ED      Disposition Plan: To be determined  Time spent: 28 minutes  Zylpha Poynor [employer]
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
## 2018-10-07 NOTE — Progress Notes (Signed)
PULMONARY/CCM CONSULT NOTE  Requesting MD/Service: Sainani Date of admission: 10/04/18 Date of initial consultation: 10/06/18 Reason for consultation: Alcohol withdrawal, severe agitation, DTs  PT PROFILE: 53 y.o. male alcoholic adm via ED to MedSurg floor with diagnosis of alcohol withdrawal syndrome.  Transferred to ICU/SDU for severe symptoms despite CIWA protocol   INDWELLING DEVICES::   MICRO DATA: MRSA PCR 1/20>> negative   ANTIMICROBIALS:     SUBJ:  Very calm this morning.  Poorly oriented but cooperative.  No distress.  No new complaints.   Vitals:   10/07/18 1100 10/07/18 1200 10/07/18 1210 10/07/18 1300  BP: (!) 88/58 (!) 79/58 102/64 93/67  Pulse: 88  90 (!) 108  Resp: 15 18 (!) 23 (!) 24  Temp:   98.1 F (36.7 C)   TempSrc:   Oral   SpO2: 91%  96% 95%  Weight:      Height:         EXAM:   Gen: WDWN in NAD HEENT: NCAT, no scleral icterus, poor oral hygiene Neck: No LAN, no JVD noted Lungs: full BS, no adventitious sounds noted anteriorly Cardiovascular: Regular, normal rate, no M noted Abdomen: Soft, NT, +BS Ext: no C/C/E Neuro: PERRL, EOMI, motor/sensory grossly intact Skin: No lesions noted   DATA:   BMP Latest Ref Rng & Units 10/07/2018 10/06/2018 10/05/2018  Glucose 70 - 99 mg/dL 83 - 83  BUN 6 - 20 mg/dL 8 - 6  Creatinine 0.24 - 1.24 mg/dL 0.97(D) - 5.32(D)  Sodium 135 - 145 mmol/L 135 - 136  Potassium 3.5 - 5.1 mmol/L 2.8(L) 3.3(L) 3.4(L)  Chloride 98 - 111 mmol/L 101 - 101  CO2 22 - 32 mmol/L 26 - 28  Calcium 8.9 - 10.3 mg/dL 9.2(E) - 8.1(L)    CBC Latest Ref Rng & Units 10/07/2018 10/05/2018 10/04/2018  WBC 4.0 - 10.5 K/uL 4.9 4.1 6.0  Hemoglobin 13.0 - 17.0 g/dL 12.2(L) 12.0(L) 12.8(L)  Hematocrit 39.0 - 52.0 % 35.4(L) 35.1(L) 36.6(L)  Platelets 150 - 400 K/uL 130(L) 118(L) 121(L)    CXR: None available  I have personally reviewed all chest radiographs reported above including CXRs and CT chest unless otherwise  indicated  IMPRESSION:   Chronic alcohol abuse Severe alcohol withdrawal syndrome with severe agitated delirium and combativeness  Much improved after phenobarbital yesterday  Tolerating off dexmedetomidine today Severe protein-calorie malnutrition Hypokalemia  PLAN:  DC dexmedetomidine  Cont CIWA, lorazepam Advance diet and activity Monitor BMET intermittently Monitor I/Os Replete potassium Maintenance IVFs until taking full nutrition Transfer back to MedSurg with sitter at bedside  After transfer, PCCM will sign off. Please call if we can be of further assistance    Billy Fischer, MD PCCM service Mobile [phone] Pager [phone] 10/07/2018 2:45 PM

## 2018-10-08 LAB — CBC
HCT: 35.8 % — ABNORMAL LOW (ref 39.0–52.0)
Hemoglobin: 12 g/dL — ABNORMAL LOW (ref 13.0–17.0)
MCH: 30.8 pg (ref 26.0–34.0)
MCHC: 33.5 g/dL (ref 30.0–36.0)
MCV: 92 fL (ref 80.0–100.0)
NRBC: 0 % (ref 0.0–0.2)
Platelets: 155 10*3/uL (ref 150–400)
RBC: 3.89 MIL/uL — ABNORMAL LOW (ref 4.22–5.81)
RDW: 14.4 % (ref 11.5–15.5)
WBC: 6.9 10*3/uL (ref 4.0–10.5)

## 2018-10-08 LAB — BASIC METABOLIC PANEL
Anion gap: 9 (ref 5–15)
BUN: 8 mg/dL (ref 6–20)
CHLORIDE: 98 mmol/L (ref 98–111)
CO2: 28 mmol/L (ref 22–32)
Calcium: 8.7 mg/dL — ABNORMAL LOW (ref 8.9–10.3)
Creatinine, Ser: 0.61 mg/dL (ref 0.61–1.24)
GFR calc Af Amer: >60 mL/min (ref >60)
GFR calc non Af Amer: >60 mL/min (ref >60)
Glucose, Bld: 100 mg/dL — ABNORMAL HIGH (ref 70–99)
Potassium: 2.9 mmol/L — ABNORMAL LOW (ref 3.5–5.1)
Sodium: 135 mmol/L (ref 135–145)

## 2018-10-08 LAB — MAGNESIUM: Magnesium: 1.8 mg/dL (ref 1.7–2.4)

## 2018-10-08 LAB — POTASSIUM: Potassium: 3.8 mmol/L (ref 3.5–5.1)

## 2018-10-08 MED ORDER — POTASSIUM CHLORIDE 10 MEQ/100ML IV SOLN
10.0000 meq | INTRAVENOUS | Status: AC
  Administered 2018-10-08 (×2): 10 meq via INTRAVENOUS
  Filled 2018-10-08 (×2): qty 100

## 2018-10-08 MED ORDER — LORAZEPAM 2 MG PO TABS: 0.0000 mg | ORAL_TABLET | Freq: Two times a day (BID) | ORAL | Status: DC

## 2018-10-08 MED ORDER — POTASSIUM CHLORIDE CRYS ER 20 MEQ PO TBCR
40.0000 meq | EXTENDED_RELEASE_TABLET | ORAL | Status: AC
  Administered 2018-10-08 (×2): 40 meq via ORAL
  Filled 2018-10-08 (×2): qty 2

## 2018-10-08 MED ORDER — VITAMIN B-1 100 MG PO TABS: 100.0000 mg | ORAL_TABLET | Freq: Every day | ORAL | Status: DC

## 2018-10-08 MED ORDER — LORAZEPAM 2 MG/ML IJ SOLN: 0.0000 mg | Freq: Two times a day (BID) | INTRAMUSCULAR | Status: DC

## 2018-10-08 MED ORDER — VITAMIN B-1 100 MG PO TABS
250.0000 mg | ORAL_TABLET | Freq: Every day | ORAL | Status: DC
  Administered 2018-10-09: 250 mg via ORAL
  Filled 2018-10-08: qty 3

## 2018-10-08 MED ORDER — MAGNESIUM SULFATE 2 GM/50ML IV SOLN
2.0000 g | Freq: Once | INTRAVENOUS | Status: AC
  Administered 2018-10-08: 08:00:00 2 g via INTRAVENOUS
  Filled 2018-10-08: qty 50

## 2018-10-08 MED ORDER — FOLIC ACID 1 MG PO TABS
1.0000 mg | ORAL_TABLET | Freq: Every day | ORAL | Status: DC
  Administered 2018-10-08 – 2018-10-09 (×2): 1 mg via ORAL
  Filled 2018-10-08 (×2): qty 1

## 2018-10-08 MED ORDER — DILTIAZEM HCL ER COATED BEADS 180 MG PO CP24
180.0000 mg | ORAL_CAPSULE | Freq: Every day | ORAL | Status: DC
  Administered 2018-10-08 – 2018-10-09 (×2): 180 mg via ORAL
  Filled 2018-10-08 (×2): qty 1

## 2018-10-08 NOTE — Clinical Social Work Note (Signed)
Patient requested that CSW fax alcohol levels to [facility]. CSW faxed requested information to [place] at [facility] [phone]. CSW will continue to follow for transitions of care.   R[place]he Mannan MSW, [address] [phone]

## 2018-10-08 NOTE — Progress Notes (Signed)
Patient ID: Kristopher Duran, male   DOB: [birth year], 53 y.o.   MRN: [id number]   [employer] PROGRESS NOTE  Kristopher Duran GUY:[id number] DOB: 1966-06-30 DOA: 10/04/2018 PCP: Patient, No Pcp Per  HPI/Subjective: Patient feeling better today.  Ate his breakfast.  No complaints of nausea or vomiting or diarrhea.  No abdominal pain.  Mental status improved.  Objective: Vitals:   10/08/18 0700 10/08/18 0756  BP: (!) 131/8 131/80  Pulse:  78  Resp:  16  Temp:  99.3 F (37.4 C)  SpO2:  99%    Filed Weights   10/04/18 0805 10/06/18 1230 10/06/18 1356  Weight: 57 kg 54.7 kg 54.7 kg    ROS: Review of Systems  Constitutional: Negative for chills and fever.  Eyes: Negative for blurred vision.  Respiratory: Negative for cough and shortness of breath.   Cardiovascular: Negative for chest pain.  Gastrointestinal: Negative for abdominal pain, constipation, diarrhea, nausea and vomiting.  Genitourinary: Negative for dysuria.  Musculoskeletal: Negative for joint pain.  Neurological: Negative for dizziness and headaches.   Exam: Physical Exam  HENT:  Nose: No mucosal edema.  Mouth/Throat: No oropharyngeal exudate or posterior oropharyngeal edema.  Eyes: Pupils are equal, round, and reactive to light. Conjunctivae and EOM are normal.  Some crusting bilateral eyelids  Neck: No JVD present. Carotid bruit is not present. No edema present. No thyroid mass and no thyromegaly present.  Cardiovascular: S1 normal and S2 normal. Exam reveals no gallop.  No murmur heard. Pulses:      Dorsalis pedis pulses are 2+ on the right side and 2+ on the left side.  Respiratory: No respiratory distress. He has decreased breath sounds in the right lower field and the left lower field. He has no wheezes. He has no rhonchi. He has no rales.  GI: Soft. Bowel sounds are normal. There is no abdominal tenderness.  Musculoskeletal:     Right ankle: He exhibits no swelling.     Left ankle: He exhibits no  swelling.  Lymphadenopathy:    He has no cervical adenopathy.  Neurological: He is alert. No cranial nerve deficit.  Skin: Skin is warm. No rash noted. Nails show no clubbing.  Psychiatric: He has a normal mood and affect.      Data Reviewed: Basic Metabolic Panel: Recent Labs  Lab 10/04/18 0812 10/05/18 0408 10/06/18 0325 10/07/18 0352 10/08/18 0424  NA 137 136  --  135 135  K 3.9 3.4* 3.3* 2.8* 2.9*  CL 99 101  --  101 98  CO2 27 28  --  26 28  GLUCOSE 103* 83  --  83 100*  BUN 7 6  --  8 8  CREATININE 0.65 0.55*  --  0.51* 0.61  CALCIUM 8.9 8.1*  --  8.5* 8.7*  MG  --   --  1.7 1.9 1.8  PHOS  --   --   --  3.4  --    Liver Function Tests: Recent Labs  Lab 10/04/18 0812 10/05/18 0408 10/07/18 0352  AST 110* 87* 48*  ALT 28 24 19   ALKPHOS 73 71 59  BILITOT 1.1 1.9* 1.5*  PROT 8.1 6.9 6.6  ALBUMIN 4.4 3.5 3.3*   CBC: Recent Labs  Lab 10/04/18 0812 10/05/18 0408 10/07/18 0352 10/08/18 0424  WBC 6.0 4.1 4.9 6.9  HGB 12.8* 12.0* 12.2* 12.0*  HCT 36.6* 35.1* 35.4* 35.8*  MCV 88.0 90.9 91.5 92.0  PLT 121* 118* 130* 155    CBG: Recent Labs  Lab 10/06/18 1404  GLUCAP 172*    Recent Results (from the past 240 hour(s))  MRSA PCR Screening     Status: None   Collection Time: 10/06/18  2:10 PM  Result Value Ref Range Status   MRSA by PCR NEGATIVE NEGATIVE Final    Comment:        The GeneXpert MRSA Assay (FDA approved for NASAL specimens only), is one component of a comprehensive MRSA colonization surveillance program. It is not intended to diagnose MRSA infection nor to guide or monitor treatment for MRSA infections. Performed at [facility], [address] [place], [place] [address]       Scheduled Meds: . ciprofloxacin  1 drop Both Eyes Q4H while awake  . enoxaparin (LOVENOX) injection  40 mg Subcutaneous Q24H  . feeding supplement (ENSURE ENLIVE)  237 mL Oral TID BM  . folic acid  1 mg Oral Daily  . LORazepam  0-4 mg  Intravenous Q12H   Or  . LORazepam  0-4 mg Oral Q12H  . LORazepam  0-4 mg Intravenous Q12H   Or  . LORazepam  0-4 mg Oral Q12H  . multivitamin with minerals  1 tablet Oral Daily  . [START ON 10/09/2018] thiamine  250 mg Oral Daily   Followed by  . [START ON 10/14/2018] thiamine  100 mg Oral Daily   Continuous Infusions:   Assessment/Plan:  1. Hypokalemia.  Replace potassium IV and orally today.  Recheck this afternoon and replace if low.  Recheck tomorrow morning.   2. Delirium tremens and alcohol withdrawal.  Patient received 2 doses of phenobarbital 2 days ago.  Patient doing better.  Check an ultrasound of the abdomen tomorrow to look for liver cirrhosis. 3. Hypothermia.  Patient lives in his car. 4. Thrombocytopenia secondary to alcohol abuse 5. Orthostatic hypotension yesterday.  Continue to check orthostatic vitals.  Code Status:     Code Status Orders  (From admission, onward)         Start     Ordered   10/04/18 1246  Full code  Continuous     10/04/18 1245        Code Status History    Date Active Date Inactive Code Status Order ID Comments User Context   10/04/2018 0806 10/04/2018 1245 Full Code [id number]  Myrna Blazer, MD ED      Disposition Plan: Potentially discharge tomorrow  Time spent: 28 minutes  Kasumi Ditullio [employer]

## 2018-10-08 NOTE — Consult Note (Signed)
PHARMACY CONSULT NOTE - FOLLOW UP  Pharmacy Consult for Electrolyte Monitoring and Replacement   Recent Labs: Potassium (mmol/L)  Date Value  10/08/2018 3.8   Magnesium (mg/dL)  Date Value  51/76/1607 1.8   Calcium (mg/dL)  Date Value  37/06/6268 8.7 (L)   Albumin (g/dL)  Date Value  48/54/6270 3.3 (L)   Phosphorus (mg/dL)  Date Value  35/00/9381 3.4   Sodium (mmol/L)  Date Value  10/08/2018 135   Assessment: Pharmacy consulted for electrolyte monitoring and replacement in 76 you male admitted with delirium tremens and alcohol withdrawal.  Potassium this morning 3.8 and Magnesium 1.8.   Goal of Therapy:  Electrolytes WNL   Plan:  1/22 Patient received KCL IV x 2,  KCL x2 PO, and Magnesium 2g IV x 1.   F/U K+ level @ 1400 was 3.8. No additional replacement needed at this time. Will recheck electrolyte with AM labs and continue to replace as needed.   Gardner Candle, PharmD, BCPS Clinical Pharmacist 10/08/2018 2:49 PM

## 2018-10-09 LAB — BASIC METABOLIC PANEL
Anion gap: 8 (ref 5–15)
BUN: 7 mg/dL (ref 6–20)
CO2: 29 mmol/L (ref 22–32)
Calcium: 8.9 mg/dL (ref 8.9–10.3)
Chloride: 96 mmol/L — ABNORMAL LOW (ref 98–111)
Creatinine, Ser: 0.59 mg/dL — ABNORMAL LOW (ref 0.61–1.24)
GFR calc Af Amer: >60 mL/min (ref >60)
GFR calc non Af Amer: >60 mL/min (ref >60)
GLUCOSE: 105 mg/dL — AB (ref 70–99)
Potassium: 4.1 mmol/L (ref 3.5–5.1)
Sodium: 133 mmol/L — ABNORMAL LOW (ref 135–145)

## 2018-10-09 LAB — MAGNESIUM: Magnesium: 1.9 mg/dL (ref 1.7–2.4)

## 2018-10-09 LAB — PHOSPHORUS: PHOSPHORUS: 3 mg/dL (ref 2.5–4.6)

## 2018-10-09 MED ORDER — ENSURE ENLIVE PO LIQD: 237.0000 mL | Freq: Three times a day (TID) | ORAL | 0 refills | Status: DC

## 2018-10-09 MED ORDER — DILTIAZEM HCL ER COATED BEADS 180 MG PO CP24: 180.0000 mg | ORAL_CAPSULE | Freq: Every day | ORAL | 0 refills | Status: DC

## 2018-10-09 MED ORDER — THIAMINE HCL 100 MG PO TABS: 100.0000 mg | ORAL_TABLET | Freq: Every day | ORAL | 0 refills | Status: DC

## 2018-10-09 MED ORDER — FOLIC ACID 1 MG PO TABS: 1.0000 mg | ORAL_TABLET | Freq: Every day | ORAL | 0 refills | Status: DC

## 2018-10-09 NOTE — Progress Notes (Signed)
Patient was in such a rush to get out of hospital that prescriptions with found by cleaning staff in room , MD aware.
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
## 2018-10-09 NOTE — Consult Note (Signed)
PHARMACY CONSULT NOTE - FOLLOW UP  Pharmacy Consult for Electrolyte Monitoring and Replacement   Recent Labs: Potassium (mmol/L)  Date Value  10/09/2018 4.1   Magnesium (mg/dL)  Date Value  17/51/0258 1.9   Calcium (mg/dL)  Date Value  52/77/8242 8.9   Albumin (g/dL)  Date Value  35/36/1443 3.3 (L)   Phosphorus (mg/dL)  Date Value  15/40/0867 3.0   Sodium (mmol/L)  Date Value  10/09/2018 133 (L)   Assessment: Pharmacy consulted for electrolyte monitoring and replacement in 52 you male admitted with delirium tremens and alcohol withdrawal.  Potassium this morning 3.8 and Magnesium 1.8.   Goal of Therapy:  Electrolytes WNL   Plan:  1/23 Electrolytes WNL this morning.  No replacement needed at this time.  Will recheck electrolyte with AM labs and continue to replace as needed.   Gardner Candle, PharmD, BCPS Clinical Pharmacist 10/09/2018 8:01 AM

## 2018-10-09 NOTE — Discharge Instructions (Signed)
Alcohol Abuse and Nutrition Alcohol abuse is any pattern of alcohol consumption that harms your health, relationships, or work. Alcohol abuse can cause poor nutrition (malnutrition or malnourishment) and a lack of nutrients (nutrient deficiencies), which can lead to more complications. Alcohol abuse brings malnutrition and nutrient deficiencies in two ways:  It causes your liver to work abnormally. This affects how your body divides (breaks down) and absorbs nutrients from food.  It causes you to eat poorly. Many people who abuse alcohol do not eat enough carbohydrates, protein, fat, vitamins, and minerals. Nutrients that are commonly lacking (deficient) in people who abuse alcohol include:  Vitamins. ? Vitamin A. This is needed for your vision, metabolism, and ability to fight off infections (immunity). ? B vitamins. These include folate, thiamine, and niacin. These are needed for new cell growth. ? Vitamin C. This plays an important role in wound healing, immunity, and helping your body to absorb iron. ? Vitamin D. This is necessary for your body to absorb and use calcium. It is produced by your liver, but you can also get it from food and from sun exposure.  Minerals. ? Calcium. This is needed for healthy bones as well as heart and blood vessel (cardiovascular) function. ? Iron. This is important for blood, muscle, and nervous system functioning. ? Magnesium. This plays an important role in muscle and nerve function, and it helps to control blood sugar and blood pressure. ? Zinc. This is important for the normal functioning of your nervous system and digestive system (gastrointestinal tract). If you think that you have an alcohol dependency problem, or if it is hard to stop drinking because you feel sick or different when you do not use alcohol, talk with your health care provider or another health professional about where to get help. Nutrition is an essential factor in therapy for alcohol  abuse. Your health care provider or diet and nutrition specialist (dietitian) will work with you to design a plan that can help to restore nutrients to your body and prevent the risk of complications. What is my plan? Your dietitian may develop a specific eating plan that is based on your condition and any other problems that you have. An eating plan will commonly include:  A balanced diet. ? Grains: 6-8 oz (170-227 g) a day. Examples of 1 oz of whole grains include 1 cup of whole-wheat cereal,  cup of brown rice, or 1 slice of whole-wheat bread. ? Vegetables: 2-3 cups a day. Examples of 1 cup of vegetables include 2 medium carrots, 1 large tomato, or 2 stalks of celery. ? Fruits: 1-2 cups a day. Examples of 1 cup of fruit include 1 large banana, 1 small apple, 8 large strawberries, or 1 large orange. ? Meat and other protein: 5-6 oz (142-170 g) a day.  A cut of meat or fish that is the size of a deck of cards is about 3-4 oz.  Foods that provide 1 oz of protein include 1 egg,  cup of nuts or seeds, or 1 tablespoon (16 g) of peanut butter. ? Dairy: 2-3 cups a day. Examples of 1 cup of dairy include 8 oz (230 mL) of milk, 8 oz (230 g) of yogurt, or 1 oz (44 g) of natural cheese.  Vitamin and mineral supplements. What are tips for following this plan?  Eat frequent meals and snacks. Try to eat 5-6 small meals each day.  Take vitamin or mineral supplements as recommended by your dietitian.  If you are malnourished  or if your dietitian recommends it: ? You may follow a high-protein, high-calorie diet. This may include:  2,000-3,000 calories (kilocalories) a day.  70-100 g (grams) of protein a day. ? You may be directed to follow a diet that includes a complete nutritional supplement beverage. This can help to restore calories, protein, and vitamins to your body. Depending on your condition, you may be advised to consume this beverage instead of your meals or in addition to them.  Certain  medicines may cause changes in your appetite, taste, and weight. Work with your health care provider and dietitian to make any changes to your medicines and eating plan.  If you are unable to take in enough food and calories by mouth, your health care provider may recommend a feeding tube. This tube delivers nutritional supplements directly to your stomach. Recommended foods  Eat foods that are high in molecules that prevent oxygen from reacting with your food (antioxidants). These foods include grapes, berries, nuts, green tea, and dark green or orange vegetables. Eating these can help to prevent some of the stress that is placed on your liver by consuming alcohol.  Eat a variety of fresh fruits and vegetables each day. This will help you to get fiber and vitamins in your diet.  Drink plenty of water and other clear fluids, such as apple juice and broth. Try to drink at least 48-64 oz (1.5-2 L) of water a day.  Include foods fortified with vitamins and minerals in your diet. Commonly fortified foods include milk, orange juice, cereal, and bread.  Eat a variety of foods that are high in omega-3 and omega-6 fatty acids. These include fish, nuts and seeds, and soybeans. These foods may help your liver to recover and may also stabilize your mood.  If you are a vegetarian: ? Eat a variety of protein-rich foods. ? Pair whole grains with plant-based proteins at meals and snack time. For example, eat rice with beans, put peanut butter on whole-grain toast, or eat oatmeal with sunflower seeds. The items listed above may not be a complete list of foods and beverages you can eat. Contact a dietitian for more information. Foods to avoid  Avoid foods and drinks that are high in fat and sugar. Sugary drinks, salty snacks, and candy contain empty calories. This means that they lack important nutrients such as protein, fiber, and vitamins.  Avoid alcohol. This is the best way to avoid malnutrition due to  alcohol abuse. If you must drink, drink measured amounts. Measured drinking means limiting your intake to no more than 1 drink a day for nonpregnant women and 2 drinks a day for men. One drink equals 12 oz (355 mL) of beer, 5 oz (148 mL) of wine, or 1 oz (44 mL) of hard liquor.  Limit your intake of caffeine. Replace drinks like coffee and black tea with decaffeinated coffee and decaffeinated herbal tea. The items listed above may not be a complete list of foods and beverages you should avoid. Contact a dietitian for more information. Summary  Alcohol abuse can cause poor nutrition (malnutrition or malnourishment) and a lack of nutrients (nutrient deficiencies), which can lead to more health problems.  Common nutrient deficiencies include vitamin deficiencies (A, B, C, and D) and mineral deficiencies (calcium, iron, magnesium, and zinc).  Nutrition is an essential factor in therapy for alcohol abuse.  Your health care provider and dietitian can help you to develop a specific eating plan that includes a balanced diet plus vitamin and  mineral supplements. °This information is not intended to replace advice given to you by your health care provider. Make sure you discuss any questions you have with your health care provider. °Document Released: 06/28/2005 Document Revised: 05/07/2018 Document Reviewed: 05/21/2017 °Elsevier Interactive Patient Education © 2019 [employer] ° ° °Alcohol Withdrawal Syndrome °When a person who drinks a lot of alcohol stops drinking, he or she may have unpleasant and serious symptoms. These symptoms are called alcohol withdrawal syndrome. This condition may be mild or severe. It can be life-threatening. It can cause: °· Shaking that you cannot control (tremor). °· Sweating. °· Headache. °· Feeling fearful, upset, grouchy, or depressed. °· Trouble sleeping (insomnia). °· Nightmares. °· Fast or uneven heartbeats (palpitations). °· Alcohol cravings. °· Feeling sick to your  stomach (nausea). °· Throwing up (vomiting). °· Being bothered by light and sounds. °· Confusion. °· Trouble thinking clearly. °· Not being hungry (loss of appetite). °· Big changes in mood (mood swings). °If you have all of the following symptoms at the same time, get help right away: °· High blood pressure. °· Fast heartbeat. °· Trouble breathing. °· Seizures. °· Seeing, hearing, feeling, smelling, or tasting things that are not there (hallucinations). °These symptoms are known as delirium tremens (DTs). They must be treated at the hospital right away. °Follow these instructions at home: ° °· Take over-the-counter and prescription medicines only as told by your doctor. This includes vitamins. °· Do not drink alcohol. °· Do not drive until your doctor says that this is safe for you. °· Have someone stay with you or be available in case you need help. This should be someone you trust. This person can help you with your symptoms. He or she can also help you to not drink. °· Drink enough fluid to keep your pee (urine) pale yellow. °· Think about joining a support group or a treatment program to help you stop drinking. °· Keep all follow-up visits as told by your doctor. This is important. °Contact a doctor if: °· Your symptoms get worse. °· You cannot eat or drink without throwing up. °· You have a hard time not drinking alcohol. °· You cannot stop drinking alcohol. °Get help right away if: °· You have fast or uneven heartbeats. °· You have chest pain. °· You have trouble breathing. °· You have a seizure for the first time. °· You see, hear, feel, smell, or taste something that is not there. °· You get very confused. °Summary °· When a person who drinks a lot of alcohol stops drinking, he or she may have serious symptoms. This is called alcohol withdrawal syndrome. °· Delirium tremens (DTs) is a group of life-threatening symptoms. You should get help right away if you have these symptoms. °· Think about joining an  alcohol support group or a treatment program. °This information is not intended to replace advice given to you by your health care provider. Make sure you discuss any questions you have with your health care provider. °Document Released: 02/20/2008 Document Revised: 05/10/2017 Document Reviewed: 05/10/2017 °Elsevier Interactive Patient Education © 2019 Elsevier Inc. ° °

## 2018-10-09 NOTE — Discharge Summary (Signed)
[facility] - [place] [facility]   PATIENT NAME: Kristopher Duran    MR#:  [id number]  DATE OF BIRTH:  29-Aug-1966  DATE OF ADMISSION:  10/04/2018 ADMITTING PHYSICIAN: Ramonita LabAruna Gouru, MD  DATE OF DISCHARGE: 10/09/2018 10:51 AM  PRIMARY CARE PHYSICIAN: Patient, No Pcp Per    ADMISSION DIAGNOSIS:  Alcohol withdrawal syndrome without complication (HCC) [F10.230]  DISCHARGE DIAGNOSIS:  Active Problems:   Alcohol withdrawal delirium (HCC)   Protein-calorie malnutrition, severe   SECONDARY DIAGNOSIS:   Past Medical History:  Diagnosis Date  . ETOH abuse     HOSPITAL COURSE:   1.  Delirium tremens and alcohol withdrawal.  The patient received 2 doses of phenobarbital and was placed on Precedex drip during the hospital course.  He is doing much better with regards to his mental status.  No need for any tapering doses of medications upon discharge home.  Patient stated that he is only got a drink on special occasions.  I advised him to stop drinking altogether or else he is can end up back in the hospital again. 2.  Hypothermia.  Patient lives in his car.  He [place] [facility] [place] [facility] or [place] to live with some friends. 3.  Hypokalemia, hypomagnesemia.  The patient was replaced on potassium and magnesium during the hospital course. 4.  Thrombocytopenia secondary to alcohol abuse.  I tried to get an ultrasound of the liver to image the liver to see if he has any signs of cirrhosis or not.  The patient refused this test. 5.  SVT.  Patient started on Cardizem CD. 6.  Orthostatic hypotension while in the ICU.  This has resolved. 7.  Severe protein calorie malnutrition 8.  Conjunctivitis was treated during the hospital course with Ciloxan eyedrops.  Eyes are much better upon discharge.  Stop this medication.  Nursing staff notified me that the patient left without his prescriptions.  DISCHARGE CONDITIONS:   Fair  CONSULTS OBTAINED:  Critical care  specialist  DRUG ALLERGIES:  No Known Allergies  DISCHARGE MEDICATIONS:   Allergies as of 10/09/2018   No Known Allergies     Medication List    TAKE these medications   diltiazem 180 MG 24 hr capsule Commonly known as:  CARDIZEM CD Take 1 capsule (180 mg total) by mouth daily. Start taking on:  October 10, 2018   feeding supplement (ENSURE ENLIVE) Liqd Take 237 mLs by mouth 3 (three) times daily between meals.   folic acid 1 MG tablet Commonly known as:  FOLVITE Take 1 tablet (1 mg total) by mouth daily. Start taking on:  October 10, 2018   thiamine 100 MG tablet Take 1 tablet (100 mg total) by mouth daily. Start taking on:  October 14, 2018        DISCHARGE INSTRUCTIONS:   Follow-up [facility] 3 weeks  If you experience worsening of your admission symptoms, develop shortness of breath, life threatening emergency, suicidal or homicidal thoughts you must seek medical attention immediately by calling 911 or calling your MD immediately  if symptoms less severe.  You Must read complete instructions/literature along with all the possible adverse reactions/side effects for all the Medicines you take and that have been prescribed to you. Take any new Medicines after you have completely understood and accept all the possible adverse reactions/side effects.   Please note  You were cared for by a hospitalist during your hospital stay. If you have any questions about your  discharge medications or the care you received while you were in the hospital after you are discharged, you can call the unit and asked to speak with the hospitalist on call if the hospitalist that took care of you is not available. Once you are discharged, your primary care physician will handle any further medical issues. Please note that NO REFILLS for any discharge medications will be authorized once you are discharged, as it is imperative that you return to your primary care physician (or establish a  relationship with a primary care physician if you do not have one) for your aftercare needs so that they can reassess your need for medications and monitor your lab values.    Today   CHIEF COMPLAINT:   Chief Complaint  Patient presents with  . Delirium Tremens (DTS)    HISTORY OF PRESENT ILLNESS:  Kristopher Duran  is a 53 y.o. male alcohol abuse presented with alcohol withdrawal and hypothermia   VITAL SIGNS:  Blood pressure 115/72, pulse 93, temperature 99.1 F (37.3 C), temperature source Oral, resp. rate 16, height 5\' 8"  (1.727 m), weight 54.7 kg, SpO2 98 %.   PHYSICAL EXAMINATION:  GENERAL:  53 y.o.-year-old patient lying in the bed with no acute distress.  EYES: Pupils equal, round, reactive to light and accommodation. No scleral icterus. Extraocular muscles intact.  HEENT: Head atraumatic, normocephalic. Oropharynx and nasopharynx clear.  NECK:  Supple, no jugular venous distention. No thyroid enlargement, no tenderness.  LUNGS: Normal breath sounds bilaterally, no wheezing, rales,rhonchi or crepitation. No use of accessory muscles of respiration.  CARDIOVASCULAR: S1, S2 normal. No murmurs, rubs, or gallops.  ABDOMEN: Soft, non-tender, non-distended. Bowel sounds present. No organomegaly or mass.  EXTREMITIES: No pedal edema, cyanosis, or clubbing.  NEUROLOGIC: Cranial nerves II through XII are intact. Muscle strength 5/5 in all extremities. Sensation intact. Gait not checked.  PSYCHIATRIC: The patient is alert and oriented x 3.  SKIN: No obvious rash, lesion, or ulcer.   DATA REVIEW:   CBC Recent Labs  Lab 10/08/18 0424  WBC 6.9  HGB 12.0*  HCT 35.8*  PLT 155    Chemistries  Recent Labs  Lab 10/07/18 0352  10/09/18 0720  NA 135   < > 133*  K 2.8*   < > 4.1  CL 101   < > 96*  CO2 26   < > 29  GLUCOSE 83   < > 105*  BUN 8   < > 7  CREATININE 0.51*   < > 0.59*  CALCIUM 8.5*   < > 8.9  MG 1.9   < > 1.9  AST 48*  --   --   ALT 19  --   --   ALKPHOS  59  --   --   BILITOT 1.5*  --   --    < > = values in this interval not displayed.    Microbiology Results  Results for orders placed or performed during the hospital encounter of 10/04/18  MRSA PCR Screening     Status: None   Collection Time: 10/06/18  2:10 PM  Result Value Ref Range Status   MRSA by PCR NEGATIVE NEGATIVE Final    Comment:        The GeneXpert MRSA Assay (FDA approved for NASAL specimens only), is one component of a comprehensive MRSA colonization surveillance program. It is not intended to diagnose MRSA infection nor to guide or monitor treatment for MRSA infections. Performed at [facility], [phone]  [address], [place], [place] [address]       Management plans discussed with the patient, and he is in agreement.  CODE STATUS:  Code Status History    Date Active Date Inactive Code Status Order ID Comments User Context   10/04/2018 1246 10/09/2018 1356 Full Code [id number]  Ramonita Lab, MD Inpatient   10/04/2018 0806 10/04/2018 1245 Full Code [id number]  Schaevitz, Myra Rude, MD ED      TOTAL TIME TAKING CARE OF THIS PATIENT: 35 minutes.    Alford Highland M.D on 10/09/2018 at 1:57 PM  Between 7am to 6pm - Pager - [phone]  After 6pm go to [email or web address] - password [email or web address] [facility]  [facility] Office  [phone]  CC: Primary care physician; Patient, No Pcp Per

## 2018-10-09 NOTE — Care Management Note (Signed)
Case Management Note  Patient Details  Name: Kristopher Duran MRN: [id number] Date of Birth: 06-Jun-1966  Subjective/Objective:    Patient admitted with alcohol withdrawal.  Patient is homeless and lives in his car and he reports drinking everyday.  LCSW has arranged for patient to go to [facility] for alcohol rehab.  Patient will drive himself to rehab at discharge.  RNCM consulted for medication assistance.  Medication Management application given to patient, patient reports that he is already established with [facility] and he should follow up in 3 weeks.  Patient states he knows where medication management is and he will take his scripts over to them himself.  RNCM offered to fax the prescriptions and call but patient states he wants to take care of it himself.  Prescriptions given to patient.  RNCM signed off [name] RN BSN [id number]                   Action/Plan:   Expected Discharge Date:  10/09/18               Expected Discharge Plan:  IP Rehab Facility([facility])  In-House Referral:  Clinical Social Work  Discharge planning Services  CM Consult  Post Acute Care Choice:    Choice offered to:     DME Arranged:    DME Agency:     HH Arranged:    HH Agency:     Status of Service:  Completed, signed off  If discussed at [employer], dates discussed:    Additional Comments:  Allayne Butcher, RN 10/09/2018, 10:08 AM

## 2018-10-09 NOTE — Progress Notes (Signed)
Tried to call phone for contact in system for patient to let him know about prescriptions, got a recording call could not be completed as dialed

## 2018-10-09 NOTE — Progress Notes (Signed)
Pt alert and oriented. Pt was notified of [place] of Abdomen for today and that he needed to be NPO, pt became very upset and stated that he did not wanted to get the [place] due to not being able to eat for an amount time. He also had refused the morning labs but decided to get them this am. Continue to monitor

## 2018-10-09 NOTE — Progress Notes (Signed)
Reviewed discharge instructions home meds discharg instructions prescriptions and follow up appointment with patient and patient verbalized understanding.

## 2018-10-10 ENCOUNTER — Encounter: Payer: Self-pay | Admitting: Emergency Medicine

## 2018-12-24 ENCOUNTER — Observation Stay
Admission: EM | Admit: 2018-12-24 | Discharge: 2018-12-25 | Disposition: A | Discharge status: Home / Self Care | Payer: Self-pay | Attending: Internal Medicine | Admitting: Internal Medicine

## 2018-12-24 ENCOUNTER — Other Ambulatory Visit: Payer: Self-pay

## 2018-12-24 ENCOUNTER — Emergency Department: Payer: Self-pay

## 2018-12-24 DIAGNOSIS — E440 Moderate protein-calorie malnutrition: Secondary | ICD-10-CM | POA: Insufficient documentation

## 2018-12-24 DIAGNOSIS — N433 Hydrocele, unspecified: Secondary | ICD-10-CM | POA: Insufficient documentation

## 2018-12-24 DIAGNOSIS — N453 Epididymo-orchitis: Principal | ICD-10-CM | POA: Diagnosis present

## 2018-12-24 DIAGNOSIS — K760 Fatty (change of) liver, not elsewhere classified: Secondary | ICD-10-CM | POA: Insufficient documentation

## 2018-12-24 DIAGNOSIS — E871 Hypo-osmolality and hyponatremia: Secondary | ICD-10-CM | POA: Insufficient documentation

## 2018-12-24 DIAGNOSIS — Z79899 Other long term (current) drug therapy: Secondary | ICD-10-CM | POA: Insufficient documentation

## 2018-12-24 DIAGNOSIS — I700 Atherosclerosis of aorta: Secondary | ICD-10-CM | POA: Insufficient documentation

## 2018-12-24 DIAGNOSIS — N1339 Other hydronephrosis: Secondary | ICD-10-CM | POA: Insufficient documentation

## 2018-12-24 DIAGNOSIS — F1021 Alcohol dependence, in remission: Secondary | ICD-10-CM | POA: Insufficient documentation

## 2018-12-24 LAB — COMPREHENSIVE METABOLIC PANEL
ALT: 8 U/L (ref 0–44)
AST: 18 U/L (ref 15–41)
Albumin: 3.9 g/dL (ref 3.5–5.0)
Alkaline Phosphatase: 81 U/L (ref 38–126)
Anion gap: 15 (ref 5–15)
BUN: 8 mg/dL (ref 6–20)
CO2: 25 mmol/L (ref 22–32)
Calcium: 9.2 mg/dL (ref 8.9–10.3)
Chloride: 91 mmol/L — ABNORMAL LOW (ref 98–111)
Creatinine, Ser: 0.62 mg/dL (ref 0.61–1.24)
GFR calc Af Amer: >60 mL/min (ref >60)
GFR calc non Af Amer: >60 mL/min (ref >60)
Glucose, Bld: 96 mg/dL (ref 70–99)
Potassium: 3.8 mmol/L (ref 3.5–5.1)
Sodium: 131 mmol/L — ABNORMAL LOW (ref 135–145)
Total Bilirubin: 1.1 mg/dL (ref 0.3–1.2)
Total Protein: 8.2 g/dL — ABNORMAL HIGH (ref 6.5–8.1)

## 2018-12-24 LAB — CBC WITH DIFFERENTIAL/PLATELET
Abs Immature Granulocytes: 0.07 10*3/uL (ref 0.00–0.07)
Basophils Absolute: 0.1 10*3/uL (ref 0.0–0.1)
Basophils Relative: 0 %
Eosinophils Absolute: 0 10*3/uL (ref 0.0–0.5)
Eosinophils Relative: 0 %
HCT: 37.7 % — ABNORMAL LOW (ref 39.0–52.0)
Hemoglobin: 12.9 g/dL — ABNORMAL LOW (ref 13.0–17.0)
Immature Granulocytes: 0 %
Lymphocytes Relative: 10 %
Lymphs Abs: 1.5 10*3/uL (ref 0.7–4.0)
MCH: 29.8 pg (ref 26.0–34.0)
MCHC: 34.2 g/dL (ref 30.0–36.0)
MCV: 87.1 fL (ref 80.0–100.0)
Monocytes Absolute: 1.4 10*3/uL — ABNORMAL HIGH (ref 0.1–1.0)
Monocytes Relative: 9 %
Neutro Abs: 13 10*3/uL — ABNORMAL HIGH (ref 1.7–7.7)
Neutrophils Relative %: 81 %
Platelets: 253 10*3/uL (ref 150–400)
RBC: 4.33 MIL/uL (ref 4.22–5.81)
RDW: 11.6 % (ref 11.5–15.5)
WBC: 16.2 10*3/uL — ABNORMAL HIGH (ref 4.0–10.5)
nRBC: 0 % (ref 0.0–0.2)

## 2018-12-24 LAB — URINALYSIS, COMPLETE (UACMP) WITH MICROSCOPIC
Bilirubin Urine: NEGATIVE
Glucose, UA: NEGATIVE mg/dL
Ketones, ur: NEGATIVE mg/dL
Nitrite: POSITIVE — AB
Protein, ur: 100 mg/dL — AB
Specific Gravity, Urine: 1.015 (ref 1.005–1.030)
Squamous Epithelial / LPF: NONE SEEN (ref 0–5)
WBC, UA: >50 WBC/hpf — ABNORMAL HIGH (ref 0–5)
pH: 6 (ref 5.0–8.0)

## 2018-12-24 LAB — CHLAMYDIA/NGC RT PCR (ARMC ONLY)
Chlamydia Tr: NOT DETECTED
N gonorrhoeae: NOT DETECTED

## 2018-12-24 MED ORDER — IOHEXOL 240 MG/ML SOLN
50.0000 mL | Freq: Once | INTRAMUSCULAR | Status: AC | PRN
  Administered 2018-12-24: 50 mL via ORAL

## 2018-12-24 MED ORDER — DILTIAZEM HCL ER COATED BEADS 180 MG PO TB24
180.0000 mg | ORAL_TABLET | Freq: Every day | ORAL | Status: DC
  Filled 2018-12-24: qty 1

## 2018-12-24 MED ORDER — ONDANSETRON HCL 4 MG/2ML IJ SOLN: 4.0000 mg | Freq: Four times a day (QID) | INTRAMUSCULAR | Status: DC | PRN

## 2018-12-24 MED ORDER — CEFTRIAXONE SODIUM 1 G IJ SOLR
1.0000 g | Freq: Once | INTRAMUSCULAR | Status: AC
  Administered 2018-12-24: 1 g via INTRAVENOUS
  Filled 2018-12-24: qty 10

## 2018-12-24 MED ORDER — CIPROFLOXACIN IN D5W 400 MG/200ML IV SOLN
400.0000 mg | Freq: Once | INTRAVENOUS | Status: AC
  Administered 2018-12-24: 400 mg via INTRAVENOUS
  Filled 2018-12-24: qty 200

## 2018-12-24 MED ORDER — ONDANSETRON HCL 4 MG PO TABS: 4.0000 mg | ORAL_TABLET | Freq: Four times a day (QID) | ORAL | Status: DC | PRN

## 2018-12-24 MED ORDER — ACETAMINOPHEN 325 MG PO TABS: 650.0000 mg | ORAL_TABLET | Freq: Four times a day (QID) | ORAL | Status: DC | PRN

## 2018-12-24 MED ORDER — CIPROFLOXACIN IN D5W 400 MG/200ML IV SOLN
400.0000 mg | Freq: Two times a day (BID) | INTRAVENOUS | Status: DC
  Administered 2018-12-25: 400 mg via INTRAVENOUS
  Filled 2018-12-24 (×3): qty 200

## 2018-12-24 MED ORDER — VITAMIN B-1 100 MG PO TABS
100.0000 mg | ORAL_TABLET | Freq: Every day | ORAL | Status: DC
  Filled 2018-12-24: qty 1

## 2018-12-24 MED ORDER — ENSURE ENLIVE PO LIQD: 237.0000 mL | Freq: Three times a day (TID) | ORAL | Status: DC

## 2018-12-24 MED ORDER — ACETAMINOPHEN 650 MG RE SUPP: 650.0000 mg | Freq: Four times a day (QID) | RECTAL | Status: DC | PRN

## 2018-12-24 MED ORDER — IOHEXOL 300 MG/ML  SOLN
100.0000 mL | Freq: Once | INTRAMUSCULAR | Status: AC | PRN
  Administered 2018-12-24: 100 mL via INTRAVENOUS

## 2018-12-24 MED ORDER — SODIUM CHLORIDE 0.9 % IV SOLN
1.0000 g | INTRAVENOUS | Status: DC
  Filled 2018-12-24: qty 10

## 2018-12-24 MED ORDER — FOLIC ACID 1 MG PO TABS
1.0000 mg | ORAL_TABLET | Freq: Every day | ORAL | Status: DC
  Filled 2018-12-24: qty 1

## 2018-12-24 MED ORDER — POLYETHYLENE GLYCOL 3350 17 G PO PACK: 17.0000 g | PACK | Freq: Every day | ORAL | Status: DC | PRN

## 2018-12-24 NOTE — H&P (Signed)
[facility] [place] [facility]   PATIENT NAME: Kien Mirsky    MR#:  [id number]  DATE OF BIRTH:  1966/05/20  DATE OF ADMISSION:  12/24/2018  PRIMARY CARE PHYSICIAN: Patient, No Pcp Per   REQUESTING/REFERRING PHYSICIAN: Minna Antis, MD  CHIEF COMPLAINT:   Chief Complaint  Patient presents with   Testicle Pain    HISTORY OF PRESENT ILLNESS:  Jahmil Macleod  is a 53 y.o. male with a known history of right inguinal hernia who presented to the ED with right scrotal pain for the last 3 days.  He states that the pain is "constant".  The pain is worse whenever his scrotum touches his leg.  He states he has had some "white material" coming out of his urethra.  He denies abdominal pain, fevers, chills.  No recent sexual partners.  In the ED, vitals were unremarkable.  Labs were significant for sodium 131, WBC 16.2.  UA with small hemoglobin, positive nitrites, many bacteria, large leukocytes.  Ultrasound scrotum with hyperemia of the right testicle and epididymis with subtle enlargement of the right testicle and epididymal tissues, suggestive of right epididymo-orchitis.  CT abdomen pelvis with possible prostatitis, a sending urinary tract infection, and complex right hydrocele without organized abscess.  Hospitalists were called for admission.  PAST MEDICAL HISTORY:   Past Medical History:  Diagnosis Date   Alcohol dependence in remission (HCC)    is at [facility] and sober for 28 days    ETOH abuse    Inguinal hernia    Right    PAST SURGICAL HISTORY:   Past Surgical History:  Procedure Laterality Date   MANDIBLE FRACTURE SURGERY      SOCIAL HISTORY:   Social History   Tobacco Use   Smoking status: Never Smoker   Smokeless tobacco: Never Used  Substance Use Topics   Alcohol use: Yes    Comment: used to drink five 40 ounce bottles of malt liquor a day; sober May 2017    FAMILY HISTORY:   Family History  Problem Relation Age of Onset    Diabetes type II Father    Diabetes Father    Arthritis Mother     DRUG ALLERGIES:  No Known Allergies  REVIEW OF SYSTEMS:   Review of Systems  Constitutional: Negative for chills and fever.  HENT: Negative for congestion and sore throat.   Eyes: Negative for blurred vision and double vision.  Respiratory: Negative for cough and shortness of breath.   Cardiovascular: Negative for chest pain, palpitations and leg swelling.  Gastrointestinal: Negative for abdominal pain, nausea and vomiting.  Genitourinary: Negative for dysuria, flank pain, frequency and urgency.  Musculoskeletal: Negative for back pain and neck pain.  Neurological: Negative for dizziness and headaches.  Psychiatric/Behavioral: Negative for depression. The patient is not nervous/anxious.     MEDICATIONS AT HOME:   Prior to Admission medications   Medication Sig Start Date End Date Taking? Authorizing Provider  diltiazem (CARDIZEM CD) 180 MG 24 hr capsule Take 1 capsule (180 mg total) by mouth daily. 10/10/18   Alford Highland, MD  feeding supplement, ENSURE ENLIVE, (ENSURE ENLIVE) LIQD Take 237 mLs by mouth 3 (three) times daily between meals. 10/09/18   Alford Highland, MD  folic acid (FOLVITE) 1 MG tablet Take 1 tablet (1 mg total) by mouth daily. 10/10/18   Alford Highland, MD  thiamine 100 MG tablet Take 1 tablet (100 mg total) by mouth daily. 10/14/18   Alford Highland, MD  VITAL SIGNS:  Blood pressure 122/86, pulse 100, temperature 99.4 F (37.4 C), temperature source Oral, resp. rate 17, height 5\' 8"  (1.727 m), weight 62.1 kg, SpO2 97 %.  PHYSICAL EXAMINATION:  Physical Exam  GENERAL:  53 y.o.-year-old patient lying in the bed with no acute distress.  EYES: Pupils equal, round, reactive to light and accommodation. No scleral icterus. Extraocular muscles intact.  HEENT: Head atraumatic, normocephalic. Oropharynx and nasopharynx clear.  NECK:  Supple, no jugular venous distention. No thyroid  enlargement, no tenderness.  LUNGS: Normal breath sounds bilaterally, no wheezing, rales,rhonchi or crepitation. No use of accessory muscles of respiration.  CARDIOVASCULAR: S1, S2 normal. No murmurs, rubs, or gallops.  ABDOMEN: Soft, nontender, nondistended. Bowel sounds present. No organomegaly or mass.  GENITOURINARY: Right scrotum is erythematous.  Right testicle is edematous and tender to palpation. EXTREMITIES: No pedal edema, cyanosis, or clubbing.  NEUROLOGIC: Cranial nerves II through XII are intact. Muscle strength 5/5 in all extremities. Sensation intact. Gait not checked.  PSYCHIATRIC: The patient is alert and oriented x 3.  SKIN: No obvious rash, lesion, or ulcer.   LABORATORY PANEL:   CBC Recent Labs  Lab 12/24/18 1819  WBC 16.2*  HGB 12.9*  HCT 37.7*  PLT 253   ------------------------------------------------------------------------------------------------------------------  Chemistries  Recent Labs  Lab 12/24/18 1819  NA 131*  K 3.8  CL 91*  CO2 25  GLUCOSE 96  BUN 8  CREATININE 0.62  CALCIUM 9.2  AST 18  ALT 8  ALKPHOS 81  BILITOT 1.1   ------------------------------------------------------------------------------------------------------------------  Cardiac Enzymes No results for input(s): TROPONINI in the last 168 hours. ------------------------------------------------------------------------------------------------------------------  RADIOLOGY:  US Scrotum  Result Date: 12/24/2018 CLINICAL DATA:  3 day history of pain and swelling. EXAM: SCROTAL ULTRASOUND DOPPLER ULTRASOUND OF THE TESTICLES TECHNIQUE: Complete ultrasound examination of the testicles, epididymis, and other scrotal structures was performed. Color and spectral Doppler ultrasound were also utilized to evaluate blood flow to the testicles. COMPARISON:  None. FINDINGS: Right testicle Measurements: 4.7 x 2.7 x 3.0 cm. No mass visualized. Increased flow signal noted right testicular  parenchyma on color Doppler evaluation. Left testicle Measurements:  4.8 x 1.6 x 2.6 cm.  No mass visualized. Right epididymis: Appears enlarged with tiny epididymal cysts or spermatoceles evident. Left epididymis:  Normal in size and appearance. Hydrocele: Complex fluid noted right hemiscrotum with internal debris and septations. Varicocele:  None visualized. Pulsed Doppler interrogation of both testes demonstrates normal low resistance arterial and venous waveforms bilaterally. Other: Echogenic tissue with prominent vascular anatomy noted in the upper right hemiscrotum. Sonographer evaluated with and without Valsalva maneuver but could discern no change. IMPRESSION: 1. Hyperemia of the right testicle and epididymis with subtle enlargement of the right testicle and epididymal tissues. These findings are associated with complex fluid in the right hemiscrotum containing internal septations. Imaging features are most suggestive of right epididymo-orchitis. 2. Sonographer identified echogenic, vascular tissue in the upper right hemiscrotum, apparently tracking from the groin region. Although this did not change with Valsalva maneuver, right groin hernia a consideration. If this is not apparent clinically, follow-up CT of the pelvis may prove helpful to further evaluate. Electronically Signed   By: Kennith Center M.D.   On: 12/24/2018 19:04   Ct Abdomen Pelvis W Contrast  Result Date: 12/24/2018 CLINICAL DATA:  Pain and swelling to right scrotum. Fluid collection in the right scrotum as well as possible hernia on ultrasound. EXAM: CT ABDOMEN AND PELVIS WITH CONTRAST TECHNIQUE: Multidetector CT imaging of the abdomen  and pelvis was performed using the standard protocol following bolus administration of intravenous contrast. CONTRAST:  100mL OMNIPAQUE IOHEXOL 300 MG/ML  SOLN COMPARISON:  Scrotal ultrasound earlier this day. FINDINGS: Lower chest: The lung bases are clear. Hepatobiliary: Mild hepatic steatosis. No focal  abnormality. Gallbladder physiologically distended, no calcified stone. No biliary dilatation. Pancreas: No ductal dilatation or inflammation. Spleen: Normal in size without focal abnormality. Adrenals/Urinary Tract: Normal adrenal glands. Right hydroureteronephrosis. No obstructing stone. Enhancement of the right ureter with mild periureteric stranding. Mild prominence of the left renal collecting system without ureteral dilatation. Homogeneous renal enhancement urinary bladder is partially distended, bladder wall thickening at the base with questionable hyperemia. Stomach/Bowel: Stomach physiologically distended. No bowel wall thickening, inflammatory change, or obstruction. Administered enteric contrast reaches the colon. No bowel containing inguinal hernia. Vascular/Lymphatic: Aortic atherosclerosis. No aneurysm. Portal vein and mesenteric vessels are patent. No enlarged lymph nodes in the abdomen or pelvis. Reproductive: Heterogeneous prostate gland with question of periprosthetic haziness. There is hyperemia of the right pampiniform plexus in the inguinal canal extending into the scrotum with scrotal hyperemia is seen on ultrasound. Small complex hydrocele without organized scrotal collection. Other: No evidence of perineal fluid collection. No abdominopelvic fluid collection or abscess. No free fluid or free air. Musculoskeletal: Hemi transitional lumbosacral anatomy. There are no acute or suspicious osseous abnormalities. IMPRESSION: 1. Heterogeneous prostate gland with question of periprostatic haziness, suspicious for prostatitis. 2. Urinary bladder wall thickening likely cystitis. Mild right hydroureteronephrosis with ureteral enhancement and periureteral edema, suspect ascending urinary tract infection. No obstructing stone. 3. Hyperemia of the right pampiniform plexus extending from the inguinal canal into the scrotum increased vascularity to the right scrotum, in keeping with sonographic findings of  right epididymal orchitis. Complex right hydrocele without organized abscess. 4. No inguinal hernia. Aortic Atherosclerosis (ICD10-I70.0). Electronically Signed   By: Narda RutherfordMelanie  Sanford M.D.   On: 12/24/2018 21:23      IMPRESSION AND PLAN:   Right sided epididymo-orchitis- seen on ultrasound scrotum.  Patient not meeting sepsis criteria on admission. -Urology consult -Continue ceftriaxone and ciprofloxacin -Will make patient n.p.o. at midnight in case of possible procedure in the morning, per urology recommendations. -Check urine cytology per urology recommendations  ? Prostatitis/ascending urinary tract infection- seen on CT abdomen/pelvis. Patient denies urinary symptoms. -Continue ceftriaxone and ciprofloxacin as above -Urology following -Check urine culture  Chronic hyponatremia- unclear etiology. Appears euvolemic.  -Check urine sodium -IVFs -Recheck sodium in the morning  Moderate protein-calorie malnutrition -Continue home Ensure  DVT prophylaxis- SCDs for now, hold on pharmacologic DVT prophylaxis for possible procedure tomorrow  All the records are reviewed and case discussed with ED provider. Management plans discussed with the patient, family and they are in agreement.  CODE STATUS: full  TOTAL TIME TAKING CARE OF THIS PATIENT: 45 minutes.    Jinny BlossomKaty D Michaelangelo Mittelman M.D on 12/24/2018 at 9:40 PM  Between 7am to 6pm - Pager - [phone]  After 6pm go to [email or web address] - Social research officer, governmentpassword [email or web address]  [facility] [place] [facility]  Office  [phone]  CC: Primary care physician; Patient, No Pcp Per   Note: This dictation was prepared with Dragon dictation along with smaller phrase technology. Any transcriptional errors that result from this process are unintentional.

## 2018-12-24 NOTE — ED Provider Notes (Signed)
[facility] Emergency Department Provider Note  Time seen: 6:50 PM  I have reviewed the triage vital signs and the nursing notes.   HISTORY  Chief Complaint Testicle Pain   HPI Kristopher Duran is a 53 y.o. male with a past medical history of alcohol use, presents emergency department for right sided testicular pain and swelling.  According to the patient for the past 3 days he has had progressive worsening tenderness and swelling in the right testicle.  No history of the same.  Denies any discharge.  No dysuria but states when he urinates he will feel pressure in his testicle or scrotum.  Denies any fever.  No abdominal pain nausea vomiting or diarrhea.   Past Medical History:  Diagnosis Date  . Alcohol dependence in remission [facility])    is at [facility] and sober for 28 days   . ETOH abuse   . Inguinal hernia    Right    Patient Active Problem List   Diagnosis Date Noted  . Protein-calorie malnutrition, severe 10/06/2018  . Alcohol withdrawal delirium (HCC) 10/04/2018  . Pedal edema 02/14/2016  . Hernia of abdominal cavity 11/09/2015  . Alcohol dependence in remission [facility])     Past Surgical History:  Procedure Laterality Date  . MANDIBLE FRACTURE SURGERY      Prior to Admission medications   Medication Sig Start Date End Date Taking? Authorizing Provider  diltiazem (CARDIZEM CD) 180 MG 24 hr capsule Take 1 capsule (180 mg total) by mouth daily. 10/10/18   Alford Highland, MD  feeding supplement, ENSURE ENLIVE, (ENSURE ENLIVE) LIQD Take 237 mLs by mouth 3 (three) times daily between meals. 10/09/18   Alford Highland, MD  folic acid (FOLVITE) 1 MG tablet Take 1 tablet (1 mg total) by mouth daily. 10/10/18   Alford Highland, MD  thiamine 100 MG tablet Take 1 tablet (100 mg total) by mouth daily. 10/14/18   Alford Highland, MD    No Known Allergies  Family History  Problem Relation Age of Onset  . Diabetes type II Father   . Diabetes Father   .  Arthritis Mother     Social History Social History   Tobacco Use  . Smoking status: Never Smoker  . Smokeless tobacco: Never Used  Substance Use Topics  . Alcohol use: Yes    Comment: used to drink five 40 ounce bottles of malt liquor a day; sober May 2017  . Drug use: Not Currently    Types: Marijuana, Cocaine, "Crack" cocaine    Comment: none since 2011    Review of Systems Constitutional: Negative for fever. Cardiovascular: Negative for chest pain. Respiratory: Negative for shortness of breath. Gastrointestinal: Negative for abdominal pain, vomiting and diarrhea. Genitourinary: Right-sided scrotal/testicular pain and swelling x3 days. Musculoskeletal: Negative for musculoskeletal complaints Neurological: Negative for headache All other ROS negative  ____________________________________________   PHYSICAL EXAM:  VITAL SIGNS: ED Triage Vitals  Enc Vitals Group     BP 12/24/18 1719 130/79     Pulse Rate 12/24/18 1719 (!) 127     Resp 12/24/18 1719 16     Temp 12/24/18 1719 99.4 F (37.4 C)     Temp Source 12/24/18 1719 Oral     SpO2 12/24/18 1719 96 %     Weight 12/24/18 1719 137 lb (62.1 kg)     Height 12/24/18 1719 5\' 8"  (1.727 m)     Head Circumference --      Peak Flow --  Pain Score 12/24/18 1721 6     Pain Loc --      Pain Edu? --      Excl. in GC? --    Constitutional: Alert and oriented. Well appearing and in no distress. Eyes: Normal exam ENT   Head: Normocephalic and atraumatic.   Mouth/Throat: Mucous membranes are moist. Cardiovascular: Normal rate, regular rhythm.  Respiratory: Normal respiratory effort without tachypnea nor retractions. Breath sounds are clear  Gastrointestinal: Soft and nontender. No distention.   Genitourinary: Patient has significant swelling of the right side of the scrotum, it appears indurated very tender to palpation and erythematous.  No drainage. Musculoskeletal: Nontender with normal range of motion in all  extremities.  Neurologic:  Normal speech and language. No gross focal neurologic deficits  Skin:  Skin is warm, dry and intact.  Psychiatric: Mood and affect are normal.  ____________________________________________   RADIOLOGY  IMPRESSION: 1. Hyperemia of the right testicle and epididymis with subtle enlargement of the right testicle and epididymal tissues. These findings are associated with complex fluid in the right hemiscrotum containing internal septations. Imaging features are most suggestive of right epididymo-orchitis. 2. Sonographer identified echogenic, vascular tissue in the upper right hemiscrotum, apparently tracking from the groin region. Although this did not change with Valsalva maneuver, right groin hernia a consideration. If this is not apparent clinically, follow-up CT of the pelvis may prove helpful to further evaluate.  ____________________________________________   INITIAL IMPRESSION / ASSESSMENT AND PLAN / ED COURSE  Pertinent labs & imaging results that were available during my care of the patient were reviewed by me and considered in my medical decision making (see chart for details).  Patient presents for 3 days of worsening swelling and discomfort in the right testicle.  No apparent or large hernia present, appears to be more consistent with inflammatory process, differential at this time would include torsion, significant epididymitis, localized infection, abscess or Fournier's gangrene.  Patient has a temperature 99.4 in the emergency department.  We will check labs, obtain a scrotal ultrasound and continue to closely monitor.  Ultrasound is consistent with epididymoorchitis, we will proceed with CT imaging to help rule out an abscess or hernia in addition.  I discussed the patient with with urology, Dr. Gabrielle DareSninski.  We will admit to the hospital service continue with IV antibiotics and urology will consult in the  morning. ____________________________________________   FINAL CLINICAL IMPRESSION(S) / ED DIAGNOSES  Scrotal swelling Epididymoorchitis   Minna AntisPaduchowski, Yan Pankratz, MD 12/24/18 2045

## 2018-12-24 NOTE — ED Triage Notes (Signed)
Right sided testicular swelling X 3 days. No injury. No urinary sx. No hx of similar.

## 2018-12-24 NOTE — ED Notes (Signed)
Patient denies pain and is resting comfortably.  

## 2018-12-24 NOTE — ED Notes (Signed)
ED TO INPATIENT HANDOFF REPORT  ED Nurse Name and Phone #: [phone]   S Name/Age/Gender Kristopher Duran 53 y.o. male Room/Bed: ED09A/ED09A  Code Status   Code Status: Prior  Home/SNF/Other Home  A/Ox4 Is this baseline? YES  Triage Complete: Triage complete  Chief Complaint Swollen testicle  Triage Note Right sided testicular swelling X 3 days. No injury. No urinary sx. No hx of similar.     Allergies No Known Allergies  Level of Care/Admitting Diagnosis ED Disposition    ED Disposition Condition Comment   Admit  Hospital Area: [facility] [[id number]]  Level of Care: Med-Surg [16]  Diagnosis: Epididymoorchitis [[id number]][[id number]]  Admitting Physician: Willadean CarolMAYO, KATY DODD [[id number]]  Attending Physician: Willadean CarolMAYO, KATY DODD [[id number]]  PT Class (Do Not Modify): Observation [104]  PT Acc Code (Do Not Modify): Observation [10022]       B Medical/Surgery History Past Medical History:  Diagnosis Date  . Alcohol dependence in remission [facility](HCC)    [facility] and sober for 28 days   . ETOH abuse   . Inguinal hernia    Right   Past Surgical History:  Procedure Laterality Date  . MANDIBLE FRACTURE SURGERY       A IV Location/Drains/Wounds Patient Lines/Drains/Airways Status   Active Line/Drains/Airways    Name:   Placement date:   Placement time:   Site:   Days:   Peripheral IV 12/24/18 Left Antecubital   12/24/18    1825    Antecubital   less than 1          Intake/Output Last 24 hours  Intake/Output Summary (Last 24 hours) at 12/24/2018 2224 Last data filed at 12/24/2018 2221 Gross per 24 hour  Intake 100 ml  Output 950 ml  Net -850 ml    Labs/Imaging Results for orders placed or performed during the hospital encounter of 12/24/18 (from the past 48 hour(s))  CBC with Differential     Status: Abnormal   Collection Time: 12/24/18  6:19 PM  Result Value Ref Range   WBC 16.2 (H) 4.0 - 10.5 K/uL   RBC 4.33 4.22 - 5.81 MIL/uL   Hemoglobin 12.9 (L)  13.0 - 17.0 g/dL   HCT 28.437.7 (L) 13.239.0 - 44.052.0 %   MCV 87.1 80.0 - 100.0 fL   MCH 29.8 26.0 - 34.0 pg   MCHC 34.2 30.0 - 36.0 g/dL   RDW 10.211.6 72.511.5 - 36.615.5 %   Platelets 253 150 - 400 K/uL   nRBC 0.0 0.0 - 0.2 %   Neutrophils Relative % 81 %   Neutro Abs 13.0 (H) 1.7 - 7.7 K/uL   Lymphocytes Relative 10 %   Lymphs Abs 1.5 0.7 - 4.0 K/uL   Monocytes Relative 9 %   Monocytes Absolute 1.4 (H) 0.1 - 1.0 K/uL   Eosinophils Relative 0 %   Eosinophils Absolute 0.0 0.0 - 0.5 K/uL   Basophils Relative 0 %   Basophils Absolute 0.1 0.0 - 0.1 K/uL   Immature Granulocytes 0 %   Abs Immature Granulocytes 0.07 0.00 - 0.07 K/uL    Comment: Performed at [facility], [address] [place], [place] [address]  Comprehensive metabolic panel     Status: Abnormal   Collection Time: 12/24/18  6:19 PM  Result Value Ref Range   Sodium 131 (L) 135 - 145 mmol/L   Potassium 3.8 3.5 - 5.1 mmol/L   Chloride 91 (L) 98 - 111 mmol/L   CO2 25 22 -  32 mmol/L   Glucose, Bld 96 70 - 99 mg/dL   BUN 8 6 - 20 mg/dL   Creatinine, Ser 7.67 0.61 - 1.24 mg/dL   Calcium 9.2 8.9 - 20.9 mg/dL   Total Protein 8.2 (H) 6.5 - 8.1 g/dL   Albumin 3.9 3.5 - 5.0 g/dL   AST 18 15 - 41 U/L   ALT 8 0 - 44 U/L   Alkaline Phosphatase 81 38 - 126 U/L   Total Bilirubin 1.1 0.3 - 1.2 mg/dL   GFR calc non Af Amer >60 >60 mL/min   GFR calc Af Amer >60 >60 mL/min   Anion gap 15 5 - 15    Comment: Performed at [facility], [address] [place], [place] [address]  Urinalysis, Complete w Microscopic     Status: Abnormal   Collection Time: 12/24/18  6:19 PM  Result Value Ref Range   Color, Urine AMBER (A) YELLOW    Comment: BIOCHEMICALS MAY BE AFFECTED BY COLOR   APPearance CLOUDY (A) CLEAR   Specific Gravity, Urine 1.015 1.005 - 1.030   pH 6.0 5.0 - 8.0   Glucose, UA NEGATIVE NEGATIVE mg/dL   Hgb urine dipstick SMALL (A) NEGATIVE   Bilirubin Urine NEGATIVE NEGATIVE   Ketones, ur NEGATIVE NEGATIVE mg/dL    Protein, ur 283 (A) NEGATIVE mg/dL   Nitrite POSITIVE (A) NEGATIVE   Leukocytes,Ua LARGE (A) NEGATIVE   RBC / HPF 6-10 0 - 5 RBC/hpf   WBC, UA >50 (H) 0 - 5 WBC/hpf   Bacteria, UA MANY (A) NONE SEEN   Squamous Epithelial / LPF NONE SEEN 0 - 5   WBC Clumps PRESENT    Mucus PRESENT    Hyaline Casts, UA PRESENT     Comment: Performed at [facility], [address] [place], [place] [address]  Chlamydia/NGC rt PCR [facility] only)     Status: None   Collection Time: 12/24/18  6:19 PM  Result Value Ref Range   Specimen source GC/Chlam URINE, RANDOM    Chlamydia Tr NOT DETECTED NOT DETECTED   N gonorrhoeae NOT DETECTED NOT DETECTED    Comment: (NOTE) This CT/NG assay has not been evaluated in patients with a history of  hysterectomy. Performed at [facility], [address] [place], [place] [address]    US Scrotum  Result Date: 12/24/2018 CLINICAL DATA:  3 day history of pain and swelling. EXAM: SCROTAL ULTRASOUND DOPPLER ULTRASOUND OF THE TESTICLES TECHNIQUE: Complete ultrasound examination of the testicles, epididymis, and other scrotal structures was performed. Color and spectral Doppler ultrasound were also utilized to evaluate blood flow to the testicles. COMPARISON:  None. FINDINGS: Right testicle Measurements: 4.7 x 2.7 x 3.0 cm. No mass visualized. Increased flow signal noted right testicular parenchyma on color Doppler evaluation. Left testicle Measurements:  4.8 x 1.6 x 2.6 cm.  No mass visualized. Right epididymis: Appears enlarged with tiny epididymal cysts or spermatoceles evident. Left epididymis:  Normal in size and appearance. Hydrocele: Complex fluid noted right hemiscrotum with internal debris and septations. Varicocele:  None visualized. Pulsed Doppler interrogation of both testes demonstrates normal low resistance arterial and venous waveforms bilaterally. Other: Echogenic tissue with prominent vascular anatomy noted in the upper right hemiscrotum. Sonographer  evaluated with and without Valsalva maneuver but could discern no change. IMPRESSION: 1. Hyperemia of the right testicle and epididymis with subtle enlargement of the right testicle and epididymal tissues. These findings are associated with complex fluid in the right hemiscrotum containing  internal septations. Imaging features are most suggestive of right epididymo-orchitis. 2. Sonographer identified echogenic, vascular tissue in the upper right hemiscrotum, apparently tracking from the groin region. Although this did not change with Valsalva maneuver, right groin hernia a consideration. If this is not apparent clinically, follow-up CT of the pelvis may prove helpful to further evaluate. Electronically Signed   By: Kennith Center M.D.   On: 12/24/2018 19:04   Ct Abdomen Pelvis W Contrast  Result Date: 12/24/2018 CLINICAL DATA:  Pain and swelling to right scrotum. Fluid collection in the right scrotum as well as possible hernia on ultrasound. EXAM: CT ABDOMEN AND PELVIS WITH CONTRAST TECHNIQUE: Multidetector CT imaging of the abdomen and pelvis was performed using the standard protocol following bolus administration of intravenous contrast. CONTRAST:  OMNIPAQUE IOHEXOL 300 MG/ML  SOLN COMPARISON:  Scrotal ultrasound earlier this day. FINDINGS: Lower chest: The lung bases are clear. Hepatobiliary: Mild hepatic steatosis. No focal abnormality. Gallbladder physiologically distended, no calcified stone. No biliary dilatation. Pancreas: No ductal dilatation or inflammation. Spleen: Normal in size without focal abnormality. Adrenals/Urinary Tract: Normal adrenal glands. Right hydroureteronephrosis. No obstructing stone. Enhancement of the right ureter with mild periureteric stranding. Mild prominence of the left renal collecting system without ureteral dilatation. Homogeneous renal enhancement urinary bladder is partially distended, bladder wall thickening at the base with questionable hyperemia. Stomach/Bowel:  Stomach physiologically distended. No bowel wall thickening, inflammatory change, or obstruction. Administered enteric contrast reaches the colon. No bowel containing inguinal hernia. Vascular/Lymphatic: Aortic atherosclerosis. No aneurysm. Portal vein and mesenteric vessels are patent. No enlarged lymph nodes in the abdomen or pelvis. Reproductive: Heterogeneous prostate gland with question of periprosthetic haziness. There is hyperemia of the right pampiniform plexus in the inguinal canal extending into the scrotum with scrotal hyperemia is seen on ultrasound. Small complex hydrocele without organized scrotal collection. Other: No evidence of perineal fluid collection. No abdominopelvic fluid collection or abscess. No free fluid or free air. Musculoskeletal: Hemi transitional lumbosacral anatomy. There are no acute or suspicious osseous abnormalities. IMPRESSION: 1. Heterogeneous prostate gland with question of periprostatic haziness, suspicious for prostatitis. 2. Urinary bladder wall thickening likely cystitis. Mild right hydroureteronephrosis with ureteral enhancement and periureteral edema, suspect ascending urinary tract infection. No obstructing stone. 3. Hyperemia of the right pampiniform plexus extending from the inguinal canal into the scrotum increased vascularity to the right scrotum, in keeping with sonographic findings of right epididymal orchitis. Complex right hydrocele without organized abscess. 4. No inguinal hernia. Aortic Atherosclerosis (ICD10-I70.0). Electronically Signed   By: Narda Rutherford M.D.   On: 12/24/2018 21:23    Pending Labs [employer] (From admission, onward)    Start     Ordered   Signed and Armed forces training and education officer morning,   R     Signed and Held   Signed and Held  CBC  Tomorrow morning,   R     Signed and Held   Signed and Held  Urine Culture  Add-on,   R     Signed and Held   Signed and Held  Sodium, urine, random  Add-on,   R     Signed and  Held          Vitals/Pain Today's Vitals   12/24/18 1930 12/24/18 2028 12/24/18 2030 12/24/18 2200  BP: 129/83  122/86 123/84  Pulse: 98  100 (!) 103  Resp:   17   Temp:      TempSrc:      SpO2:  100%  97% 97%  Weight:      Height:      PainSc:  2       Isolation Precautions No active isolations  Medications Medications  iohexol (OMNIPAQUE) 240 MG/ML injection 50 mL (50 mLs Oral Contrast Given 12/24/18 1922)  ciprofloxacin (CIPRO) IVPB 400 mg (0 mg Intravenous Stopped 12/24/18 2201)  cefTRIAXone (ROCEPHIN) 1 g in sodium chloride 0.9 % 100 mL IVPB (0 g Intravenous Stopped 12/24/18 2033)  iohexol (OMNIPAQUE) 300 MG/ML solution 100 mL (100 mLs Intravenous Contrast Given 12/24/18 2052)    Mobility walks Low fall risk   Focused Assessments    R Recommendations: See Admitting Provider Note  Report given to:   Additional Notes: Pt is A/Ox4. Pt is able to ambulate to toilet with a steady gait.

## 2018-12-25 DIAGNOSIS — N453 Epididymo-orchitis: Secondary | ICD-10-CM

## 2018-12-25 LAB — BASIC METABOLIC PANEL
Anion gap: 13 (ref 5–15)
BUN: 8 mg/dL (ref 6–20)
CO2: 26 mmol/L (ref 22–32)
Calcium: 8.9 mg/dL (ref 8.9–10.3)
Chloride: 95 mmol/L — ABNORMAL LOW (ref 98–111)
Creatinine, Ser: 0.6 mg/dL — ABNORMAL LOW (ref 0.61–1.24)
GFR calc Af Amer: >60 mL/min (ref >60)
GFR calc non Af Amer: >60 mL/min (ref >60)
Glucose, Bld: 96 mg/dL (ref 70–99)
Potassium: 3.6 mmol/L (ref 3.5–5.1)
Sodium: 134 mmol/L — ABNORMAL LOW (ref 135–145)

## 2018-12-25 LAB — CBC
HCT: 37.6 % — ABNORMAL LOW (ref 39.0–52.0)
Hemoglobin: 12.8 g/dL — ABNORMAL LOW (ref 13.0–17.0)
MCH: 29.8 pg (ref 26.0–34.0)
MCHC: 34 g/dL (ref 30.0–36.0)
MCV: 87.6 fL (ref 80.0–100.0)
Platelets: 241 10*3/uL (ref 150–400)
RBC: 4.29 MIL/uL (ref 4.22–5.81)
RDW: 11.7 % (ref 11.5–15.5)
WBC: 13.6 10*3/uL — ABNORMAL HIGH (ref 4.0–10.5)
nRBC: 0 % (ref 0.0–0.2)

## 2018-12-25 MED ORDER — CIPROFLOXACIN HCL 500 MG PO TABS: 500.0000 mg | ORAL_TABLET | Freq: Two times a day (BID) | ORAL | 0 refills | Status: AC

## 2018-12-25 MED ORDER — ENSURE ENLIVE PO LIQD: 237.0000 mL | Freq: Three times a day (TID) | ORAL | 0 refills | Status: DC

## 2018-12-25 MED ORDER — CIPROFLOXACIN HCL 500 MG PO TABS: 500.0000 mg | ORAL_TABLET | Freq: Two times a day (BID) | ORAL | 0 refills | Status: DC

## 2018-12-25 MED ORDER — IBUPROFEN 400 MG PO TABS: 200.0000 mg | ORAL_TABLET | Freq: Four times a day (QID) | ORAL | 0 refills | Status: DC | PRN

## 2018-12-25 MED ORDER — FOLIC ACID 1 MG PO TABS: 1.0000 mg | ORAL_TABLET | Freq: Every day | ORAL | 0 refills | Status: DC

## 2018-12-25 MED ORDER — DILTIAZEM HCL ER COATED BEADS 180 MG PO CP24: 180.0000 mg | ORAL_CAPSULE | Freq: Every day | ORAL | 0 refills | Status: DC

## 2018-12-25 MED ORDER — THIAMINE HCL 100 MG PO TABS: 100.0000 mg | ORAL_TABLET | Freq: Every day | ORAL | 0 refills | Status: DC

## 2018-12-25 NOTE — Consult Note (Addendum)
12/25/2018 10:51 AM   Kristopher Duran 1965-12-18 [id number]  CC: Right epididymo-orchitis  HPI: I was asked to see Kristopher Duran in consultation for right epididymoorchitis from Dr. Nancy MarusMayo.  Briefly, he is a 53 year old male with past medical history notable for alcoholism who presented to the ER overnight with right-sided testicular pain and swelling.  Urinalysis was concerning for infection and ultrasound suggested right epididymo-orchitis.  A CT scan was performed to rule out possible incarcerated hernia and was notable for heterogenous prostate with haziness suspicious for prostatitis, bladder wall thickening suspicious for cystitis with mild right hydroureteronephrosis concerning for a sending urinary tract infection with no obstructing stone, and findings consistent with right epididymo-orchitis.  The patient denies any fevers or chills.  He denies any history of urinary symptoms including weak stream, difficulty voiding, or feeling of incomplete emptying.  He denies any flank pain.  He denies any history of gross hematuria.  There is no family history of prostate cancer.  He has a 20-pack-year smoking history, and reports he quit smoking 15 years ago.  He denies a history of recurrent UTIs.  STD testing for G/C testing negative in ED. HIV negative 09/2018.  This morning, he reports he feels significantly improved after receiving Cipro overnight.   PMH: Past Medical History:  Diagnosis Date  . Alcohol dependence in remission [facility](HCC)    [facility] and [facility] for [facility] days   . ETOH abuse   . Inguinal hernia    Right    Surgical History: Past Surgical History:  Procedure Laterality Date  . MANDIBLE FRACTURE SURGERY      Allergies: No Known Allergies  Family History: Family History  Problem Relation Age of Onset  . Diabetes type II Father   . Diabetes Father   . Arthritis Mother     Social History:  reports that he has never smoked. He has never used smokeless tobacco. He reports  current alcohol use. He reports previous drug use. Drugs: Marijuana, Cocaine, and "Crack" cocaine.  ROS: Negative aside from those stated in the HPI.  Physical Exam: BP 97/68 (BP Location: Left Arm)   Pulse 99   Temp 98.9 F (37.2 C) (Oral)   Resp 18   Ht 5\' 8"  (1.727 m)   Wt 62.1 kg   SpO2 97%   BMI 20.83 kg/m    Constitutional:  Alert and oriented, No acute distress.  Nontoxic-appearing Cardiovascular: No clubbing, cyanosis, or edema. Respiratory: Normal respiratory effort, no increased work of breathing. GI: Abdomen is soft, nontender, nondistended, no abdominal masses GU: No CVA tenderness, phallus without lesions, widely patent meatus.  Significant right scrotal edema and tenderness consistent with right epididymoorchitis.  No skin changes, no suspicion of induration or abscess. DRE: Deferred in setting of infection Lymph: No cervical or inguinal lymphadenopathy. Skin: No rashes, bruises or suspicious lesions. Neurologic: Grossly intact, no focal deficits, moving all 4 extremities. Psychiatric: Normal mood and affect.  Laboratory Data: Reviewed WBC down-trending 13.6(16.2)  Urinalysis many bacteria, greater than 50 WBCs, 6-10 RBCs, nitrite positive, culture pending  Pertinent Imaging: I have personally reviewed the scrotal ultrasound and CT abdomen pelvis.  See HPI for details.  Assessment & Plan:   In summary, the patient is a 53 year old male with past medical history notable for alcoholism who presents with right epididymoorchitis.  He is afebrile, and is significantly improved with 12 hours of antibiotics overnight.  He denies any history of urinary symptoms or flank pain.  Incidentally found on CT, he  had mild hydroureteronephrosis down to a distended bladder with haziness around the prostate consistent with UTI versus prostatitis versus ascending infection.  We discussed possible etiologies at length including prostate cancer, bladder cancer, or more likely, severe  UTI with epididymoorchitis/prostatitis.  Recommendations: -Continue Cipro, follow-up urine culture, recommend 14-day course of antibiotics -Snug fitting underwear, icing as needed, NSAIDs for pain control -Send urine for cytology -Follow-up in urology clinic in 4 to 6 weeks with repeat renal ultrasound to confirm resolution of mild hydroureteronephrosis, PVR, and PSA   Sondra Come, MD  [facility] [address], [address] [place], [place] [address] [phone]

## 2018-12-25 NOTE — Progress Notes (Signed)
Family Meeting Note  Advance Directive:yes  Today a meeting took place with the Patient.  Patient is able to participate.  The following clinical team members were present during this meeting:MD  The following were discussed:Patient's diagnosis: epididymo-orchitis, Patient's progosis: Unable to determine and Goals for treatment: Full Code  Additional follow-up to be provided: prn  Time spent during discussion:20 minutes  Hilton Sinclair, MD

## 2018-12-25 NOTE — Discharge Instructions (Signed)
Orchitis    Orchitis is inflammation of a testicle. Testicles are the male organs that produce sperm. The testicles are held in a fleshy sac (scrotum) located behind the penis. Orchitis usually affects only one testicle, but it can affect both.  Orchitis is caused by infection. Many kinds of bacteria and viruses can cause this infection. The condition can develop suddenly.  What are the causes?  This condition may be caused by:  · Infection from viruses or bacteria.  · Other organisms, such as fungi or parasites (rare). This is common in men who have a weak body defense system (immune system), such as men with HIV.  Bacteria   · Bacterial orchitis often occurs along with an infection of the tube that collects and stores sperm (epididymis).  · In men who are not sexually active, this infection usually starts as a urinary tract infection and spreads to the testicle.  · In sexually active men, sexually transmitted infections (STIs) are the most common cause of bacterial orchitis. These can include:  ? Gonorrhea.  ? Chlamydia.  Viruses  · Mumps is the most common cause of viral orchitis, though mumps is now rare in many areas because of vaccination.  · Other viruses that can cause orchitis include:  ? The chickenpox virus (varicella-zoster virus).  ? The virus that causes mononucleosis (Epstein-Barr virus).  What increases the risk?  The following factors may make you more likely to develop this condition:  · For viral orchitis:  ? Not having been vaccinated against mumps.  · For bacterial orchitis:  ? Having had frequent urinary tract infections.  ? Engaging in high-risk sexual behaviors, such as having multiple sexual partners or having sex without using a condom.  ? Having a sexual partner with an STI.  ? Having had urinary tract surgery.  ? Using a tube that is passed through the penis to drain urine (Foley catheter).  ? Having an enlarged prostate gland.  What are the signs or symptoms?  The most common symptoms of  orchitis are swelling and pain in the scrotum. Other signs and symptoms may include:  · Feeling generally sick (malaise).  · Fever and chills.  · Painful urination.  · Painful ejaculation.  · Headache.  · Fatigue.  · Nausea.  · Blood or discharge from the penis.  · Swollen lymph nodes in the groin area (inguinal nodes).  How is this diagnosed?  This condition may be diagnosed based on:  · Your symptoms. Your health care provider may suspect orchitis if you have a painful, swollen testicle along with other signs and symptoms of the condition.  · A physical exam.  You may also have other tests, including:  · A blood test to check for signs of infection.  · A urine test to check for a urinary tract infection or STI.  · Using a swab to collect a fluid sample from the tip of the penis to test for STIs.  · Taking an image of the testicle using sound waves and a computer (testicular ultrasound).  How is this treated?  Treatment for this condition depends on the cause.   For bacterial orchitis, your health care provider may prescribe antibiotic medicines. Bacterial infections usually clear up within a few days.  For both viral infections and bacterial infections, you may be treated with:  · Rest.  · Anti-inflammatory medicines.  · Pain medicines.  · Raising (elevating) the scrotum with a towel or pillow underneath and applying ice.    Follow these instructions at home:  · Rest as directed by your health care provider.  · Take over-the-counter and prescription medicines only as told by your health care provider.  · If you were prescribed an antibiotic medicine, take it as told by your health care provider. Do not stop taking the antibiotic even if you start to feel better.  · Do not have sex until your health care provider says it is okay to do so.  · Elevate your scrotum and apply ice as directed:  ? Put ice in a plastic bag.  ? Place a small towel or pillow between your legs.  ? Rest your scrotum on the pillow or  towel.  ? Place another towel between your skin and the plastic bag.  ? Leave the ice on for 20 minutes, 2-3 times a day.  · Keep all follow-up visits as told by your health care provider. This is important.  Contact a health care provider if:  · You have a fever.  · Pain and swelling have not gotten better after 3 days.  Get help right away if:  · Your pain is getting worse.  · The swelling in your testicle gets worse.  Summary  · Orchitis is inflammation of a testicle. It is caused by an infection from bacteria or a virus.  · The most common symptoms of orchitis are swelling and pain in the scrotum.  · Treatment for this condition depends on the cause. It may include medicines to fight the infection, reduce inflammation, and relieve the pain.  · Follow your health care provider's instructions about resting, icing, not having sex, and taking medicines.  This information is not intended to replace advice given to you by your health care provider. Make sure you discuss any questions you have with your health care provider.  Document Released: 08/31/2000 Document Revised: 09/20/2017 Document Reviewed: 09/20/2017  Elsevier Interactive Patient Education © 2019 [employer]

## 2018-12-25 NOTE — Care Management (Signed)
Patient to discharge with for Cipro.  Rx was electronically sent to [facility]. Patient provided with goodrx coupon $22.08.  Initially patient inquired about using Medication Management  And getting the medication at no cost.  RNCM informed the patient that we would be able to do that and the medication would be delivered to him in the room this afternoon (meds to bed).  Patient states that he rather go ahead and leave "my momma will pay for it".  Patient provided with application to Medication Management  And [facility]

## 2018-12-25 NOTE — Discharge Summary (Signed)
[facility] - [place] at [facility]   PATIENT NAME: Kristopher Duran    MR#:  [id number]  DATE OF BIRTH:  1966/02/22  DATE OF ADMISSION:  12/24/2018 ADMITTING PHYSICIAN: Campbell StallKaty Dodd Mayo, MD  DATE OF DISCHARGE: 12/25/2018  PRIMARY CARE PHYSICIAN: Patient, No Pcp Per   ADMISSION DIAGNOSIS:  Epididymoorchitis [N45.3]  DISCHARGE DIAGNOSIS:  Active Problems:   Epididymoorchitis Chronic hyponatremia Moderate protein calorie malnutrition  SECONDARY DIAGNOSIS:   Past Medical History:  Diagnosis Date  . Alcohol dependence in remission [facility](HCC)    [facility] and [facility] for [facility] days   . ETOH abuse   . Inguinal hernia    Right     ADMITTING HISTORY Kristopher Portaracy Sones  is a 53 y.o. male with a known history of right inguinal hernia who presented to the ED with right scrotal pain for the last 3 days.  He states that the pain is "constant".  The pain is worse whenever his scrotum touches his leg.  He states he has had some "white material" coming out of his urethra.  He denies abdominal pain, fevers, chills.  No recent sexual partners.In the ED, vitals were unremarkable.  Labs were significant for sodium 131, WBC 16.2.  UA with small hemoglobin, positive nitrites, many bacteria, large leukocytes.  Ultrasound scrotum with hyperemia of the right testicle and epididymis with subtle enlargement of the right testicle and epididymal tissues, suggestive of right epididymo-orchitis.  CT abdomen pelvis with possible prostatitis, a sending urinary tract infection, and complex right hydrocele without organized abscess.  Hospitalists were called for admission.   HOSPITAL COURSE:  Patient was admitted to medical floor.  Patient was started on IV Rocephin and ciprofloxacin antibiotic.  Urology consultation was done.  Urine cytology was also sent.  Patient was worked up with CT abdomen and pelvis.  No evidence of abscess noted on the CT abdomen. urology evaluated the patient and recommended patient can go  home with oral ciprofloxacin antibiotic for 14 days.  Follow-up in the clinic.  Leukocytosis improved with IV antibiotics.  Hyponatremia also improved by the time of discharge.  Patient hemodynamically stable will be discharged home.  CONSULTS OBTAINED:  Treatment Team:  Sondra ComeSninsky, Brian C, MD  DRUG ALLERGIES:  No Known Allergies  DISCHARGE MEDICATIONS:   Allergies as of 12/25/2018   No Known Allergies     Medication List    TAKE these medications   ciprofloxacin 500 MG tablet Commonly known as:  Cipro Take 1 tablet (500 mg total) by mouth 2 (two) times daily for 14 days.   diltiazem 180 MG 24 hr capsule Commonly known as:  CARDIZEM CD Take 1 capsule (180 mg total) by mouth daily.   feeding supplement (ENSURE ENLIVE) Liqd Take 237 mLs by mouth 3 (three) times daily between meals.   folic acid 1 MG tablet Commonly known as:  FOLVITE Take 1 tablet (1 mg total) by mouth daily.   ibuprofen 400 MG tablet Commonly known as:  Motrin IB Take 0.5 tablets (200 mg total) by mouth every 6 (six) hours as needed.   thiamine 100 MG tablet Take 1 tablet (100 mg total) by mouth daily.       Today  Patient seen today No fever Decreased testicular pain Tolerating diet well Hemodynamically stable  VITAL SIGNS:  Blood pressure 97/68, pulse 99, temperature 98.9 F (37.2 C), temperature source Oral, resp. rate 18, height 5\' 8"  (1.727 m), weight 62.1 kg, SpO2 97 %.  I/O:  Intake/Output Summary (Last 24 hours) at 12/25/2018 1059 Last data filed at 12/25/2018 0510 Gross per 24 hour  Intake 100 ml  Output 1350 ml  Net -1250 ml    PHYSICAL EXAMINATION:  Physical Exam  GENERAL:  53 y.o.-year-old patient lying in the bed with no acute distress.  LUNGS: Normal breath sounds bilaterally, no wheezing, rales,rhonchi or crepitation. No use of accessory muscles of respiration.  CARDIOVASCULAR: S1, S2 normal. No murmurs, rubs, or gallops.  ABDOMEN: Soft, non-tender, non-distended. Bowel  sounds present. No organomegaly or mass.  Genitourinary ; right testicular pain NEUROLOGIC: Moves all 4 extremities. PSYCHIATRIC: The patient is alert and oriented x 3.  SKIN: No obvious rash, lesion, or ulcer.   DATA REVIEW:   CBC Recent Labs  Lab 12/25/18 0413  WBC 13.6*  HGB 12.8*  HCT 37.6*  PLT 241    Chemistries  Recent Labs  Lab 12/24/18 1819 12/25/18 0413  NA 131* 134*  K 3.8 3.6  CL 91* 95*  CO2 25 26  GLUCOSE 96 96  BUN 8 8  CREATININE 0.62 0.60*  CALCIUM 9.2 8.9  AST 18  --   ALT 8  --   ALKPHOS 81  --   BILITOT 1.1  --     Cardiac Enzymes No results for input(s): TROPONINI in the last 168 hours.  Microbiology Results  Results for orders placed or performed during the hospital encounter of 12/24/18  Chlamydia/NGC rt PCR ([facility] only)     Status: None   Collection Time: 12/24/18  6:19 PM  Result Value Ref Range Status   Specimen source GC/Chlam URINE, RANDOM  Final   Chlamydia Tr NOT DETECTED NOT DETECTED Final   N gonorrhoeae NOT DETECTED NOT DETECTED Final    Comment: (NOTE) This CT/NG assay has not been evaluated in patients with a history of  hysterectomy. Performed at [facility], [address] [place], [place] [address]     RADIOLOGY:  US Scrotum  Result Date: 12/24/2018 CLINICAL DATA:  3 day history of pain and swelling. EXAM: SCROTAL ULTRASOUND DOPPLER ULTRASOUND OF THE TESTICLES TECHNIQUE: Complete ultrasound examination of the testicles, epididymis, and other scrotal structures was performed. Color and spectral Doppler ultrasound were also utilized to evaluate blood flow to the testicles. COMPARISON:  None. FINDINGS: Right testicle Measurements: 4.7 x 2.7 x 3.0 cm. No mass visualized. Increased flow signal noted right testicular parenchyma on color Doppler evaluation. Left testicle Measurements:  4.8 x 1.6 x 2.6 cm.  No mass visualized. Right epididymis: Appears enlarged with tiny epididymal cysts or spermatoceles evident. Left  epididymis:  Normal in size and appearance. Hydrocele: Complex fluid noted right hemiscrotum with internal debris and septations. Varicocele:  None visualized. Pulsed Doppler interrogation of both testes demonstrates normal low resistance arterial and venous waveforms bilaterally. Other: Echogenic tissue with prominent vascular anatomy noted in the upper right hemiscrotum. Sonographer evaluated with and without Valsalva maneuver but could discern no change. IMPRESSION: 1. Hyperemia of the right testicle and epididymis with subtle enlargement of the right testicle and epididymal tissues. These findings are associated with complex fluid in the right hemiscrotum containing internal septations. Imaging features are most suggestive of right epididymo-orchitis. 2. Sonographer identified echogenic, vascular tissue in the upper right hemiscrotum, apparently tracking from the groin region. Although this did not change with Valsalva maneuver, right groin hernia a consideration. If this is not apparent clinically, follow-up CT of the pelvis may prove helpful to further evaluate. Electronically Signed   By: Minerva Areola  Molli Posey M.D.   On: 12/24/2018 19:04   Ct Abdomen Pelvis W Contrast  Result Date: 12/24/2018 CLINICAL DATA:  Pain and swelling to right scrotum. Fluid collection in the right scrotum as well as possible hernia on ultrasound. EXAM: CT ABDOMEN AND PELVIS WITH CONTRAST TECHNIQUE: Multidetector CT imaging of the abdomen and pelvis was performed using the standard protocol following bolus administration of intravenous contrast. CONTRAST:  OMNIPAQUE IOHEXOL 300 MG/ML  SOLN COMPARISON:  Scrotal ultrasound earlier this day. FINDINGS: Lower chest: The lung bases are clear. Hepatobiliary: Mild hepatic steatosis. No focal abnormality. Gallbladder physiologically distended, no calcified stone. No biliary dilatation. Pancreas: No ductal dilatation or inflammation. Spleen: Normal in size without focal abnormality.  Adrenals/Urinary Tract: Normal adrenal glands. Right hydroureteronephrosis. No obstructing stone. Enhancement of the right ureter with mild periureteric stranding. Mild prominence of the left renal collecting system without ureteral dilatation. Homogeneous renal enhancement urinary bladder is partially distended, bladder wall thickening at the base with questionable hyperemia. Stomach/Bowel: Stomach physiologically distended. No bowel wall thickening, inflammatory change, or obstruction. Administered enteric contrast reaches the colon. No bowel containing inguinal hernia. Vascular/Lymphatic: Aortic atherosclerosis. No aneurysm. Portal vein and mesenteric vessels are patent. No enlarged lymph nodes in the abdomen or pelvis. Reproductive: Heterogeneous prostate gland with question of periprosthetic haziness. There is hyperemia of the right pampiniform plexus in the inguinal canal extending into the scrotum with scrotal hyperemia is seen on ultrasound. Small complex hydrocele without organized scrotal collection. Other: No evidence of perineal fluid collection. No abdominopelvic fluid collection or abscess. No free fluid or free air. Musculoskeletal: Hemi transitional lumbosacral anatomy. There are no acute or suspicious osseous abnormalities. IMPRESSION: 1. Heterogeneous prostate gland with question of periprostatic haziness, suspicious for prostatitis. 2. Urinary bladder wall thickening likely cystitis. Mild right hydroureteronephrosis with ureteral enhancement and periureteral edema, suspect ascending urinary tract infection. No obstructing stone. 3. Hyperemia of the right pampiniform plexus extending from the inguinal canal into the scrotum increased vascularity to the right scrotum, in keeping with sonographic findings of right epididymal orchitis. Complex right hydrocele without organized abscess. 4. No inguinal hernia. Aortic Atherosclerosis (ICD10-I70.0). Electronically Signed   By: Narda Rutherford M.D.   On:  12/24/2018 21:23    Follow up with PCP in 1 week.  Management plans discussed with the patient, family and they are in agreement.  CODE STATUS: Full code    Code Status Orders  (From admission, onward)         Start     Ordered   12/24/18 2246  Full code  Continuous     12/24/18 2245        Code Status History    Date Active Date Inactive Code Status Order ID Comments User Context   10/04/2018 1246 10/09/2018 1356 Full Code [id number]  Ramonita Lab, MD Inpatient   10/04/2018 0806 10/04/2018 1245 Full Code [id number]  Myrna Blazer, MD ED   01/18/2016 1309 01/19/2016 0740 Full Code [id number]  Sharyn Creamer, MD ED      TOTAL TIME TAKING CARE OF THIS PATIENT ON DAY OF DISCHARGE: more than 35 minutes.   Ihor Austin M.D on 12/25/2018 at 10:59 AM  Between 7am to 6pm - Pager - [phone]  After 6pm go to [email or web address] - password [email or web address]  [facility] [place] [facility]  Office  [phone]  CC: Primary care physician; Patient, No Pcp Per  Note: This dictation was prepared with Dragon dictation along with smaller phrase technology. Any transcriptional errors that result from this  process are unintentional.

## 2018-12-27 LAB — URINE CULTURE: Culture: >=100000 — AB

## 2019-01-08 ENCOUNTER — Ambulatory Visit: Payer: Self-pay | Admitting: Urology

## 2019-01-24 ENCOUNTER — Other Ambulatory Visit
Admission: RE | Admit: 2019-01-24 | Discharge: 2019-01-24 | Disposition: A | Discharge status: Home / Self Care | Attending: Family Medicine | Admitting: Family Medicine

## 2019-01-25 ENCOUNTER — Emergency Department
Admission: EM | Admit: 2019-01-25 | Discharge: 2019-01-26 | Disposition: A | Discharge status: Home / Self Care | Payer: PRIVATE HEALTH INSURANCE | Attending: Emergency Medicine | Admitting: Emergency Medicine

## 2019-01-25 ENCOUNTER — Other Ambulatory Visit: Payer: Self-pay

## 2019-01-25 DIAGNOSIS — Z008 Encounter for other general examination: Secondary | ICD-10-CM | POA: Insufficient documentation

## 2019-01-25 DIAGNOSIS — F10920 Alcohol use, unspecified with intoxication, uncomplicated: Secondary | ICD-10-CM

## 2019-01-25 NOTE — ED Notes (Signed)
Patient resting quietly with eyes closed in no acute distress.  

## 2019-01-25 NOTE — ED Triage Notes (Signed)
Patient to ED in custody of  PD for medical clearance for jail.

## 2019-01-25 NOTE — ED Provider Notes (Signed)
[facility] Emergency Department Provider Note   ____________________________________________   First MD Initiated Contact with Patient 01/25/19 [phone]     (approximate)  I have reviewed the triage vital signs and the nursing notes.   HISTORY  Chief Complaint Medical Clearance    HPI Arlis Portaracy Lantzy is a 53 y.o. male [facility] for jail.  Patient's breathalyzer was >300 which necessitated his ED visit.  Several hours later while waiting, police repeated a breathalyzer which is now <300.  Patient soundly asleep and when awakened for interview, patient states he is in DTs.  Denies recent fever, cough, chest pain, shortness of breath, abdominal pain, nausea or vomiting.       Past Medical History:  Diagnosis Date  . Alcohol dependence in remission [facility](HCC)    is [facility] and sober for 28 days   . ETOH abuse   . Inguinal hernia    Right    Patient Active Problem List   Diagnosis Date Noted  . Epididymoorchitis 12/24/2018  . Protein-calorie malnutrition, severe 10/06/2018  . Alcohol withdrawal delirium (HCC) 10/04/2018  . Pedal edema 02/14/2016  . Hernia of abdominal cavity 11/09/2015  . Alcohol dependence in remission [facility](HCC)     Past Surgical History:  Procedure Laterality Date  . MANDIBLE FRACTURE SURGERY      Prior to Admission medications   Medication Sig Start Date End Date Taking? Authorizing Provider  diltiazem (CARDIZEM CD) 180 MG 24 hr capsule Take 1 capsule (180 mg total) by mouth daily. 12/25/18   Ihor AustinPyreddy, Pavan, MD  feeding supplement, ENSURE ENLIVE, (ENSURE ENLIVE) LIQD Take 237 mLs by mouth 3 (three) times daily between meals. 12/25/18   Ihor AustinPyreddy, Pavan, MD  folic acid (FOLVITE) 1 MG tablet Take 1 tablet (1 mg total) by mouth daily. 12/25/18   Ihor AustinPyreddy, Pavan, MD  ibuprofen (ADVIL,MOTRIN) 400 MG tablet Take 0.5 tablets (200 mg total) by mouth every 6 (six) hours as needed. 12/25/18   Ihor AustinPyreddy, Pavan,  MD  thiamine 100 MG tablet Take 1 tablet (100 mg total) by mouth daily. 12/25/18   Ihor AustinPyreddy, Pavan, MD    Allergies Patient has no known allergies.  Family History  Problem Relation Age of Onset  . Diabetes type II Father   . Diabetes Father   . Arthritis Mother     Social History Social History   Tobacco Use  . Smoking status: Never Smoker  . Smokeless tobacco: Never Used  Substance Use Topics  . Alcohol use: Yes    Comment: used to drink five 40 ounce bottles of malt liquor a day; sober May 2017  . Drug use: Not Currently    Types: Marijuana, Cocaine, "Crack" cocaine    Comment: none since 2011    Review of Systems  Constitutional: No fever/chills Eyes: No visual changes. ENT: No sore throat. Cardiovascular: Denies chest pain. Respiratory: Denies shortness of breath. Gastrointestinal: No abdominal pain.  No nausea, no vomiting.  No diarrhea.  No constipation. Genitourinary: Negative for dysuria. Musculoskeletal: Negative for back pain. Skin: Negative for rash. Neurological: Negative for headaches, focal weakness or numbness. Psychiatric:  Positive for intoxication.  ____________________________________________   PHYSICAL EXAM:  VITAL SIGNS: ED Triage Vitals  Enc Vitals Group     BP 01/25/19 0027 (!) 126/92     Pulse Rate 01/25/19 0027 91     Resp 01/25/19 0027 18     Temp 01/25/19 0027 97.7 F (36.5 C)  Temp Source 01/25/19 0027 Oral     SpO2 01/25/19 0027 98 %     Weight 01/25/19 0028 127 lb (57.6 kg)     Height 01/25/19 0028 5\' 8"  (1.727 m)     Head Circumference --      Peak Flow --      Pain Score 01/25/19 0031 0     Pain Loc --      Pain Edu? --      Excl. in GC? --     Constitutional: Alert and oriented.  Awakened for exam.  Well appearing and in no acute distress. Eyes: Conjunctivae are bloodshot bilaterally. PERRL. EOMI. Head: Atraumatic. Nose: Atraumatic. Mouth/Throat: Mucous membranes are moist.  No dental malocclusion. Neck: No  stridor.  No cervical spinal tenderness to palpation. Cardiovascular: Normal rate, regular rhythm. Grossly normal heart sounds.  Good peripheral circulation. Respiratory: Normal respiratory effort.  No retractions. Lungs CTAB. Gastrointestinal: Soft and nontender. No distention. No abdominal bruits. No CVA tenderness. Musculoskeletal: No lower extremity tenderness nor edema.  No joint effusions. Neurologic:  Normal speech and language. No gross focal neurologic deficits are appreciated. No gait instability.  No asterixis.  No tremors. Skin:  Skin is warm, dry and intact. No rash noted. Psychiatric: Mood and affect are intoxicated. Speech and behavior are normal.  ____________________________________________   LABS (all labs ordered are listed, but only abnormal results are displayed)  Labs Reviewed - No data to display ____________________________________________  EKG  None ____________________________________________  RADIOLOGY  ED MD interpretation: None  Official radiology report(s): No results found.  ____________________________________________   PROCEDURES  Procedure(s) performed (including Critical Care):  Procedures   ____________________________________________   INITIAL IMPRESSION / ASSESSMENT AND PLAN / ED COURSE  As part of my medical decision making, I reviewed the following data within the electronic MEDICAL RECORD NUMBER Nursing notes reviewed and incorporated, Old chart reviewed and Notes from prior ED visits     Talmadge Louque was evaluated in Emergency Department on 01/25/2019 for the symptoms described in the history of present illness. He was evaluated in the context of the global COVID-19 pandemic, which necessitated consideration that the patient might be at risk for infection with the SARS-CoV-2 virus that causes COVID-19. Institutional protocols and algorithms that pertain to the evaluation of patients at risk for COVID-19 are in a state of rapid change  based on information released by regulatory bodies including the CDC and federal and state organizations. These policies and algorithms were followed during the patient's care in the ED.   53 year old intoxicated male who presents for medical clearance for jail.  No clinical evidence for DTs.  Breathalyzer now <300 and patient will be taken to jail.  Strict return precautions given.  Patient verbalizes understanding agrees with plan of care.      ____________________________________________   FINAL CLINICAL IMPRESSION(S) / ED DIAGNOSES  Final diagnoses:  Alcoholic intoxication without complication (HCC)  Medical clearance for incarceration     ED Discharge Orders    None       Note:  This document was prepared using Dragon voice recognition software and may include unintentional dictation errors.   Irean Hong, MD 01/25/19 [phone]

## 2019-01-25 NOTE — Discharge Instructions (Addendum)
Return to the ER for worsening symptoms, persistent vomiting, difficulty breathing or other concerns. °

## 2019-01-25 NOTE — ED Notes (Signed)
Patient to ED with [facility] for forensic  blood draw under search warrant.  Search warrant verified by this RN and Josefa Half, RN.

## 2019-01-25 NOTE — ED Notes (Signed)
Specimens obtained from right forearm after area cleansed with betadine.  Patient tolerated well.  Specimens x 2 given to [employer].

## 2019-01-25 NOTE — ED Notes (Signed)
Patient seen by MD while in subwait with [employer] officer. Patient discharged to jail in custody of officer. Here for medical clearance only.

## 2019-02-02 ENCOUNTER — Ambulatory Visit: Payer: Self-pay | Attending: Urology

## 2019-02-05 ENCOUNTER — Ambulatory Visit: Payer: Self-pay | Admitting: Urology

## 2019-02-05 ENCOUNTER — Encounter: Payer: Self-pay | Admitting: Urology

## 2019-08-06 ENCOUNTER — Inpatient Hospital Stay
Admission: EM | Admit: 2019-08-06 | Discharge: 2019-08-12 | DRG: 894 | Discharge status: Against Medical Advice | Attending: Internal Medicine | Admitting: Internal Medicine

## 2019-08-06 ENCOUNTER — Encounter: Payer: Self-pay | Admitting: Emergency Medicine

## 2019-08-06 ENCOUNTER — Emergency Department: Payer: PRIVATE HEALTH INSURANCE

## 2019-08-06 DIAGNOSIS — Z590 Homelessness: Secondary | ICD-10-CM

## 2019-08-06 DIAGNOSIS — F10931 Alcohol use, unspecified with withdrawal delirium: Secondary | ICD-10-CM | POA: Diagnosis present

## 2019-08-06 DIAGNOSIS — I5020 Unspecified systolic (congestive) heart failure: Secondary | ICD-10-CM

## 2019-08-06 DIAGNOSIS — Z833 Family history of diabetes mellitus: Secondary | ICD-10-CM

## 2019-08-06 DIAGNOSIS — I959 Hypotension, unspecified: Secondary | ICD-10-CM | POA: Diagnosis present

## 2019-08-06 DIAGNOSIS — I248 Other forms of acute ischemic heart disease: Secondary | ICD-10-CM | POA: Diagnosis present

## 2019-08-06 DIAGNOSIS — E8729 Other acidosis: Secondary | ICD-10-CM

## 2019-08-06 DIAGNOSIS — E871 Hypo-osmolality and hyponatremia: Secondary | ICD-10-CM | POA: Diagnosis present

## 2019-08-06 DIAGNOSIS — R0902 Hypoxemia: Secondary | ICD-10-CM | POA: Diagnosis present

## 2019-08-06 DIAGNOSIS — E872 Acidosis: Secondary | ICD-10-CM | POA: Diagnosis present

## 2019-08-06 DIAGNOSIS — D638 Anemia in other chronic diseases classified elsewhere: Secondary | ICD-10-CM | POA: Diagnosis present

## 2019-08-06 DIAGNOSIS — I426 Alcoholic cardiomyopathy: Secondary | ICD-10-CM | POA: Diagnosis present

## 2019-08-06 DIAGNOSIS — Z8261 Family history of arthritis: Secondary | ICD-10-CM

## 2019-08-06 DIAGNOSIS — E876 Hypokalemia: Secondary | ICD-10-CM | POA: Diagnosis present

## 2019-08-06 DIAGNOSIS — F10231 Alcohol dependence with withdrawal delirium: Principal | ICD-10-CM | POA: Diagnosis present

## 2019-08-06 DIAGNOSIS — Z5329 Procedure and treatment not carried out because of patient's decision for other reasons: Secondary | ICD-10-CM | POA: Diagnosis present

## 2019-08-06 DIAGNOSIS — Z681 Body mass index (BMI) 19 or less, adult: Secondary | ICD-10-CM

## 2019-08-06 DIAGNOSIS — I5022 Chronic systolic (congestive) heart failure: Secondary | ICD-10-CM | POA: Diagnosis present

## 2019-08-06 DIAGNOSIS — R636 Underweight: Secondary | ICD-10-CM | POA: Diagnosis present

## 2019-08-06 DIAGNOSIS — Z20828 Contact with and (suspected) exposure to other viral communicable diseases: Secondary | ICD-10-CM | POA: Diagnosis present

## 2019-08-06 LAB — ETHANOL: Alcohol, Ethyl (B): <10 mg/dL (ref <10)

## 2019-08-06 LAB — HEPATIC FUNCTION PANEL
ALT: 60 U/L — ABNORMAL HIGH (ref 0–44)
AST: 187 U/L — ABNORMAL HIGH (ref 15–41)
Albumin: 4.2 g/dL (ref 3.5–5.0)
Alkaline Phosphatase: 106 U/L (ref 38–126)
Bilirubin, Direct: 0.6 mg/dL — ABNORMAL HIGH (ref 0.0–0.2)
Indirect Bilirubin: 2.2 mg/dL — ABNORMAL HIGH (ref 0.3–0.9)
Total Bilirubin: 2.8 mg/dL — ABNORMAL HIGH (ref 0.3–1.2)
Total Protein: 8.3 g/dL — ABNORMAL HIGH (ref 6.5–8.1)

## 2019-08-06 LAB — POC SARS CORONAVIRUS 2 AG: SARS Coronavirus 2 Ag: NEGATIVE

## 2019-08-06 LAB — CK: Total CK: 401 U/L — ABNORMAL HIGH (ref 49–397)

## 2019-08-06 LAB — BASIC METABOLIC PANEL
Anion gap: 28 — ABNORMAL HIGH (ref 5–15)
BUN: 5 mg/dL — ABNORMAL LOW (ref 6–20)
CO2: 12 mmol/L — ABNORMAL LOW (ref 22–32)
Calcium: 9.2 mg/dL (ref 8.9–10.3)
Chloride: 85 mmol/L — ABNORMAL LOW (ref 98–111)
Creatinine, Ser: 1 mg/dL (ref 0.61–1.24)
GFR calc Af Amer: >60 mL/min (ref >60)
GFR calc non Af Amer: >60 mL/min (ref >60)
Glucose, Bld: 115 mg/dL — ABNORMAL HIGH (ref 70–99)
Potassium: 4.9 mmol/L (ref 3.5–5.1)
Sodium: 125 mmol/L — ABNORMAL LOW (ref 135–145)

## 2019-08-06 LAB — CBC
HCT: 37.4 % — ABNORMAL LOW (ref 39.0–52.0)
Hemoglobin: 13 g/dL (ref 13.0–17.0)
MCH: 31.6 pg (ref 26.0–34.0)
MCHC: 34.8 g/dL (ref 30.0–36.0)
MCV: 91 fL (ref 80.0–100.0)
Platelets: 157 10*3/uL (ref 150–400)
RBC: 4.11 MIL/uL — ABNORMAL LOW (ref 4.22–5.81)
RDW: 12.2 % (ref 11.5–15.5)
WBC: 7.4 10*3/uL (ref 4.0–10.5)
nRBC: 0 % (ref 0.0–0.2)

## 2019-08-06 LAB — LACTIC ACID, PLASMA
Lactic Acid, Venous: >11 mmol/L (ref 0.5–1.9)
Lactic Acid, Venous: 5.6 mmol/L (ref 0.5–1.9)

## 2019-08-06 LAB — TROPONIN I (HIGH SENSITIVITY): Troponin I (High Sensitivity): 264 ng/L (ref <18)

## 2019-08-06 MED ORDER — SODIUM CHLORIDE 0.9 % IV SOLN
2.0000 g | Freq: Once | INTRAVENOUS | Status: AC
  Administered 2019-08-06: 2 g via INTRAVENOUS
  Filled 2019-08-06: qty 2

## 2019-08-06 MED ORDER — LORAZEPAM 2 MG/ML IJ SOLN
INTRAMUSCULAR | Status: AC
  Administered 2019-08-06: 2 mg via INTRAVENOUS
  Filled 2019-08-06: qty 1

## 2019-08-06 MED ORDER — LORAZEPAM 2 MG PO TABS: 0.0000 mg | ORAL_TABLET | Freq: Four times a day (QID) | ORAL | Status: AC

## 2019-08-06 MED ORDER — LORAZEPAM 2 MG/ML IJ SOLN
0.0000 mg | Freq: Two times a day (BID) | INTRAMUSCULAR | Status: DC
  Administered 2019-08-09: 4 mg via INTRAVENOUS
  Administered 2019-08-10: 1 mg via INTRAVENOUS
  Filled 2019-08-06: qty 2
  Filled 2019-08-06: qty 1

## 2019-08-06 MED ORDER — LORAZEPAM 2 MG/ML IJ SOLN
2.0000 mg | Freq: Once | INTRAMUSCULAR | Status: AC
  Administered 2019-08-06: 23:00:00 2 mg via INTRAVENOUS

## 2019-08-06 MED ORDER — SODIUM CHLORIDE 0.9 % IV BOLUS
1000.0000 mL | Freq: Once | INTRAVENOUS | Status: AC
  Administered 2019-08-06: 1000 mL via INTRAVENOUS

## 2019-08-06 MED ORDER — METRONIDAZOLE IN NACL 5-0.79 MG/ML-% IV SOLN
500.0000 mg | Freq: Once | INTRAVENOUS | Status: AC
  Administered 2019-08-06: 500 mg via INTRAVENOUS
  Filled 2019-08-06: qty 100

## 2019-08-06 MED ORDER — LORAZEPAM 2 MG/ML IJ SOLN
INTRAMUSCULAR | Status: AC
  Administered 2019-08-06: 22:00:00 1 mg via INTRAVENOUS
  Filled 2019-08-06: qty 1

## 2019-08-06 MED ORDER — LORAZEPAM 2 MG/ML IJ SOLN
0.0000 mg | Freq: Four times a day (QID) | INTRAMUSCULAR | Status: AC
  Administered 2019-08-07 (×3): 2 mg via INTRAVENOUS
  Filled 2019-08-06: qty 1
  Filled 2019-08-06: qty 2
  Filled 2019-08-06: qty 1

## 2019-08-06 MED ORDER — THIAMINE HCL 100 MG/ML IJ SOLN: 100.0000 mg | Freq: Every day | INTRAMUSCULAR | Status: DC

## 2019-08-06 MED ORDER — VANCOMYCIN HCL 500 MG IV SOLR
500.0000 mg | Freq: Once | INTRAVENOUS | Status: AC
  Administered 2019-08-06: 500 mg via INTRAVENOUS
  Filled 2019-08-06: qty 500

## 2019-08-06 MED ORDER — VANCOMYCIN HCL IN DEXTROSE 1-5 GM/200ML-% IV SOLN
1000.0000 mg | Freq: Once | INTRAVENOUS | Status: AC
  Administered 2019-08-06: 1000 mg via INTRAVENOUS
  Filled 2019-08-06: qty 200

## 2019-08-06 MED ORDER — LORAZEPAM 2 MG/ML IJ SOLN
2.0000 mg | Freq: Once | INTRAMUSCULAR | Status: AC
  Administered 2019-08-06: 2 mg via INTRAVENOUS

## 2019-08-06 MED ORDER — DEXTROSE-NACL 5-0.9 % IV SOLN
INTRAVENOUS | Status: DC
  Administered 2019-08-06: 23:00:00 via INTRAVENOUS

## 2019-08-06 MED ORDER — LORAZEPAM 2 MG PO TABS: 0.0000 mg | ORAL_TABLET | Freq: Two times a day (BID) | ORAL | Status: DC

## 2019-08-06 MED ORDER — LORAZEPAM 2 MG/ML IJ SOLN: 1.0000 mg | Freq: Once | INTRAMUSCULAR | Status: DC

## 2019-08-06 MED ORDER — LORAZEPAM 2 MG/ML IJ SOLN
1.0000 mg | Freq: Once | INTRAMUSCULAR | Status: AC
  Administered 2019-08-06: 22:00:00 1 mg via INTRAVENOUS

## 2019-08-06 MED ORDER — VITAMIN B-1 100 MG PO TABS
100.0000 mg | ORAL_TABLET | Freq: Every day | ORAL | Status: DC
  Administered 2019-08-07 – 2019-08-12 (×5): 100 mg via ORAL
  Filled 2019-08-06 (×5): qty 1

## 2019-08-06 NOTE — Progress Notes (Signed)
CODE SEPSIS - PHARMACY COMMUNICATION  **Broad Spectrum Antibiotics should be administered within 1 hour of Sepsis diagnosis**  Time Code Sepsis Called/Page Received:   11/20 @ 2200   Antibiotics Ordered: Vanc, Cefepime   Time of 1st antibiotic administration: Vancomycin 1 gm IV X 1 @ 2218   Additional action taken by pharmacy:   If necessary, Name of Provider/Nurse Contacted:     Gimena Buick D ,PharmD Clinical Pharmacist  08/06/2019  10:25 PM

## 2019-08-06 NOTE — Progress Notes (Signed)
PHARMACY -  BRIEF ANTIBIOTIC NOTE   Pharmacy has received consult(s) for Vancomycin , Cefepime from an ED provider.  The patient's profile has been reviewed for ht/wt/allergies/indication/available labs.    One time order(s) placed for Vancomycin 1500 mg IV X 1 and Cefepime 2 gm IV X 1.   Further antibiotics/pharmacy consults should be ordered by admitting physician if indicated.                       Thank you, Kirill Chatterjee D 08/06/2019  10:24 PM

## 2019-08-06 NOTE — ED Triage Notes (Signed)
Pt arrived via EMS. Per EMS, pt was found in car cold and hypoxic at 79% RA. Pt arrived to ED shaking, cold, able to answer questions and O2 at 2L with reading at 92%. MD at bedside.

## 2019-08-06 NOTE — ED Notes (Signed)
Pt has become diaphoretic, O2 dropping, tremors worsening. Pt sat straight up and respiratory called for Bipap placement. Pt still able to communicate and answer questions.

## 2019-08-06 NOTE — ED Provider Notes (Addendum)
[facility] Emergency Department Provider Note  ____________________________________________   First MD Initiated Contact with Patient 08/06/19 2131     (approximate)  I have reviewed the triage vital signs and the nursing notes.   HISTORY  Chief Complaint Altered Mental Status    HPI Kristopher Duran is a 53 y.o. male with alcohol dependence who presents with altered mental status.  Patient is homeless and was found in his car.  Patient says that he was drinking alcohol and fell asleep in his car.  Patient was very cold to touch.  Unclear how long he was in the car for.  Patient is shivering.  He denies any chest pain but does have some shortness of breath.  Denies any abdominal pain.  Shortness of breath is severe, unclear when it started, and nothing makes it better, nothing makes it worse.       Past Medical History:  Diagnosis Date  . Alcohol dependence in remission [facility])    is at [facility] and sober for 28 days   . ETOH abuse   . Inguinal hernia    Right    Patient Active Problem List   Diagnosis Date Noted  . Epididymoorchitis 12/24/2018  . Protein-calorie malnutrition, severe 10/06/2018  . Alcohol withdrawal delirium ([place]) 10/04/2018  . Pedal edema 02/14/2016  . Hernia of abdominal cavity 11/09/2015  . Alcohol dependence in remission [facility])     Past Surgical History:  Procedure Laterality Date  . MANDIBLE FRACTURE SURGERY      Prior to Admission medications   Medication Sig Start Date End Date Taking? Authorizing Provider  diltiazem (CARDIZEM CD) 180 MG 24 hr capsule Take 1 capsule (180 mg total) by mouth daily. 12/25/18   Saundra Shelling, MD  feeding supplement, ENSURE ENLIVE, (ENSURE ENLIVE) LIQD Take 237 mLs by mouth 3 (three) times daily between meals. 12/25/18   Saundra Shelling, MD  folic acid (FOLVITE) 1 MG tablet Take 1 tablet (1 mg total) by mouth daily. 12/25/18   Saundra Shelling, MD  ibuprofen (ADVIL,MOTRIN) 400 MG tablet Take 0.5  tablets (200 mg total) by mouth every 6 (six) hours as needed. 12/25/18   Saundra Shelling, MD  thiamine 100 MG tablet Take 1 tablet (100 mg total) by mouth daily. 12/25/18   Saundra Shelling, MD    Allergies Patient has no known allergies.  Family History  Problem Relation Age of Onset  . Diabetes type II Father   . Diabetes Father   . Arthritis Mother     Social History Social History   Tobacco Use  . Smoking status: Never Smoker  . Smokeless tobacco: Never Used  Substance Use Topics  . Alcohol use: Yes    Comment: used to drink five 40 ounce bottles of malt liquor a day; sober May 2017  . Drug use: Not Currently    Types: Marijuana, Cocaine, "Crack" cocaine    Comment: none since 2011      Review of Systems Constitutional: No fever/chills Eyes: No visual changes. ENT: No sore throat. Cardiovascular: Denies chest pain. Respiratory: Positive shortness of breath. Gastrointestinal: No abdominal pain.  No nausea, no vomiting.  No diarrhea.  No constipation. Genitourinary: Negative for dysuria. Musculoskeletal: Negative for back pain. Skin: Negative for rash. Neurological: Negative for headaches, focal weakness or numbness.  Positive tremors All other ROS negative ____________________________________________   PHYSICAL EXAM:  VITAL SIGNS: Blood pressure 95/74, pulse (!) 142, resp. rate (!) 23, height 5\' 9"  (1.753 m), weight 58 kg, SpO2  100 %.  Constitutional: Alert and oriented but shaking all over. Eyes: Conjunctivae are normal. EOMI. Head: Atraumatic. Nose: No congestion/rhinnorhea. Mouth/Throat: Mucous membranes are moist.   Neck: No stridor. Trachea Midline. FROM Cardiovascular:tachycardia, regular rhythm. Grossly normal heart sounds.  Good peripheral circulation. Respiratory: Increased work of breathing but clear lungs Gastrointestinal: Soft and nontender. No distention. No abdominal bruits.  Musculoskeletal: No lower extremity tenderness nor edema.  No joint  effusions. Neurologic:  Normal speech and language.  Shaking of his extremities. Skin:  Skin is warm, dry and intact. No rash noted. Psychiatric: Mood and affect are normal. Speech and behavior are normal. GU: Deferred   ____________________________________________   LABS (all labs ordered are listed, but only abnormal results are displayed)  Labs Reviewed  BASIC METABOLIC PANEL - Abnormal; Notable for the following components:      Result Value   Sodium 125 (*)    Chloride 85 (*)    CO2 12 (*)    Glucose, Bld 115 (*)    BUN 5 (*)    Anion gap 28 (*)    All other components within normal limits  CBC - Abnormal; Notable for the following components:   RBC 4.11 (*)    HCT 37.4 (*)    All other components within normal limits  LACTIC ACID, PLASMA - Abnormal; Notable for the following components:   Lactic Acid, Venous >11.0 (*)    All other components within normal limits  HEPATIC FUNCTION PANEL - Abnormal; Notable for the following components:   Total Protein 8.3 (*)    AST 187 (*)    ALT 60 (*)    Total Bilirubin 2.8 (*)    Bilirubin, Direct 0.6 (*)    Indirect Bilirubin 2.2 (*)    All other components within normal limits  BLOOD GAS, VENOUS - Abnormal; Notable for the following components:   pCO2, Ven 30 (*)    pO2, Ven <31.0 (*)    Bicarbonate 15.8 (*)    Acid-base deficit 8.8 (*)    All other components within normal limits  CK - Abnormal; Notable for the following components:   Total CK 401 (*)    All other components within normal limits  LACTIC ACID, PLASMA - Abnormal; Notable for the following components:   Lactic Acid, Venous 5.6 (*)    All other components within normal limits  TROPONIN I (HIGH SENSITIVITY) - Abnormal; Notable for the following components:   Troponin I (High Sensitivity) 264 (*)    All other components within normal limits  CULTURE, BLOOD (ROUTINE X 2)  CULTURE, BLOOD (ROUTINE X 2)  SARS CORONAVIRUS 2 (TAT 6-24 HRS)  ETHANOL  COOXEMETRY  PANEL  URINALYSIS, COMPLETE (UACMP) WITH MICROSCOPIC  URINE DRUG SCREEN, QUALITATIVE ([facility] ONLY)  CARBON MONOXIDE, BLOOD (PERFORMED AT REF LAB)  OSMOLALITY  CBG MONITORING, ED  POC SARS CORONAVIRUS 2 AG -  ED  POC SARS CORONAVIRUS 2 AG   ____________________________________________   ED ECG REPORT I, Concha SeMary E Lashonna Rieke, the attending physician, personally viewed and interpreted this ECG.  EKG sinus tachycardia rate of 144, no ST elevation, no T wave inversion, normal intervals ____________________________________________  RADIOLOGY Vela ProseI, Alanea Woolridge E Asli Tokarski, personally viewed and evaluated these images (plain radiographs) as part of my medical decision making, as well as reviewing the written report by the radiologist.  ED MD interpretation: No pneumonia  Official radiology report(s): Dg Chest Portable 1 View  Result Date: 08/06/2019 CLINICAL DATA:  Shortness of breath EXAM: PORTABLE CHEST 1  VIEW COMPARISON:  Chest radiograph dated 09/15/2015 FINDINGS: The heart size and mediastinal contours are within normal limits. Both lungs are clear. The visualized skeletal structures are unremarkable. IMPRESSION: No active disease. Electronically Signed   By: Romona Curls M.D.   On: 08/06/2019 22:03    ____________________________________________   PROCEDURES  Procedure(s) performed (including Critical Care):  .Critical Care Performed by: Concha Se, MD Authorized by: Concha Se, MD   Critical care provider statement:    Critical care time (minutes):  45   Critical care was necessary to treat or prevent imminent or life-threatening deterioration of the following conditions:  Toxidrome and sepsis   Critical care was time spent personally by me on the following activities:  Discussions with consultants, evaluation of patient's response to treatment, examination of patient, ordering and performing treatments and interventions, ordering and review of laboratory studies, ordering and review of  radiographic studies, pulse oximetry, re-evaluation of patient's condition, obtaining history from patient or surrogate and review of old charts     ____________________________________________   INITIAL IMPRESSION / ASSESSMENT AND PLAN / ED COURSE  Kristopher Duran was evaluated in Emergency Department on 08/06/2019 for the symptoms described in the history of present illness. He was evaluated in the context of the global COVID-19 pandemic, which necessitated consideration that the patient might be at risk for infection with the SARS-CoV-2 virus that causes COVID-19. Institutional protocols and algorithms that pertain to the evaluation of patients at risk for COVID-19 are in a state of rapid change based on information released by regulatory bodies including the CDC and federal and state organizations. These policies and algorithms were followed during the patient's care in the ED.    Patient is a 53 year old gentleman with history of alcohol abuse who presents with tachycardia, hypoxia, increased work of breathing.  Bedside ultrasound without evidence of effusion.  He has breath sounds bilaterally.  Chest x-ray no evidence of pneumothorax or pneumonia.  Will get labs to evaluate for AKI, electrolyte abnormalities.  Given patient's significant work of breathing will attempt to put patient on BiPAP given he is on a nonrebreather. Lower suspicion ICH given he is AO x3 and MAEW.  Initially ordered CT PE but when pt was able to be weaned off oxygen and seemed like his WOB was more metabolic related I d/ced the PE.   Patient did not tolerate BiPAP and so placed back on a nonrebreather.  Rectal temperature was 99.3.  Lactate was greater than 11.  Sepsis protocol started.  Start on broad-spectrum antibiotics.  Troponin elevated most likely secondary to demand ischemia from the tachycardia.  We will continue to trend out cardiac markers to see if needs heparin.   Patient pH is 7.33 and a PCO2 of 30.   Therefore does not look like he is retaining CO2.  Appears the patient is hyperventilating most likely secondary to acidosis from alcoholic ketoacidosis.  Patient given some dextrose and fluid and continued fluid resuscitation.  Patient also given Ativan for DTs.  Patient had off to incoming team I have discussed with the hospital team to admit him to the ICU.  He may need intubation for DTs.  ____________________________________________   FINAL CLINICAL IMPRESSION(S) / ED DIAGNOSES   Final diagnoses:  Delirium tremens (HCC)  Alcoholic ketoacidosis      MEDICATIONS GIVEN DURING THIS VISIT:  Medications  dextrose 5 %-0.9 % sodium chloride infusion ( Intravenous New Bag/Given 08/06/19 2316)  LORazepam (ATIVAN) injection 0-4 mg (has no administration in  time range)    Or  LORazepam (ATIVAN) tablet 0-4 mg (has no administration in time range)  LORazepam (ATIVAN) injection 0-4 mg (has no administration in time range)    Or  LORazepam (ATIVAN) tablet 0-4 mg (has no administration in time range)  thiamine (VITAMIN B-1) tablet 100 mg (has no administration in time range)    Or  thiamine (B-1) injection 100 mg (has no administration in time range)  sodium chloride 0.9 % bolus 1,000 mL (0 mLs Intravenous Stopped 08/06/19 2359)  ceFEPIme (MAXIPIME) 2 g in sodium chloride 0.9 % 100 mL IVPB (0 g Intravenous Stopped 08/06/19 2311)  metroNIDAZOLE (FLAGYL) IVPB 500 mg (0 mg Intravenous Stopped 08/06/19 2352)  vancomycin (VANCOCIN) IVPB 1000 mg/200 mL premix (0 mg Intravenous Stopped 08/06/19 2319)  LORazepam (ATIVAN) injection 1 mg (1 mg Intravenous Given 08/06/19 2217)  vancomycin (VANCOCIN) 500 mg in sodium chloride 0.9 % 100 mL IVPB (0 mg Intravenous Stopped 08/06/19 2352)  LORazepam (ATIVAN) injection 2 mg (2 mg Intravenous Given 08/06/19 2314)  LORazepam (ATIVAN) injection 2 mg (2 mg Intravenous Given 08/06/19 2353)     ED Discharge Orders    None       Note:  This document was  prepared using Dragon voice recognition software and may include unintentional dictation errors.   Concha Se, MD 08/07/19 1275    Concha Se, MD 08/07/19 Lazarus Gowda    Concha Se, MD 08/07/19 1022

## 2019-08-07 ENCOUNTER — Inpatient Hospital Stay (HOSPITAL_COMMUNITY)
Admit: 2019-08-07 | Discharge: 2019-08-07 | Disposition: A | Discharge status: Home / Self Care | Payer: PRIVATE HEALTH INSURANCE | Attending: Family Medicine | Admitting: Family Medicine

## 2019-08-07 DIAGNOSIS — I5020 Unspecified systolic (congestive) heart failure: Secondary | ICD-10-CM | POA: Diagnosis not present

## 2019-08-07 DIAGNOSIS — R778 Other specified abnormalities of plasma proteins: Secondary | ICD-10-CM

## 2019-08-07 DIAGNOSIS — E871 Hypo-osmolality and hyponatremia: Secondary | ICD-10-CM | POA: Diagnosis not present

## 2019-08-07 DIAGNOSIS — K7010 Alcoholic hepatitis without ascites: Secondary | ICD-10-CM

## 2019-08-07 DIAGNOSIS — F10231 Alcohol dependence with withdrawal delirium: Principal | ICD-10-CM

## 2019-08-07 DIAGNOSIS — F10931 Alcohol use, unspecified with withdrawal delirium: Secondary | ICD-10-CM | POA: Diagnosis present

## 2019-08-07 DIAGNOSIS — E872 Acidosis: Secondary | ICD-10-CM

## 2019-08-07 LAB — COOXEMETRY PANEL
Carboxyhemoglobin: 0.6 % (ref 0.5–1.5)
Methemoglobin: 0.8 % (ref 0.0–1.5)
O2 Saturation: 98.2 %
Total oxygen content: 97.7 mL/dL

## 2019-08-07 LAB — COMPREHENSIVE METABOLIC PANEL
ALT: 45 U/L — ABNORMAL HIGH (ref 0–44)
AST: 127 U/L — ABNORMAL HIGH (ref 15–41)
Albumin: 3.3 g/dL — ABNORMAL LOW (ref 3.5–5.0)
Alkaline Phosphatase: 85 U/L (ref 38–126)
Anion gap: 12 (ref 5–15)
BUN: 6 mg/dL (ref 6–20)
CO2: 21 mmol/L — ABNORMAL LOW (ref 22–32)
Calcium: 7.5 mg/dL — ABNORMAL LOW (ref 8.9–10.3)
Chloride: 95 mmol/L — ABNORMAL LOW (ref 98–111)
Creatinine, Ser: 0.66 mg/dL (ref 0.61–1.24)
GFR calc Af Amer: >60 mL/min (ref >60)
GFR calc non Af Amer: >60 mL/min (ref >60)
Glucose, Bld: 104 mg/dL — ABNORMAL HIGH (ref 70–99)
Potassium: 3 mmol/L — ABNORMAL LOW (ref 3.5–5.1)
Sodium: 128 mmol/L — ABNORMAL LOW (ref 135–145)
Total Bilirubin: 2.4 mg/dL — ABNORMAL HIGH (ref 0.3–1.2)
Total Protein: 6.3 g/dL — ABNORMAL LOW (ref 6.5–8.1)

## 2019-08-07 LAB — MRSA PCR SCREENING: MRSA by PCR: NEGATIVE

## 2019-08-07 LAB — OSMOLALITY, URINE: Osmolality, Ur: 528 mOsm/kg (ref 300–900)

## 2019-08-07 LAB — URINALYSIS, COMPLETE (UACMP) WITH MICROSCOPIC
Bacteria, UA: NONE SEEN
Bilirubin Urine: NEGATIVE
Glucose, UA: NEGATIVE mg/dL
Hgb urine dipstick: NEGATIVE
Ketones, ur: 5 mg/dL — AB
Leukocytes,Ua: NEGATIVE
Nitrite: NEGATIVE
Protein, ur: NEGATIVE mg/dL
Specific Gravity, Urine: 1.015 (ref 1.005–1.030)
pH: 5 (ref 5.0–8.0)

## 2019-08-07 LAB — ECHOCARDIOGRAM COMPLETE
Height: 69 in
Weight: 2045.87 oz

## 2019-08-07 LAB — PROTIME-INR
INR: 1.3 — ABNORMAL HIGH (ref 0.8–1.2)
Prothrombin Time: 15.9 seconds — ABNORMAL HIGH (ref 11.4–15.2)

## 2019-08-07 LAB — MAGNESIUM: Magnesium: 1.5 mg/dL — ABNORMAL LOW (ref 1.7–2.4)

## 2019-08-07 LAB — TROPONIN I (HIGH SENSITIVITY)
Troponin I (High Sensitivity): 665 ng/L (ref <18)
Troponin I (High Sensitivity): 649 ng/L (ref <18)

## 2019-08-07 LAB — URINE DRUG SCREEN, QUALITATIVE (ARMC ONLY)
Amphetamines, Ur Screen: NOT DETECTED
Barbiturates, Ur Screen: NOT DETECTED
Benzodiazepine, Ur Scrn: POSITIVE — AB
Cannabinoid 50 Ng, Ur ~~LOC~~: NOT DETECTED
Cocaine Metabolite,Ur ~~LOC~~: NOT DETECTED
MDMA (Ecstasy)Ur Screen: NOT DETECTED
Methadone Scn, Ur: NOT DETECTED
Opiate, Ur Screen: NOT DETECTED
Phencyclidine (PCP) Ur S: NOT DETECTED
Tricyclic, Ur Screen: NOT DETECTED

## 2019-08-07 LAB — OSMOLALITY: Osmolality: 270 mOsm/kg — ABNORMAL LOW (ref 275–295)

## 2019-08-07 LAB — NA AND K (SODIUM & POTASSIUM), RAND UR
Potassium Urine: 57 mmol/L
Sodium, Ur: 102 mmol/L

## 2019-08-07 LAB — CBC
HCT: 30.5 % — ABNORMAL LOW (ref 39.0–52.0)
Hemoglobin: 11.4 g/dL — ABNORMAL LOW (ref 13.0–17.0)
MCH: 31.8 pg (ref 26.0–34.0)
MCHC: 37.4 g/dL — ABNORMAL HIGH (ref 30.0–36.0)
MCV: 85 fL (ref 80.0–100.0)
Platelets: 125 10*3/uL — ABNORMAL LOW (ref 150–400)
RBC: 3.59 MIL/uL — ABNORMAL LOW (ref 4.22–5.81)
RDW: 12 % (ref 11.5–15.5)
WBC: 6.6 10*3/uL (ref 4.0–10.5)
nRBC: 0 % (ref 0.0–0.2)

## 2019-08-07 LAB — SARS CORONAVIRUS 2 (TAT 6-24 HRS): SARS Coronavirus 2: NEGATIVE

## 2019-08-07 LAB — TSH: TSH: 5.419 u[IU]/mL — ABNORMAL HIGH (ref 0.350–4.500)

## 2019-08-07 LAB — GLUCOSE, CAPILLARY
Glucose-Capillary: 85 mg/dL (ref 70–99)
Glucose-Capillary: 95 mg/dL (ref 70–99)

## 2019-08-07 MED ORDER — FOLIC ACID 1 MG PO TABS: 1.0000 mg | ORAL_TABLET | Freq: Every day | ORAL | Status: DC

## 2019-08-07 MED ORDER — ACETAMINOPHEN 325 MG PO TABS: 650.0000 mg | ORAL_TABLET | Freq: Four times a day (QID) | ORAL | Status: DC | PRN

## 2019-08-07 MED ORDER — CHLORHEXIDINE GLUCONATE CLOTH 2 % EX PADS
6.0000 | MEDICATED_PAD | Freq: Every day | CUTANEOUS | Status: DC
  Administered 2019-08-07 – 2019-08-12 (×4): 6 via TOPICAL

## 2019-08-07 MED ORDER — FOLIC ACID 1 MG PO TABS
1.0000 mg | ORAL_TABLET | Freq: Every day | ORAL | Status: DC
  Administered 2019-08-07 – 2019-08-12 (×5): 1 mg via ORAL
  Filled 2019-08-07 (×5): qty 1

## 2019-08-07 MED ORDER — ASPIRIN EC 325 MG PO TBEC
325.0000 mg | DELAYED_RELEASE_TABLET | Freq: Every day | ORAL | Status: DC
  Administered 2019-08-07 – 2019-08-12 (×5): 325 mg via ORAL
  Filled 2019-08-07 (×5): qty 1

## 2019-08-07 MED ORDER — CHLORDIAZEPOXIDE HCL 25 MG PO CAPS
25.0000 mg | ORAL_CAPSULE | Freq: Three times a day (TID) | ORAL | Status: AC
  Administered 2019-08-08 (×3): 25 mg via ORAL
  Filled 2019-08-07 (×3): qty 1

## 2019-08-07 MED ORDER — LABETALOL HCL 5 MG/ML IV SOLN: 20.0000 mg | INTRAVENOUS | Status: DC | PRN

## 2019-08-07 MED ORDER — NITROGLYCERIN 0.4 MG SL SUBL: 0.4000 mg | SUBLINGUAL_TABLET | SUBLINGUAL | Status: DC | PRN

## 2019-08-07 MED ORDER — SODIUM CHLORIDE 0.9 % IV BOLUS
1000.0000 mL | Freq: Once | INTRAVENOUS | Status: AC
  Administered 2019-08-07: 13:00:00 1000 mL via INTRAVENOUS

## 2019-08-07 MED ORDER — ACETAMINOPHEN 650 MG RE SUPP: 650.0000 mg | Freq: Four times a day (QID) | RECTAL | Status: DC | PRN

## 2019-08-07 MED ORDER — DILTIAZEM HCL ER COATED BEADS 180 MG PO CP24
180.0000 mg | ORAL_CAPSULE | Freq: Every day | ORAL | Status: DC
  Filled 2019-08-07 (×3): qty 1

## 2019-08-07 MED ORDER — MAGNESIUM SULFATE 2 GM/50ML IV SOLN
2.0000 g | Freq: Once | INTRAVENOUS | Status: AC
  Administered 2019-08-07: 2 g via INTRAVENOUS
  Filled 2019-08-07: qty 50

## 2019-08-07 MED ORDER — SODIUM CHLORIDE 0.9 % IV SOLN
INTRAVENOUS | Status: DC
  Administered 2019-08-07 (×3): via INTRAVENOUS

## 2019-08-07 MED ORDER — CHLORDIAZEPOXIDE HCL 25 MG PO CAPS: 25.0000 mg | ORAL_CAPSULE | Freq: Every day | ORAL | Status: AC

## 2019-08-07 MED ORDER — METOPROLOL TARTRATE 25 MG PO TABS
25.0000 mg | ORAL_TABLET | Freq: Two times a day (BID) | ORAL | Status: DC
  Administered 2019-08-09 – 2019-08-11 (×2): 25 mg via ORAL
  Filled 2019-08-07 (×2): qty 1

## 2019-08-07 MED ORDER — TRAZODONE HCL 50 MG PO TABS
25.0000 mg | ORAL_TABLET | Freq: Every evening | ORAL | Status: DC | PRN
  Administered 2019-08-11: 23:00:00 25 mg via ORAL
  Filled 2019-08-07: qty 1

## 2019-08-07 MED ORDER — ATORVASTATIN CALCIUM 20 MG PO TABS
40.0000 mg | ORAL_TABLET | Freq: Every day | ORAL | Status: DC
  Administered 2019-08-08 – 2019-08-11 (×3): 40 mg via ORAL
  Filled 2019-08-07 (×3): qty 2

## 2019-08-07 MED ORDER — POTASSIUM CHLORIDE 20 MEQ PO PACK
40.0000 meq | PACK | ORAL | Status: DC
  Administered 2019-08-07: 40 meq via ORAL
  Filled 2019-08-07: qty 2

## 2019-08-07 MED ORDER — CHLORDIAZEPOXIDE HCL 25 MG PO CAPS
25.0000 mg | ORAL_CAPSULE | ORAL | Status: AC
  Administered 2019-08-09: 25 mg via ORAL
  Filled 2019-08-07: qty 1

## 2019-08-07 MED ORDER — ADULT MULTIVITAMIN W/MINERALS CH
1.0000 | ORAL_TABLET | Freq: Every day | ORAL | Status: DC
  Administered 2019-08-07 – 2019-08-12 (×5): 1 via ORAL
  Filled 2019-08-07 (×5): qty 1

## 2019-08-07 MED ORDER — THIAMINE HCL 100 MG PO TABS: 100.0000 mg | ORAL_TABLET | Freq: Every day | ORAL | Status: DC

## 2019-08-07 MED ORDER — MAGNESIUM HYDROXIDE 400 MG/5ML PO SUSP
30.0000 mL | Freq: Every day | ORAL | Status: DC | PRN
  Filled 2019-08-07: qty 30

## 2019-08-07 MED ORDER — ENOXAPARIN SODIUM 40 MG/0.4ML ~~LOC~~ SOLN
40.0000 mg | SUBCUTANEOUS | Status: DC
  Administered 2019-08-07 – 2019-08-08 (×2): 40 mg via SUBCUTANEOUS
  Filled 2019-08-07 (×2): qty 0.4

## 2019-08-07 MED ORDER — ONDANSETRON HCL 4 MG PO TABS: 4.0000 mg | ORAL_TABLET | Freq: Four times a day (QID) | ORAL | Status: DC | PRN

## 2019-08-07 MED ORDER — ONDANSETRON HCL 4 MG/2ML IJ SOLN: 4.0000 mg | Freq: Four times a day (QID) | INTRAMUSCULAR | Status: DC | PRN

## 2019-08-07 MED ORDER — VITAMIN B-1 100 MG PO TABS: 100.0000 mg | ORAL_TABLET | Freq: Every day | ORAL | Status: DC

## 2019-08-07 MED ORDER — MORPHINE SULFATE (PF) 2 MG/ML IV SOLN: 2.0000 mg | INTRAVENOUS | Status: DC | PRN

## 2019-08-07 MED ORDER — KCL IN DEXTROSE-NACL 40-5-0.9 MEQ/L-%-% IV SOLN
INTRAVENOUS | Status: DC
  Administered 2019-08-07 – 2019-08-09 (×4): via INTRAVENOUS
  Filled 2019-08-07 (×8): qty 1000

## 2019-08-07 MED ORDER — CHLORDIAZEPOXIDE HCL 25 MG PO CAPS
25.0000 mg | ORAL_CAPSULE | Freq: Four times a day (QID) | ORAL | Status: AC
  Administered 2019-08-07 (×2): 25 mg via ORAL
  Filled 2019-08-07 (×2): qty 1

## 2019-08-07 NOTE — Progress Notes (Signed)
ABG results shown to dr.brown.

## 2019-08-07 NOTE — H&P (Signed)
[place] [facility] [place] NAME: Kristopher Duran    MR#:  [id number]  DATE OF BIRTH:  11/25/1965  DATE OF ADMISSION:  08/06/2019  PRIMARY CARE PHYSICIAN: Patient, No Pcp Per   REQUESTING/REFERRING PHYSICIAN: Marjean Donna, MD CHIEF COMPLAINT:   Chief Complaint  Patient presents with   Altered Mental Status    HISTORY OF PRESENT ILLNESS:  Kristopher Duran  is a 53 y.o. male with a known history of alcohol abuse, dependence and withdrawal seizures who presented to the emergency room with a consult of altered mental status with delirium.  The patient is homeless and was found in his car.  He has been drinking and apparently fell asleep in his car.  It is unclear how long he has been there.  He was shivering and was cold to touch.  He was fairly incoherent during my interview and therefore no history could be obtained.  He earlier denies any chest painor palpitations.  Denies any nausea or vomiting or abdominal pain.  He admitted to dyspnea and was mildly tachypneic.  Upon presentation to the emergency room, he was tachycardic with a heart rate 163 approximately 90% on room air and later on both been improving with hydration and IV Ativan.  He was significantly restless in the ER.  His EKG showed sinus tachycardia with a rate of 144 with Q waves in V1 and V2 and early repolarization in V3.  His troponin I was 264.  His LFTs were elevated.  He was hyponatremic and hypokalemic and his CO2 was 12.  He was given Ativan 1 mg IV x5 and given significantly elevated lactic acid more than 11 initially he was empirically treated with IV cefepime and vancomycin.  He was placed on CIWA protocol.  His lactic acid later on was significantly better.  He will be admitted to a stepdown unit for further evaluation and management. PAST MEDICAL HISTORY:   Past Medical History:  Diagnosis Date   Alcohol dependence in remission ([place])    is at [facility] and sober for 28 days    ETOH abuse     Inguinal hernia    Right    PAST SURGICAL HISTORY:   Past Surgical History:  Procedure Laterality Date   MANDIBLE FRACTURE SURGERY      SOCIAL HISTORY:   Social History   Tobacco Use   Smoking status: Never Smoker   Smokeless tobacco: Never Used  Substance Use Topics   Alcohol use: Yes    Comment: used to drink five 40 ounce bottles of malt liquor a day; sober May 2017    FAMILY HISTORY:   Family History  Problem Relation Age of Onset   Diabetes type II Father    Diabetes Father    Arthritis Mother     DRUG ALLERGIES:  No Known Allergies  REVIEW OF SYSTEMS:   ROS As per history of present illness. All pertinent systems were reviewed above. Constitutional,  HEENT, cardiovascular, respiratory, GI, GU, musculoskeletal, neuro, psychiatric, endocrine,  integumentary and hematologic systems were reviewed and are otherwise  negative/unremarkable except for positive findings mentioned above in the HPI.   MEDICATIONS AT HOME:   Prior to Admission medications   Medication Sig Start Date End Date Taking? Authorizing Provider  diltiazem (CARDIZEM CD) 180 MG 24 hr capsule Take 1 capsule (180 mg total) by mouth daily. Patient not taking: Reported on 08/06/2019 12/25/18   Saundra Shelling, MD  feeding supplement, ENSURE ENLIVE, (ENSURE ENLIVE) LIQD Take  237 mLs by mouth 3 (three) times daily between meals. Patient not taking: Reported on 08/06/2019 12/25/18   Ihor AustinPyreddy, Pavan, MD  folic acid (FOLVITE) 1 MG tablet Take 1 tablet (1 mg total) by mouth daily. Patient not taking: Reported on 08/06/2019 12/25/18   Ihor AustinPyreddy, Pavan, MD  thiamine 100 MG tablet Take 1 tablet (100 mg total) by mouth daily. Patient not taking: Reported on 08/06/2019 12/25/18   Ihor AustinPyreddy, Pavan, MD      VITAL SIGNS:  Blood pressure (!) 83/63, pulse 94, resp. rate 19, height 5\' 9"  (1.753 m), weight 58 kg, SpO2 100 %.  PHYSICAL EXAMINATION:  Physical Exam  GENERAL:  53 y.o.-year-old Caucasian male patient  lying in the bed with no acute distress.  He was tremulous incoherent and lethargic. EYES: Pupils equal, round, reactive to light and accommodation. No scleral icterus. Extraocular muscles intact.  HEENT: Head atraumatic, normocephalic. Oropharynx and nasopharynx clear.  NECK:  Supple, no jugular venous distention. No thyroid enlargement, no tenderness.  LUNGS: Normal breath sounds bilaterally, no wheezing, rales,rhonchi or crepitation. No use of accessory muscles of respiration.  CARDIOVASCULAR: Regular rate and rhythm with mild tachycardia, S1, S2 normal. No murmurs, rubs, or gallops.  ABDOMEN: Soft, nondistended, nontender. Bowel sounds present. No organomegaly or mass.  EXTREMITIES: No pedal edema, cyanosis, or clubbing.  NEUROLOGIC: No lateralizing signs.  He was not following commands. PSYCHIATRIC: The patient is globally confused SKIN: No obvious rash, lesion, or ulcer.   LABORATORY PANEL:   CBC Recent Labs  Lab 08/06/19 2134  WBC 7.4  HGB 13.0  HCT 37.4*  PLT 157   ------------------------------------------------------------------------------------------------------------------  Chemistries  Recent Labs  Lab 08/06/19 2134  NA 125*  K 4.9  CL 85*  CO2 12*  GLUCOSE 115*  BUN 5*  CREATININE 1.00  CALCIUM 9.2  AST 187*  ALT 60*  ALKPHOS 106  BILITOT 2.8*   ------------------------------------------------------------------------------------------------------------------  Cardiac Enzymes No results for input(s): TROPONINI in the last 168 hours. ------------------------------------------------------------------------------------------------------------------  RADIOLOGY:  Dg Chest Portable 1 View  Result Date: 08/06/2019 CLINICAL DATA:  Shortness of breath EXAM: PORTABLE CHEST 1 VIEW COMPARISON:  Chest radiograph dated 09/15/2015 FINDINGS: The heart size and mediastinal contours are within normal limits. Both lungs are clear. The visualized skeletal structures are  unremarkable. IMPRESSION: No active disease. Electronically Signed   By: Romona Curlsyler  Litton M.D.   On: 08/06/2019 22:03      IMPRESSION AND PLAN:   1.  Delirium tremens.  The patient will be admitted to a stepdown unit.  We will continue him on CIWA protocol and as needed IV Ativan.  He will be hydrated with IV normal saline given his significant hyponatremia.  We will add multivitamins, folic acid and thiamine.  Will check magnesium level and replace as needed.  2.  Hyponatremia.  Will obtain hyponatremia work-up and hydrate with IV normal saline.  This is clearly secondary to his alcoholism.  3.  Elevated LFTs.  This likely secondary to mild alcoholic hepatitis.  Will monitor LFTs with hydration.  4.  Alcohol abuse with alcoholic ketoacidosis.  I counseled him for cessation..  Will follow lactic acid level with hydration.  5.  Elevated troponin I.  He could be having a non-STEMI.  Will obtain a 2D echo and a cardiology consultation for further assessment.  We will place him on aspirin, statin, beta-blocker as well as as needed IV morphine sulfate and sublingual nitroglycerin  5.  DVT prophylaxis.  Subcutaneous therapeutic Lovenox pending further cardiac enzymes.  All the records are reviewed and case discussed with ED provider. The plan of care was discussed in details with the patient (and family). I answered all questions. The patient agreed to proceed with the above mentioned plan. Further management will depend upon hospital course.   CODE STATUS: Full code  TOTAL TIME TAKING CARE OF THIS PATIENT: 60 minutes.    Hannah Beat M.D on 08/07/2019 at 5:11 AM  [facility]   From 7 PM-7 AM, contact night-coverage [email or web address]  CC: Primary care physician; Patient, No Pcp Per   Note: This dictation was prepared with Dragon dictation along with smaller phrase technology. Any transcriptional errors that result from this process are unintentional.

## 2019-08-07 NOTE — ED Notes (Addendum)
Report received from [place], [place]. Pt is currently sleeping at this time. ED stretcher locked and in lowest position.Pt in NAD at this time. Pt O2 delivery via non-rebreather mask. Will continue to monitor pt.

## 2019-08-07 NOTE — Progress Notes (Signed)
  PROGRESS NOTE    Brayden Brodhead  YCX:[id number] DOB: Nov 16, 1965 DOA: 08/06/2019  PCP: Patient, No Pcp Per    LOS - 0   Patient admitted early this morning with acute encephalopathy with delirium.  History of alcohol dependence and DT's, seizures during withdrawal.    Patient seen in ED room today, sleeping comfortably but aroused to voice.  No acute distress, appears underweight, malnourished.  Heart RRR, lungs clear bilaterally, abdomen soft non-tender, no peripheral edema.  H&P reviewed in detail and I agree with the assessment and plan as outlined there. In addition:  Hypotension, likely secondary to ativan. MAP's have been maintained well.  Fluid responsive with bolus as needed.  - maintain MAP>65 - continue maintenance fluids  EtOH Withdrawal - started Librium taper  - replete K w/fluid additive - continue CIWA with PRN Snyder, DO [facility] Pager: [phone]  If 7PM-7AM, please contact night-coverage [email or web address] Password [facility] 08/07/2019, 8:57 AM

## 2019-08-07 NOTE — ED Notes (Addendum)
Pt taken off NRB and placed on room air. sats remain WNL. Pt had bowel movement, cleaned sheets changed and new brief placed.  Oriented. Unlabored. Tremors noted. Mitts removed.

## 2019-08-07 NOTE — ED Notes (Signed)
Notified MD of BP, no new orders

## 2019-08-07 NOTE — ED Notes (Signed)
Meal tray in room. Pt does not want to eat at this time.  Remains to wake to voice. NAD.

## 2019-08-07 NOTE — ED Notes (Signed)
Dr Arbutus Ped notified of soft BP. Pt remains to wake to voice and is oriented.

## 2019-08-07 NOTE — ED Notes (Signed)
NS bolus placed on pressure bag

## 2019-08-07 NOTE — ED Notes (Signed)
Condom cath in place for urine collection 

## 2019-08-07 NOTE — Consult Note (Addendum)
[facility] Cardiology  CARDIOLOGY CONSULT NOTE  Patient ID: Kristopher Duran MRN: [id number] DOB/AGE: 10/12/1965 53 y.o.  Admit date: 08/06/2019 Referring Physician Dr. Sidney Ace Primary Physician: None Primary Cardiologist None Reason for Consultation Elevated troponin  HPI:  History is limited based on patient's altered mental status  Kristopher Duran is a 53 y.o. with a past medical history of alcohol abuse, no known cardiac history, who presented to the ED yesterday after being found in a car with altered mental status. In the ER, he had increased work of breathing and was hypoxic to 70s initially. He was placed on BiPAP but patient did not tolerate this, so he was switched to non-rebreather. His workup suggestive of sepsis - lactate elevated to >11, heart of 140s, soft blood pressure to 95/74. Although no fever on rectal temp. Has been given broad spectrum antibiotics.   Review of systems not completed due to AMS  Past Medical History:  Diagnosis Date  . Alcohol dependence in remission [facility])    is at [facility] and sober for 28 days   . ETOH abuse   . Inguinal hernia    Right    Past Surgical History:  Procedure Laterality Date  . MANDIBLE FRACTURE SURGERY      (Not in a hospital admission)  Social History   Socioeconomic History  . Marital status: Legally Separated    Spouse name: Not on file  . Number of children: Not on file  . Years of education: Not on file  . Highest education level: Not on file  Occupational History  . Not on file  Social Needs  . Financial resource strain: Not on file  . Food insecurity    Worry: Not on file    Inability: Not on file  . Transportation needs    Medical: Not on file    Non-medical: Not on file  Tobacco Use  . Smoking status: Never Smoker  . Smokeless tobacco: Never Used  Substance and Sexual Activity  . Alcohol use: Yes    Comment: used to drink five 40 ounce bottles of malt liquor a day; sober May 2017  . Drug use: Not Currently     Types: Marijuana, Cocaine, "Crack" cocaine    Comment: none since 2011  . Sexual activity: Not on file  Lifestyle  . Physical activity    Days per week: Not on file    Minutes per session: Not on file  . Stress: Not on file  Relationships  . Social Herbalist on phone: Not on file    Gets together: Not on file    Attends religious service: Not on file    Active member of club or organization: Not on file    Attends meetings of clubs or organizations: Not on file    Relationship status: Not on file  . Intimate partner violence    Fear of current or ex partner: Not on file    Emotionally abused: Not on file    Physically abused: Not on file    Forced sexual activity: Not on file  Other Topics Concern  . Not on file  Social History Narrative   ** Merged History Encounter **        Family History  Problem Relation Age of Onset  . Diabetes type II Father   . Diabetes Father   . Arthritis Mother      PHYSICAL EXAM  General: Chronically ill appearing. Altered mental status. Not responsive to this provider's questions.  HEENT:  Normocephalic and atramatic Neck:  No JVD.  Lungs: Crackles in anterior breath sounds.  Heart: HRRR. No appreciable murmurs  Extremities: No clubbing, cyanosis or edema.   Neuro: Alert but not responsive to questions  Psych:  Unresponsive to questions  Labs:   Lab Results  Component Value Date   WBC 6.6 08/07/2019   HGB 11.4 (L) 08/07/2019   HCT 30.5 (L) 08/07/2019   MCV 85.0 08/07/2019   PLT 125 (L) 08/07/2019    Recent Labs  Lab 08/07/19 0519  NA 128*  K 3.0*  CL 95*  CO2 21*  BUN 6  CREATININE 0.66  CALCIUM 7.5*  PROT 6.3*  BILITOT 2.4*  ALKPHOS 85  ALT 45*  AST 127*  GLUCOSE 104*   Lab Results  Component Value Date   CKTOTAL 401 (H) 08/06/2019   No results found for: CHOL No results found for: HDL No results found for: LDLCALC No results found for: TRIG No results found for: CHOLHDL No results found for:  LDLDIRECT    Radiology: Dg Chest Portable 1 View  Result Date: 08/06/2019 CLINICAL DATA:  Shortness of breath EXAM: PORTABLE CHEST 1 VIEW COMPARISON:  Chest radiograph dated 09/15/2015 FINDINGS: The heart size and mediastinal contours are within normal limits. Both lungs are clear. The visualized skeletal structures are unremarkable. IMPRESSION: No active disease. Electronically Signed   By: Romona Curls M.D.   On: 08/06/2019 22:03    EKG: Sinus rhythm, rate of 144 BPM, intraventricular conduction delay, no evidence of acute ischemia  ASSESSMENT AND PLAN:  Kristopher Duran is a 53 year old male with a history of alcohol abuse but no known cardiac history, who presented to the ER yesterday with altered mental status after being found in a car. Patient with increased work of breathing, hypoxia, and tachycardia in the ER. Unable to tolerate BiPAP. Has received broad spectrum antibiotics due to sepsis protocol. The patient's work up has been consistent with alcohol withdrawal with delirium tremens. He remains altered, so history is limited. Cardiology consulted given elevated troponin. Suspect troponin elevated may be secondary to demand ischemic from hypoxia and sepsis. Lower suspicion for ACS at ths time.   #Elevated troponin: HS-Troponin trend: 264 > 665 > 649. Source of troponin elevation less likely secondary to primary cardiac etiology. Suspect tachycardia, hypoxia, and sepsis causing demand ischemia. No indication for invasive cardiac diagnostics or interventions at this time. Continue to monitoring with telemetry.  - Continue with treatment plan for delirium tremens  - Follow ECHO results - No indication for further cardiac interventions or diagnostics at this time. Please call call for further questions or concerns  The patient's history and exam findings were discussed with Dr. Gwen Pounds. The plan was made in conjunction with Dr. Gwen Pounds.  Signed: Harrell Gave PA-C 08/07/2019, 9:15  AM  The patient has been interviewed and examined. I agree with assessment and plan above. Arnoldo Hooker MD [facility]

## 2019-08-07 NOTE — Progress Notes (Signed)
*  PRELIMINARY RESULTS* Echocardiogram 2D Echocardiogram has been performed.  Sherrie Sport 08/07/2019, 9:23 AM

## 2019-08-08 DIAGNOSIS — E876 Hypokalemia: Secondary | ICD-10-CM

## 2019-08-08 DIAGNOSIS — I5020 Unspecified systolic (congestive) heart failure: Secondary | ICD-10-CM

## 2019-08-08 DIAGNOSIS — I959 Hypotension, unspecified: Secondary | ICD-10-CM

## 2019-08-08 DIAGNOSIS — E871 Hypo-osmolality and hyponatremia: Secondary | ICD-10-CM

## 2019-08-08 DIAGNOSIS — F10231 Alcohol dependence with withdrawal delirium: Secondary | ICD-10-CM | POA: Diagnosis not present

## 2019-08-08 LAB — CBC WITH DIFFERENTIAL/PLATELET
Abs Immature Granulocytes: 0.01 10*3/uL (ref 0.00–0.07)
Basophils Absolute: 0 10*3/uL (ref 0.0–0.1)
Basophils Relative: 1 %
Eosinophils Absolute: 0 10*3/uL (ref 0.0–0.5)
Eosinophils Relative: 1 %
HCT: 32.2 % — ABNORMAL LOW (ref 39.0–52.0)
Hemoglobin: 11.2 g/dL — ABNORMAL LOW (ref 13.0–17.0)
Immature Granulocytes: 0 %
Lymphocytes Relative: 17 %
Lymphs Abs: 0.8 10*3/uL (ref 0.7–4.0)
MCH: 31.3 pg (ref 26.0–34.0)
MCHC: 34.8 g/dL (ref 30.0–36.0)
MCV: 89.9 fL (ref 80.0–100.0)
Monocytes Absolute: 0.3 10*3/uL (ref 0.1–1.0)
Monocytes Relative: 8 %
Neutro Abs: 3.2 10*3/uL (ref 1.7–7.7)
Neutrophils Relative %: 73 %
Platelets: 131 10*3/uL — ABNORMAL LOW (ref 150–400)
RBC: 3.58 MIL/uL — ABNORMAL LOW (ref 4.22–5.81)
RDW: 12.5 % (ref 11.5–15.5)
WBC: 4.4 10*3/uL (ref 4.0–10.5)
nRBC: 0 % (ref 0.0–0.2)

## 2019-08-08 LAB — GLUCOSE, CAPILLARY
Glucose-Capillary: 101 mg/dL — ABNORMAL HIGH (ref 70–99)
Glucose-Capillary: 121 mg/dL — ABNORMAL HIGH (ref 70–99)
Glucose-Capillary: 127 mg/dL — ABNORMAL HIGH (ref 70–99)
Glucose-Capillary: 120 mg/dL — ABNORMAL HIGH (ref 70–99)
Glucose-Capillary: 110 mg/dL — ABNORMAL HIGH (ref 70–99)
Glucose-Capillary: 132 mg/dL — ABNORMAL HIGH (ref 70–99)

## 2019-08-08 LAB — BASIC METABOLIC PANEL
Anion gap: 8 (ref 5–15)
BUN: <5 mg/dL — ABNORMAL LOW (ref 6–20)
CO2: 23 mmol/L (ref 22–32)
Calcium: 7.6 mg/dL — ABNORMAL LOW (ref 8.9–10.3)
Chloride: 102 mmol/L (ref 98–111)
Creatinine, Ser: 0.6 mg/dL — ABNORMAL LOW (ref 0.61–1.24)
GFR calc Af Amer: >60 mL/min (ref >60)
GFR calc non Af Amer: >60 mL/min (ref >60)
Glucose, Bld: 97 mg/dL (ref 70–99)
Potassium: 3.4 mmol/L — ABNORMAL LOW (ref 3.5–5.1)
Sodium: 133 mmol/L — ABNORMAL LOW (ref 135–145)

## 2019-08-08 LAB — HEPATITIS PANEL, ACUTE
HCV Ab: NONREACTIVE
Hep A IgM: NONREACTIVE
Hep B C IgM: NONREACTIVE
Hepatitis B Surface Ag: NONREACTIVE

## 2019-08-08 LAB — MAGNESIUM: Magnesium: 1.9 mg/dL (ref 1.7–2.4)

## 2019-08-08 LAB — PHOSPHORUS: Phosphorus: 1.4 mg/dL — ABNORMAL LOW (ref 2.5–4.6)

## 2019-08-08 MED ORDER — POTASSIUM CHLORIDE 10 MEQ/100ML IV SOLN
10.0000 meq | INTRAVENOUS | Status: AC
  Administered 2019-08-08 (×4): 10 meq via INTRAVENOUS
  Filled 2019-08-08 (×4): qty 100

## 2019-08-08 MED ORDER — SODIUM CHLORIDE 0.9 % IV BOLUS
500.0000 mL | Freq: Once | INTRAVENOUS | Status: AC
  Administered 2019-08-08: 500 mL via INTRAVENOUS

## 2019-08-08 MED ORDER — ENOXAPARIN SODIUM 40 MG/0.4ML ~~LOC~~ SOLN
40.0000 mg | SUBCUTANEOUS | Status: DC
  Administered 2019-08-09 – 2019-08-12 (×4): 40 mg via SUBCUTANEOUS
  Filled 2019-08-08 (×4): qty 0.4

## 2019-08-08 MED ORDER — LABETALOL HCL 5 MG/ML IV SOLN: 10.0000 mg | INTRAVENOUS | Status: DC | PRN

## 2019-08-08 NOTE — Progress Notes (Signed)
PROGRESS NOTE    Kristopher Duran  XTK:[id number] DOB: [birth year] DOA: 08/06/2019  PCP: Patient, No Pcp Per    LOS - 1   Brief Narrative:  52 y.o. male with a history of alcohol dependence and withdrawal seizures who presented to the emergency room with altered mental status with delirium.  The patient is homeless and was found sleeping in his car, shivering and was cold to touch.  In the ED, he was tachycardic HR 163, 90% on room air, both improved with IV hydration and IV Ativan.  He was significantly restless in the ER.  EKG showed sinus tachycardia HR 144 with Q waves in V1 and V2 and early repolarization in V3.  Labs notable for troponin 264, elevated LFTs were elevated, hyponatremia and hypokalemia, with bicarb 12 on BMP.  He was given Ativan 1 mg IV x5 and given significantly elevated lactic acid more than 11 initially he was empirically treated with IV cefepime and vancomycin.  He was placed on CIWA protocol.  Lactate improved signficantly with hydration.  He was admitted to stepdown unit on CIWA protocol and cardiology consulted given elevated troponin.  Subjective 11/21: Patient awake laying in bed with eyes closed.  More responsive and interactive today.  Per RN, has been awake, talking, eating.  Patient reports he is feeling better.  Denies chest pain, SOB, fever/chills, N/V/D or other acute complaints.  No acute events reported overnight.   Assessment & Plan:   Active Problems:   Delirium tremens (HCC)   EtOH Withdrawal / Delirium Tremens - with history of withdrawal seizures.   - monitor closely in step-down - started Librium taper  - replete K w/fluid additive - continue CIWA with PRN Ativan - PT/OT  Hypotension, likely secondary to ativan. MAP's have been maintained well.  Fluid responsive with bolus as needed.  - maintain MAP>65 - continue maintenance fluids  Hyponatremia - improving - likely beer podomania - monitor BMP  Hypokalemia - repleted - monitor & replete  as needed  Hypophosphatemia - concern for refeeding syndrome in setting of alcoholic.  Repleted. - continue to monitor and replete as needed   Elevated Troponin - present on admission.  No chest pain.  Likely demand ischemia in setting of hypoxia.  No indication for intervention. - cardiology consulted - Echo showed EF 20-25%  DVT prophylaxis: Lovenox     Code Status: Full Code  Family Communication: none at bedside  Disposition Plan:  Pending further improvement.  To be determined, PT eval pending.     Consultants:   Cardiology  Procedures:   Echo 11/20  Antimicrobials:   Vanc/Cefepime in ED   Objective: Vitals:   08/08/19 0500 08/08/19 0600 08/08/19 0700 08/08/19 0800  BP: (!) 74/61 (!) 78/69 (!) 70/58 (!) 79/62  Pulse: 98 77 78 (!) 104  Resp: 19 19 16 20   Temp:    98.2 F (36.8 C)  TempSrc:    Axillary  SpO2: 98% 100% 95% 96%  Weight:      Height:        Intake/Output Summary (Last 24 hours) at 08/08/2019 0846 Last data filed at 08/08/2019 0800 Gross per 24 hour  Intake 1732.01 ml  Output 1575 ml  Net 157.01 ml   Filed Weights   08/06/19 2215  Weight: 58 kg    Examination:  General exam: awake, alert, no acute distress, underweight Respiratory system: clear to auscultation bilaterally, no wheezes, rales or rhonchi, normal respiratory effort. Cardiovascular system: normal S1/S2, RRR, no JVD,  murmurs, rubs, gallops, no pedal edema.   Gastrointestinal system: soft, non-tender, non-distended abdomen Central nervous system: no gross focal neurologic deficits, normal speech Extremities: moves all, no edema, normal tone Psychiatry: normal mood, congruent affect   Data Reviewed: I have personally reviewed following labs and imaging studies  CBC: Recent Labs  Lab 08/06/19 2134 08/07/19 0519 08/08/19 0437  WBC 7.4 6.6 4.4  NEUTROABS  --   --  3.2  HGB 13.0 11.4* 11.2*  HCT 37.4* 30.5* 32.2*  MCV 91.0 85.0 89.9  PLT 157 125* 131*   Basic  Metabolic Panel: Recent Labs  Lab 08/06/19 2134 08/07/19 0519 08/08/19 0437  NA 125* 128* 133*  K 4.9 3.0* 3.4*  CL 85* 95* 102  CO2 12* 21* 23  GLUCOSE 115* 104* 97  BUN 5* 6 <5*  CREATININE 1.00 0.66 0.60*  CALCIUM 9.2 7.5* 7.6*  MG  --  1.5* 1.9   GFR: Estimated Creatinine Clearance: 86.6 mL/min (A) (by C-G formula based on SCr of 0.6 mg/dL (L)). Liver Function Tests: Recent Labs  Lab 08/06/19 2134 08/07/19 0519  AST 187* 127*  ALT 60* 45*  ALKPHOS 106 85  BILITOT 2.8* 2.4*  PROT 8.3* 6.3*  ALBUMIN 4.2 3.3*   No results for input(s): LIPASE, AMYLASE in the last 168 hours. No results for input(s): AMMONIA in the last 168 hours. Coagulation Profile: Recent Labs  Lab 08/07/19 0430  INR 1.3*   Cardiac Enzymes: Recent Labs  Lab 08/06/19 2325  CKTOTAL 401*   BNP (last 3 results) No results for input(s): PROBNP in the last 8760 hours. HbA1C: No results for input(s): HGBA1C in the last 72 hours. CBG: Recent Labs  Lab 08/07/19 1938 08/07/19 2326 08/08/19 0313 08/08/19 0718  GLUCAP 85 95 101* 121*   Lipid Profile: No results for input(s): CHOL, HDL, LDLCALC, TRIG, CHOLHDL, LDLDIRECT in the last 72 hours. Thyroid Function Tests: Recent Labs    08/07/19 0430  TSH 5.419*   Anemia Panel: No results for input(s): VITAMINB12, FOLATE, FERRITIN, TIBC, IRON, RETICCTPCT in the last 72 hours. Sepsis Labs: Recent Labs  Lab 08/06/19 2134 08/06/19 2325  LATICACIDVEN >11.0* 5.6*    Recent Results (from the past 240 hour(s))  Blood culture (routine x 2)     Status: None (Preliminary result)   Collection Time: 08/06/19  9:48 PM   Specimen: BLOOD  Result Value Ref Range Status   Specimen Description BLOOD BLOOD RIGHT FOREARM  Final   Special Requests   Final    BOTTLES DRAWN AEROBIC AND ANAEROBIC Blood Culture adequate volume   Culture   Final    NO GROWTH 2 DAYS Performed at [facility], [address], [place], [place] [address]    Report  Status PENDING  Incomplete  Blood culture (routine x 2)     Status: None (Preliminary result)   Collection Time: 08/06/19  9:48 PM   Specimen: BLOOD  Result Value Ref Range Status   Specimen Description BLOOD LEFT ANTECUBITAL  Final   Special Requests   Final    BOTTLES DRAWN AEROBIC AND ANAEROBIC Blood Culture adequate volume   Culture   Final    NO GROWTH 2 DAYS Performed at [facility], [address], [place], [place] [address]    Report Status PENDING  Incomplete  SARS CORONAVIRUS 2 (TAT 6-24 HRS) Nasopharyngeal Nasopharyngeal Swab     Status: None   Collection Time: 08/07/19 12:09 AM   Specimen: Nasopharyngeal Swab  Result Value Ref  Range Status   SARS Coronavirus 2 NEGATIVE NEGATIVE Final    Comment: (NOTE) SARS-CoV-2 target nucleic acids are NOT DETECTED. The SARS-CoV-2 RNA is generally detectable in upper and lower respiratory specimens during the acute phase of infection. Negative results do not preclude SARS-CoV-2 infection, do not rule out co-infections with other pathogens, and should not be used as the sole basis for treatment or other patient management decisions. Negative results must be combined with clinical observations, patient history, and epidemiological information. The expected result is Negative. Fact Sheet for Patients: HairSlick.nohttps://www.fda.gov/media/138098/download Fact Sheet for Healthcare Providers: quierodirigir.comhttps://www.fda.gov/media/138095/download This test is not yet approved or cleared by the [place] FDA and  has been authorized for detection and/or diagnosis of SARS-CoV-2 by FDA under an Emergency Use Authorization (EUA). This EUA will remain  in effect (meaning this test can be used) for the duration of the COVID-19 declaration under Section 56 4(b)(1) of the Act, 21 U.S.C. section 360bbb-3(b)(1), unless the authorization is terminated or revoked sooner. Performed at [facility], [address] [place], [place] [address]   MRSA  PCR Screening     Status: None   Collection Time: 08/07/19  6:04 PM   Specimen: Nasal Mucosa; Nasopharyngeal  Result Value Ref Range Status   MRSA by PCR NEGATIVE NEGATIVE Final    Comment:        The GeneXpert MRSA Assay (FDA approved for NASAL specimens only), is one component of a comprehensive MRSA colonization surveillance program. It is not intended to diagnose MRSA infection nor to guide or monitor treatment for MRSA infections. Performed at [facility], [address] [place], [place] [address]          Radiology Studies: Dg Chest Portable 1 View  Result Date: 08/06/2019 CLINICAL DATA:  Shortness of breath EXAM: PORTABLE CHEST 1 VIEW COMPARISON:  Chest radiograph dated 09/15/2015 FINDINGS: The heart size and mediastinal contours are within normal limits. Both lungs are clear. The visualized skeletal structures are unremarkable. IMPRESSION: No active disease. Electronically Signed   By: Romona Curlsyler  Litton M.D.   On: 08/06/2019 22:03        Scheduled Meds: . aspirin EC  325 mg Oral Daily  . atorvastatin  40 mg Oral q1800  . chlordiazePOXIDE  25 mg Oral QID   Followed by  . chlordiazePOXIDE  25 mg Oral TID   Followed by  . [START ON 08/09/2019] chlordiazePOXIDE  25 mg Oral BH-qamhs   Followed by  . [START ON 08/10/2019] chlordiazePOXIDE  25 mg Oral Daily  . Chlorhexidine Gluconate Cloth  6 each Topical Q0600  . diltiazem  180 mg Oral Daily  . enoxaparin (LOVENOX) injection  40 mg Subcutaneous Q24H  . folic acid  1 mg Oral Daily  . LORazepam  0-4 mg Intravenous Q6H   Or  . LORazepam  0-4 mg Oral Q6H  . [START ON 08/09/2019] LORazepam  0-4 mg Intravenous Q12H   Or  . [START ON 08/09/2019] LORazepam  0-4 mg Oral Q12H  . metoprolol tartrate  25 mg Oral BID  . multivitamin with minerals  1 tablet Oral Daily  . thiamine  100 mg Oral Daily   Or  . thiamine  100 mg Intravenous Daily   Continuous Infusions: . dextrose 5 % and 0.9 % NaCl with KCl 40  mEq/L 100 mL/hr at 08/08/19 0532  . potassium chloride       LOS: 1 day    Time spent: 30-35 minutes    Tresa EndoKelly  Iantha Fallen, DO [facility] Pager: [phone]  If 7PM-7AM, please contact night-coverage [email or web address] Password [facility] 08/08/2019, 8:46 AM

## 2019-08-08 NOTE — Plan of Care (Signed)

## 2019-08-08 NOTE — Progress Notes (Signed)
Shift summary:  - Persistent hypotension, asymptomatic.VS otherwise stable.

## 2019-08-08 NOTE — Progress Notes (Signed)
PHARMACIST - PHYSICIAN COMMUNICATION  CONCERNING: IV to Oral Route Change Policy  RECOMMENDATION: This patient is receiving thiamine by the intravenous route.  Based on criteria approved by the Pharmacy and Therapeutics Committee, the intravenous medication(s) is/are being converted to the equivalent oral dose form(s).   DESCRIPTION: These criteria include:  The patient is eating (either orally or via tube) and/or has been taking other orally administered medications for a least 24 hours  The patient has no evidence of active gastrointestinal bleeding or impaired GI absorption (gastrectomy, short bowel, patient on TNA or NPO).  If you have questions about this conversion, please contact the Pharmacy Department at [phone].   Kristopher Duran L, [facility] 08/08/2019 10:09 AM

## 2019-08-08 NOTE — Evaluation (Signed)
Physical Therapy Evaluation Patient Details Name: Kristopher Duran MRN: [id number] DOB: 1965/12/13 Today's Date: 08/08/2019   History of Present Illness  53 yo male with onset of hypotension and AMS with delirium was sent to hosp, was cold in car and asleep. Covid negative.  SOB and tachypneic, has limited history.  PMHx:  EtOH abuse, mandible fracture, inguinal hernia, seizures  Clinical Impression  Pt was seen for mobility and noted his controlled sitting balance, but being very unsafe once up to stand.  Pt is unaware of his physical limits, and was perseverating on having permission to get up and walk alone.  Follow up with all ex's and balance training to reduce fall risk going into rehab setting.  Has a need to consider being in an ALF coming out of rehab.    Follow Up Recommendations SNF    Equipment Recommendations  None recommended by PT    Recommendations for Other Services       Precautions / Restrictions Precautions Precautions: Fall Precaution Comments: AMS, overly confident Restrictions Weight Bearing Restrictions: No      Mobility  Bed Mobility Overal bed mobility: Needs Assistance Bed Mobility: Supine to Sit;Sit to Supine     Supine to sit: Min assist Sit to supine: Min assist   General bed mobility comments: pt is expressing concern that he can move alone, asking for staff to let him get up alone  Transfers Overall transfer level: Needs assistance Equipment used: Rolling walker (2 wheeled);1 person hand held assist Transfers: Sit to/from Stand Sit to Stand: Min assist         General transfer comment: min assist but is complicated by lines and pt trying to walk alone  Ambulation/Gait Ambulation/Gait assistance: Min assist Gait Distance (Feet): 30 Feet Assistive device: Rolling walker (2 wheeled);1 person hand held assist Gait Pattern/deviations: Step-to pattern;Step-through pattern;Shuffle;Decreased stride length;Drifts right/left;Trunk flexed Gait  velocity: reduced, variable   General Gait Details: pt is not aware of his lines, asks to get phone just to walk in room when the device is across the room.  Stairs            Wheelchair Mobility    Modified Rankin (Stroke Patients Only)       Balance Overall balance assessment: History of Falls;Needs assistance Sitting-balance support: Feet supported;Single extremity supported Sitting balance-Leahy Scale: Fair     Standing balance support: Bilateral upper extremity supported;During functional activity Standing balance-Leahy Scale: Poor Standing balance comment: pt lists in sideways directions and does not  have protective extension                             Pertinent Vitals/Pain Pain Assessment: No/denies pain    Home Living Family/patient expects to be discharged to:: Skilled nursing facility Living Arrangements: Alone Available Help at Discharge: Family;Friend(s);Available PRN/intermittently Type of Home: Apartment           Additional Comments: ptis not able to give a full history of home    Prior Function Level of Independence: Independent         Comments: worked as a Scientist, physiological: Right    Extremity/Trunk Assessment   Upper Extremity Assessment Upper Extremity Assessment: Generalized weakness    Lower Extremity Assessment Lower Extremity Assessment: Generalized weakness    Cervical / Trunk Assessment Cervical / Trunk Assessment: Normal  Communication   Communication: No difficulties  Cognition Arousal/Alertness: Awake/alert Behavior During  Therapy: Impulsive Overall Cognitive Status: No family/caregiver present to determine baseline cognitive functioning                                 General Comments: pt is impulsive, unsafe and tends to expect the staff to let him move unassisted      General Comments General comments (skin integrity, edema, etc.): pt is up to walk  with help, using RW with poor control of his safety and poor awareness of when LOB occurs.    Exercises     Assessment/Plan    PT Assessment Patient needs continued PT services  PT Problem List Decreased strength;Decreased range of motion;Decreased activity tolerance;Decreased balance;Decreased mobility;Decreased coordination;Decreased cognition;Decreased knowledge of use of DME;Decreased safety awareness;Cardiopulmonary status limiting activity       PT Treatment Interventions DME instruction;Gait training;Functional mobility training;Therapeutic activities;Therapeutic exercise;Balance training;Neuromuscular re-education;Patient/family education    PT Goals (Current goals can be found in the Care Plan section)  Acute Rehab PT Goals Patient Stated Goal: to walk alone to BR PT Goal Formulation: With patient Time For Goal Achievement: 08/22/19 Potential to Achieve Goals: Good    Frequency Min 2X/week   Barriers to discharge Decreased caregiver support home alone    Co-evaluation               AM-PAC PT "6 Clicks" Mobility  Outcome Measure Help needed turning from your back to your side while in a flat bed without using bedrails?: A Little Help needed moving from lying on your back to sitting on the side of a flat bed without using bedrails?: A Little Help needed moving to and from a bed to a chair (including a wheelchair)?: A Little Help needed standing up from a chair using your arms (e.g., wheelchair or bedside chair)?: A Little Help needed to walk in hospital room?: A Lot Help needed climbing 3-5 steps with a railing? : Total 6 Click Score: 15    End of Session Equipment Utilized During Treatment: Gait belt Activity Tolerance: Patient limited by fatigue;Treatment limited secondary to medical complications (Comment) Patient left: in bed;with call bell/phone within reach;with bed alarm set Nurse Communication: Mobility status PT Visit Diagnosis: Unsteadiness on feet  (R26.81);Muscle weakness (generalized) (M62.81);History of falling (Z91.81);Adult, failure to thrive (R62.7)    Time: 6948-5462 PT Time Calculation (min) (ACUTE ONLY): 26 min   Charges:   PT Evaluation $PT Eval Moderate Complexity: 1 Mod PT Treatments $Gait Training: 8-22 mins       Ramond Dial 08/08/2019, 8:51 PM   Mee Hives, PT MS Acute Rehab Dept. Number: [place]

## 2019-08-09 DIAGNOSIS — I5020 Unspecified systolic (congestive) heart failure: Secondary | ICD-10-CM

## 2019-08-09 DIAGNOSIS — I959 Hypotension, unspecified: Secondary | ICD-10-CM | POA: Diagnosis not present

## 2019-08-09 DIAGNOSIS — F10231 Alcohol dependence with withdrawal delirium: Secondary | ICD-10-CM | POA: Diagnosis not present

## 2019-08-09 LAB — COMPREHENSIVE METABOLIC PANEL
ALT: 36 U/L (ref 0–44)
AST: 117 U/L — ABNORMAL HIGH (ref 15–41)
Albumin: 2.7 g/dL — ABNORMAL LOW (ref 3.5–5.0)
Alkaline Phosphatase: 80 U/L (ref 38–126)
Anion gap: 7 (ref 5–15)
BUN: <5 mg/dL — ABNORMAL LOW (ref 6–20)
CO2: 22 mmol/L (ref 22–32)
Calcium: 7.7 mg/dL — ABNORMAL LOW (ref 8.9–10.3)
Chloride: 104 mmol/L (ref 98–111)
Creatinine, Ser: 0.49 mg/dL — ABNORMAL LOW (ref 0.61–1.24)
GFR calc Af Amer: >60 mL/min (ref >60)
GFR calc non Af Amer: >60 mL/min (ref >60)
Glucose, Bld: 99 mg/dL (ref 70–99)
Potassium: 3.9 mmol/L (ref 3.5–5.1)
Sodium: 133 mmol/L — ABNORMAL LOW (ref 135–145)
Total Bilirubin: 0.7 mg/dL (ref 0.3–1.2)
Total Protein: 5.3 g/dL — ABNORMAL LOW (ref 6.5–8.1)

## 2019-08-09 LAB — CBC WITH DIFFERENTIAL/PLATELET
Abs Immature Granulocytes: 0.01 10*3/uL (ref 0.00–0.07)
Basophils Absolute: 0 10*3/uL (ref 0.0–0.1)
Basophils Relative: 0 %
Eosinophils Absolute: 0 10*3/uL (ref 0.0–0.5)
Eosinophils Relative: 1 %
HCT: 27.2 % — ABNORMAL LOW (ref 39.0–52.0)
Hemoglobin: 9.8 g/dL — ABNORMAL LOW (ref 13.0–17.0)
Immature Granulocytes: 0 %
Lymphocytes Relative: 21 %
Lymphs Abs: 0.8 10*3/uL (ref 0.7–4.0)
MCH: 31.4 pg (ref 26.0–34.0)
MCHC: 36 g/dL (ref 30.0–36.0)
MCV: 87.2 fL (ref 80.0–100.0)
Monocytes Absolute: 0.4 10*3/uL (ref 0.1–1.0)
Monocytes Relative: 10 %
Neutro Abs: 2.7 10*3/uL (ref 1.7–7.7)
Neutrophils Relative %: 68 %
Platelets: 119 10*3/uL — ABNORMAL LOW (ref 150–400)
RBC: 3.12 MIL/uL — ABNORMAL LOW (ref 4.22–5.81)
RDW: 13.2 % (ref 11.5–15.5)
WBC: 3.9 10*3/uL — ABNORMAL LOW (ref 4.0–10.5)
nRBC: 0 % (ref 0.0–0.2)

## 2019-08-09 LAB — GLUCOSE, CAPILLARY
Glucose-Capillary: 95 mg/dL (ref 70–99)
Glucose-Capillary: 104 mg/dL — ABNORMAL HIGH (ref 70–99)
Glucose-Capillary: 100 mg/dL — ABNORMAL HIGH (ref 70–99)
Glucose-Capillary: 134 mg/dL — ABNORMAL HIGH (ref 70–99)

## 2019-08-09 LAB — T4, FREE: Free T4: 1.08 ng/dL (ref 0.61–1.12)

## 2019-08-09 LAB — MAGNESIUM: Magnesium: 1.7 mg/dL (ref 1.7–2.4)

## 2019-08-09 LAB — PHOSPHORUS: Phosphorus: 1.3 mg/dL — ABNORMAL LOW (ref 2.5–4.6)

## 2019-08-09 MED ORDER — SODIUM PHOSPHATES 45 MMOLE/15ML IV SOLN
30.0000 mmol | Freq: Once | INTRAVENOUS | Status: AC
  Administered 2019-08-09: 30 mmol via INTRAVENOUS
  Filled 2019-08-09: qty 10

## 2019-08-09 MED ORDER — DEXMEDETOMIDINE HCL IN NACL 400 MCG/100ML IV SOLN
0.0000 ug/kg/h | INTRAVENOUS | Status: DC
  Administered 2019-08-09 – 2019-08-10 (×2): 0.2 ug/kg/h via INTRAVENOUS
  Filled 2019-08-09: qty 100

## 2019-08-09 MED ORDER — KCL IN DEXTROSE-NACL 40-5-0.45 MEQ/L-%-% IV SOLN
INTRAVENOUS | Status: DC
  Administered 2019-08-09: 10:00:00 via INTRAVENOUS
  Filled 2019-08-09 (×3): qty 1000

## 2019-08-09 MED ORDER — KCL IN DEXTROSE-NACL 40-5-0.9 MEQ/L-%-% IV SOLN
INTRAVENOUS | Status: DC
  Administered 2019-08-09 – 2019-08-10 (×3): via INTRAVENOUS
  Filled 2019-08-09 (×5): qty 1000

## 2019-08-09 NOTE — Progress Notes (Signed)
[facility] Encounter Note  Patient: Kristopher Duran / Admit Date: 08/06/2019 / Date of Encounter: 08/09/2019, 5:27 PM   Subjective: Patient is improved from admission with no evidence of significant symptoms chest pain or shortness of breath or congestive heart failure this evening.  Patient has been ambulating to the chair without issues Echocardiogram showing severe LV systolic dysfunction with ejection fraction of 25% and mild valvular heart disease  Review of Systems: Positive for: Shortness of breath Negative for: Vision change, hearing change, syncope, dizziness, nausea, vomiting,diarrhea, bloody stool, stomach pain, cough, congestion, diaphoresis, urinary frequency, urinary pain,skin lesions, skin rashes Others previously listed  Objective: Telemetry: Normal sinus rhythm Physical Exam: Blood pressure 111/82, pulse 75, temperature 98.6 F (37 C), temperature source Oral, resp. rate (!) 22, height 5\' 9"  (1.753 m), weight 58 kg, SpO2 98 %. Body mass index is 18.88 kg/m. General: Well developed, well nourished, in no acute distress. Head: Normocephalic, atraumatic, sclera non-icteric, no xanthomas, nares are without discharge. Neck: No apparent masses Lungs: Normal respirations with no wheezes, no rhonchi, no rales , no crackles   Heart: Regular rate and rhythm, normal S1 S2, no murmur, no rub, no gallop, PMI is normal size and placement, carotid upstroke normal without bruit, jugular venous pressure normal Abdomen: Soft, non-tender, non-distended with normoactive bowel sounds. No hepatosplenomegaly. Abdominal aorta is normal size without bruit Extremities: No edema, no clubbing, no cyanosis, no ulcers,  Peripheral: 2+ radial, 2+ femoral, 2+ dorsal pedal pulses Neuro: Alert and oriented. Moves all extremities spontaneously. Psych:  Responds to questions appropriately with a normal affect.   Intake/Output Summary (Last 24 hours) at 08/09/2019 1727 Last data  filed at 08/09/2019 0951 Gross per 24 hour  Intake 2186.7 ml  Output 850 ml  Net 1336.7 ml    Inpatient Medications:  . aspirin EC  325 mg Oral Daily  . atorvastatin  40 mg Oral q1800  . chlordiazePOXIDE  25 mg Oral BH-qamhs   Followed by  . [START ON 08/10/2019] chlordiazePOXIDE  25 mg Oral Daily  . Chlorhexidine Gluconate Cloth  6 each Topical Q0600  . enoxaparin (LOVENOX) injection  40 mg Subcutaneous Q24H  . folic acid  1 mg Oral Daily  . LORazepam  0-4 mg Intravenous Q12H   Or  . LORazepam  0-4 mg Oral Q12H  . metoprolol tartrate  25 mg Oral BID  . multivitamin with minerals  1 tablet Oral Daily  . thiamine  100 mg Oral Daily   Infusions:  . dextrose 5 % and 0.9 % NaCl with KCl 40 mEq/L      Labs: Recent Labs    08/08/19 0437 08/09/19 0358  NA 133* 133*  K 3.4* 3.9  CL 102 104  CO2 23 22  GLUCOSE 97 99  BUN <5* <5*  CREATININE 0.60* 0.49*  CALCIUM 7.6* 7.7*  MG 1.9 1.7  PHOS 1.4* 1.3*   Recent Labs    08/07/19 0519 08/09/19 0358  AST 127* 117*  ALT 45* 36  ALKPHOS 85 80  BILITOT 2.4* 0.7  PROT 6.3* 5.3*  ALBUMIN 3.3* 2.7*   Recent Labs    08/08/19 0437 08/09/19 0358  WBC 4.4 3.9*  NEUTROABS 3.2 2.7  HGB 11.2* 9.8*  HCT 32.2* 27.2*  MCV 89.9 87.2  PLT 131* 119*   Recent Labs    08/06/19 2325  CKTOTAL 401*   Invalid input(s): POCBNP No results for input(s): HGBA1C in the last 72 hours.   Weights: [employer]  08/06/19 2215  Weight: 58 kg     Radiology/Studies:  Dg Chest Portable 1 View  Result Date: 08/06/2019 CLINICAL DATA:  Shortness of breath EXAM: PORTABLE CHEST 1 VIEW COMPARISON:  Chest radiograph dated 09/15/2015 FINDINGS: The heart size and mediastinal contours are within normal limits. Both lungs are clear. The visualized skeletal structures are unremarkable. IMPRESSION: No active disease. Electronically Signed   By: Romona Curls M.D.   On: 08/06/2019 22:03     Assessment and Recommendation  53 y.o. male with acute  unresponsiveness who has now recovered and has an echocardiogram showing a severe LV systolic dysfunction needing further medication management 1.  Continue low-dose beta-blocker for cardiomyopathy as long as patient is tolerating treatment 2.  Continue recovery and ambulation and follow for improvements in further adjustments of medications as we see during recovery  Signed, Arnoldo Hooker M.D. FACC

## 2019-08-09 NOTE — Progress Notes (Addendum)
PROGRESS NOTE    Melton Walls  PYK:[id number] DOB: 1965-09-24 DOA: 08/06/2019  PCP: Patient, No Pcp Per    LOS - 2   Brief Narrative:  53 y.o.malewith a history ofalcohol dependence and withdrawal seizures who presented to the emergency room with altered mental status with delirium. The patient is homeless and was found sleeping in his car, shivering and was cold to touch. In the ED, he was tachycardic HR 163, 90% on room air, both improved with IV hydration and IV Ativan. He was significantly restless in the ER. EKG showed sinus tachycardia HR 144 with Q waves in V1 and V2 and early repolarization in V3. Labs notable for troponin 264, elevated LFTs were elevated, hyponatremia and hypokalemia, with bicarb 12 on BMP. He was given Ativan 1 mg IV x5 and given significantly elevated lactic acid more than 11 initially he was empirically treated with IV cefepime and vancomycin. He was placed on CIWA protocol. Lactate improved signficantly with hydration.  He was admitted to stepdown unit on CIWA protocol and cardiology consulted given elevated troponin.  Subjective 11/22: Patient awake, sitting up in bed, empty breakfast tray.  Looks well today.  States he is feeling much better.  Denies fever/chills, cough, chest pain, SOB, N/V/D or other acute complaints.    Assessment & Plan:   Active Problems:   Hypotension   Delirium tremens (HCC)   Hypokalemia   Hyponatremia   Hypophosphatemia   HFrEF (heart failure with reduced ejection fraction) (HCC)   EtOH Withdrawal / Delirium Tremens - with history of withdrawal seizures.  Improving - monitor closely in step-down - continue Librium taper  - continue CIWA with PRN Ativan - thiamine PO 539 mg daily - folic acid 1mg  PO daily - PT/OT - Transfer to med/surg.  D/C telemetry.  Hypotension, likely secondary to ativan. MAP's have been maintained well. Fluid responsive with bolus as needed.  - maintain MAP>65 - continue maintenance  fluids  Hyponatremia - improving with fluids - likely beer podomania - monitor BMP  Hypokalemia - repleted - monitor & replete as needed  Hypophosphatemia - concern for refeeding syndrome in setting of chronic alcoholism.  Repleting IV today. - continue to monitor and replete as needed   HFrEF - Echo showed EF 20-25%.  Suspect dilated CM secondary to chronic alcohol use.   - has been hypotensive, hold off on ACEI, beta blocker for now - no volume overloaded at this time - advise complete alcohol cessation - cardiology consulted  Elevated Troponin - present on admission.  No chest pain.  Likely demand ischemia in setting of hypoxia.  No indication for intervention. - cardiology consulted - Echo showed EF 20-25%  DVT prophylaxis: Lovenox                  Code Status: Full Code  Family Communication: none at bedside  Disposition Plan:  PT recommends SNF.  D/C pending further improvement, mainly electrolytes   Consultants:   Cardiology  Procedures:   Echo 11/20  Antimicrobials:   Vanc/Cefepime in ED   Objective: Vitals:   08/09/19 0300 08/09/19 0400 08/09/19 0500 08/09/19 0600  BP: 91/64 96/73 111/88 111/83  Pulse: 76 84 (!) 110 (!) 115  Resp: 17 16 12  (!) 22  Temp:  98.4 F (36.9 C)    TempSrc:  Oral    SpO2: 100% 98% 100% 100%  Weight:      Height:        Intake/Output Summary (Last 24 hours) at  08/09/2019 0821 Last data filed at 08/09/2019 0600 Gross per 24 hour  Intake 2378.61 ml  Output 1250 ml  Net 1128.61 ml   Filed Weights   08/06/19 2215  Weight: 58 kg    Examination:  General exam: awake, alert, no acute distress, underweight HEENT: moist mucus membranes, hearing grossly normal  Respiratory system: clear to auscultation bilaterally, no wheezes, rales or rhonchi, normal respiratory effort. Cardiovascular system: normal S1/S2, RRR, no JVD, murmurs, rubs, gallops, no pedal edema.   Gastrointestinal system: soft, non-tender,  non-distended abdomen, normal bowel sounds. Central nervous system: alert and oriented x4. no gross focal neurologic deficits, normal speech Extremities: moves all, no edema, normal tone Psychiatry: normal mood, congruent affect, judgement and insight appear normal    Data Reviewed: I have personally reviewed following labs and imaging studies  CBC: Recent Labs  Lab 08/06/19 2134 08/07/19 0519 08/08/19 0437 08/09/19 0358  WBC 7.4 6.6 4.4 3.9*  NEUTROABS  --   --  3.2 2.7  HGB 13.0 11.4* 11.2* 9.8*  HCT 37.4* 30.5* 32.2* 27.2*  MCV 91.0 85.0 89.9 87.2  PLT 157 125* 131* 119*   Basic Metabolic Panel: Recent Labs  Lab 08/06/19 2134 08/07/19 0519 08/08/19 0437 08/09/19 0358  NA 125* 128* 133* 133*  K 4.9 3.0* 3.4* 3.9  CL 85* 95* 102 104  CO2 12* 21* 23 22  GLUCOSE 115* 104* 97 99  BUN 5* 6 <5* <5*  CREATININE 1.00 0.66 0.60* 0.49*  CALCIUM 9.2 7.5* 7.6* 7.7*  MG  --  1.5* 1.9 1.7  PHOS  --   --  1.4* 1.3*   GFR: Estimated Creatinine Clearance: 86.6 mL/min (A) (by C-G formula based on SCr of 0.49 mg/dL (L)). Liver Function Tests: Recent Labs  Lab 08/06/19 2134 08/07/19 0519 08/09/19 0358  AST 187* 127* 117*  ALT 60* 45* 36  ALKPHOS 106 85 80  BILITOT 2.8* 2.4* 0.7  PROT 8.3* 6.3* 5.3*  ALBUMIN 4.2 3.3* 2.7*   No results for input(s): LIPASE, AMYLASE in the last 168 hours. No results for input(s): AMMONIA in the last 168 hours. Coagulation Profile: Recent Labs  Lab 08/07/19 0430  INR 1.3*   Cardiac Enzymes: Recent Labs  Lab 08/06/19 2325  CKTOTAL 401*   BNP (last 3 results) No results for input(s): PROBNP in the last 8760 hours. HbA1C: No results for input(s): HGBA1C in the last 72 hours. CBG: Recent Labs  Lab 08/08/19 1141 08/08/19 1619 08/08/19 2040 08/08/19 2323 08/09/19 0402  GLUCAP 127* 120* 110* 132* 95   Lipid Profile: No results for input(s): CHOL, HDL, LDLCALC, TRIG, CHOLHDL, LDLDIRECT in the last 72 hours. Thyroid Function  Tests: Recent Labs    08/07/19 0430  TSH 5.419*   Anemia Panel: No results for input(s): VITAMINB12, FOLATE, FERRITIN, TIBC, IRON, RETICCTPCT in the last 72 hours. Sepsis Labs: Recent Labs  Lab 08/06/19 2134 08/06/19 2325  LATICACIDVEN >11.0* 5.6*    Recent Results (from the past 240 hour(s))  Blood culture (routine x 2)     Status: None (Preliminary result)   Collection Time: 08/06/19  9:48 PM   Specimen: BLOOD  Result Value Ref Range Status   Specimen Description BLOOD BLOOD RIGHT FOREARM  Final   Special Requests   Final    BOTTLES DRAWN AEROBIC AND ANAEROBIC Blood Culture adequate volume   Culture   Final    NO GROWTH 3 DAYS Performed at [facility], [address], [place], [place] [address]  Report Status PENDING  Incomplete  Blood culture (routine x 2)     Status: None (Preliminary result)   Collection Time: 08/06/19  9:48 PM   Specimen: BLOOD  Result Value Ref Range Status   Specimen Description BLOOD LEFT ANTECUBITAL  Final   Special Requests   Final    BOTTLES DRAWN AEROBIC AND ANAEROBIC Blood Culture adequate volume   Culture   Final    NO GROWTH 3 DAYS Performed at [facility], [address] [place], [place] [address]    Report Status PENDING  Incomplete  SARS CORONAVIRUS 2 (TAT 6-24 HRS) Nasopharyngeal Nasopharyngeal Swab     Status: None   Collection Time: 08/07/19 12:09 AM   Specimen: Nasopharyngeal Swab  Result Value Ref Range Status   SARS Coronavirus 2 NEGATIVE NEGATIVE Final    Comment: (NOTE) SARS-CoV-2 target nucleic acids are NOT DETECTED. The SARS-CoV-2 RNA is generally detectable in upper and lower respiratory specimens during the acute phase of infection. Negative results do not preclude SARS-CoV-2 infection, do not rule out co-infections with other pathogens, and should not be used as the sole basis for treatment or other patient management decisions. Negative results must be combined with clinical observations,  patient history, and epidemiological information. The expected result is Negative. Fact Sheet for Patients: HairSlick.nohttps://www.fda.gov/media/138098/download Fact Sheet for Healthcare Providers: quierodirigir.comhttps://www.fda.gov/media/138095/download This test is not yet approved or cleared by the [place] FDA and  has been authorized for detection and/or diagnosis of SARS-CoV-2 by FDA under an Emergency Use Authorization (EUA). This EUA will remain  in effect (meaning this test can be used) for the duration of the COVID-19 declaration under Section 56 4(b)(1) of the Act, 21 U.S.C. section 360bbb-3(b)(1), unless the authorization is terminated or revoked sooner. Performed at [facility], [address] [place], [place] [address]   MRSA PCR Screening     Status: None   Collection Time: 08/07/19  6:04 PM   Specimen: Nasal Mucosa; Nasopharyngeal  Result Value Ref Range Status   MRSA by PCR NEGATIVE NEGATIVE Final    Comment:        The GeneXpert MRSA Assay (FDA approved for NASAL specimens only), is one component of a comprehensive MRSA colonization surveillance program. It is not intended to diagnose MRSA infection nor to guide or monitor treatment for MRSA infections. Performed at [facility], [address] [place], [place] [address]          Radiology Studies: No results found.      Scheduled Meds: . aspirin EC  325 mg Oral Daily  . atorvastatin  40 mg Oral q1800  . chlordiazePOXIDE  25 mg Oral BH-qamhs   Followed by  . [START ON 08/10/2019] chlordiazePOXIDE  25 mg Oral Daily  . Chlorhexidine Gluconate Cloth  6 each Topical Q0600  . diltiazem  180 mg Oral Daily  . enoxaparin (LOVENOX) injection  40 mg Subcutaneous Q24H  . folic acid  1 mg Oral Daily  . LORazepam  0-4 mg Intravenous Q12H   Or  . LORazepam  0-4 mg Oral Q12H  . metoprolol tartrate  25 mg Oral BID  . multivitamin with minerals  1 tablet Oral Daily  . thiamine  100 mg Oral Daily    Continuous Infusions: . dextrose 5 % and 0.9 % NaCl with KCl 40 mEq/L 100 mL/hr at 08/09/19 0427  . sodium phosphate  Dextrose 5% IVPB       LOS: 2 days    Time  spent: 25-30 minutes    Pennie Banter, DO [facility] Pager: [phone]  If 7PM-7AM, please contact night-coverage [email or web address] Password [email or web address] 08/09/2019, 8:21 AM

## 2019-08-09 NOTE — Progress Notes (Signed)
Patient begin displaying detox symptoms. Irrational behavior , fighting with staff, attempting to get out of bed and requesting to leave the hospital while very unsteady and almost fell while standing and voiding on the floor with no bed alarm in place at 1905. At that time 4mg  of ativan IV was administered for CIWA of 23. The behviors did not change, in fact they got worse. Patient pulled out IV and adamantly trying to removed heart monitor and get out of bed while he was not safe to do so. Patient was unable to be redirected. On call provider was on the unit and assessed the patient. Patient placed on precedex. Patient is now resting. Will continue to monitor and endorse.

## 2019-08-10 DIAGNOSIS — F10231 Alcohol dependence with withdrawal delirium: Secondary | ICD-10-CM | POA: Diagnosis not present

## 2019-08-10 DIAGNOSIS — I5020 Unspecified systolic (congestive) heart failure: Secondary | ICD-10-CM | POA: Diagnosis not present

## 2019-08-10 DIAGNOSIS — I959 Hypotension, unspecified: Secondary | ICD-10-CM | POA: Diagnosis not present

## 2019-08-10 DIAGNOSIS — D638 Anemia in other chronic diseases classified elsewhere: Secondary | ICD-10-CM

## 2019-08-10 LAB — BLOOD GAS, VENOUS
Acid-base deficit: 8.8 mmol/L — ABNORMAL HIGH (ref 0.0–2.0)
Bicarbonate: 15.8 mmol/L — ABNORMAL LOW (ref 20.0–28.0)
FIO2: 100
O2 Saturation: 14.5 %
Patient temperature: 37
pCO2, Ven: 30 mmHg — ABNORMAL LOW (ref 44.0–60.0)
pH, Ven: 7.33 (ref 7.250–7.430)
pO2, Ven: <31 mmHg — CL (ref 32.0–45.0)

## 2019-08-10 LAB — COMPREHENSIVE METABOLIC PANEL
ALT: 37 U/L (ref 0–44)
AST: 81 U/L — ABNORMAL HIGH (ref 15–41)
Albumin: 2.6 g/dL — ABNORMAL LOW (ref 3.5–5.0)
Alkaline Phosphatase: 76 U/L (ref 38–126)
Anion gap: 6 (ref 5–15)
BUN: <5 mg/dL — ABNORMAL LOW (ref 6–20)
CO2: 22 mmol/L (ref 22–32)
Calcium: 8.1 mg/dL — ABNORMAL LOW (ref 8.9–10.3)
Chloride: 107 mmol/L (ref 98–111)
Creatinine, Ser: 0.59 mg/dL — ABNORMAL LOW (ref 0.61–1.24)
GFR calc Af Amer: >60 mL/min (ref >60)
GFR calc non Af Amer: >60 mL/min (ref >60)
Glucose, Bld: 105 mg/dL — ABNORMAL HIGH (ref 70–99)
Potassium: 4.1 mmol/L (ref 3.5–5.1)
Sodium: 135 mmol/L (ref 135–145)
Total Bilirubin: 0.7 mg/dL (ref 0.3–1.2)
Total Protein: 5.3 g/dL — ABNORMAL LOW (ref 6.5–8.1)

## 2019-08-10 LAB — CBC
HCT: 28.1 % — ABNORMAL LOW (ref 39.0–52.0)
Hemoglobin: 9.7 g/dL — ABNORMAL LOW (ref 13.0–17.0)
MCH: 32.1 pg (ref 26.0–34.0)
MCHC: 34.5 g/dL (ref 30.0–36.0)
MCV: 93 fL (ref 80.0–100.0)
Platelets: 145 10*3/uL — ABNORMAL LOW (ref 150–400)
RBC: 3.02 MIL/uL — ABNORMAL LOW (ref 4.22–5.81)
RDW: 13.6 % (ref 11.5–15.5)
WBC: 3.6 10*3/uL — ABNORMAL LOW (ref 4.0–10.5)
nRBC: 0 % (ref 0.0–0.2)

## 2019-08-10 LAB — GLUCOSE, CAPILLARY
Glucose-Capillary: 100 mg/dL — ABNORMAL HIGH (ref 70–99)
Glucose-Capillary: 102 mg/dL — ABNORMAL HIGH (ref 70–99)
Glucose-Capillary: 105 mg/dL — ABNORMAL HIGH (ref 70–99)
Glucose-Capillary: 107 mg/dL — ABNORMAL HIGH (ref 70–99)
Glucose-Capillary: 120 mg/dL — ABNORMAL HIGH (ref 70–99)
Glucose-Capillary: 160 mg/dL — ABNORMAL HIGH (ref 70–99)
Glucose-Capillary: 70 mg/dL (ref 70–99)
Glucose-Capillary: 85 mg/dL (ref 70–99)

## 2019-08-10 LAB — IRON AND TIBC
Iron: 32 ug/dL — ABNORMAL LOW (ref 45–182)
Saturation Ratios: 20 % (ref 17.9–39.5)
TIBC: 158 ug/dL — ABNORMAL LOW (ref 250–450)
UIBC: 126 ug/dL

## 2019-08-10 LAB — RETICULOCYTES
Immature Retic Fract: 23.5 % — ABNORMAL HIGH (ref 2.3–15.9)
RBC.: 3.03 MIL/uL — ABNORMAL LOW (ref 4.22–5.81)
Retic Count, Absolute: 65.4 10*3/uL (ref 19.0–186.0)
Retic Ct Pct: 2.2 % (ref 0.4–3.1)

## 2019-08-10 LAB — VITAMIN B12: Vitamin B-12: 557 pg/mL (ref 180–914)

## 2019-08-10 LAB — FERRITIN: Ferritin: 266 ng/mL (ref 24–336)

## 2019-08-10 LAB — MAGNESIUM: Magnesium: 1.8 mg/dL (ref 1.7–2.4)

## 2019-08-10 LAB — PHOSPHORUS: Phosphorus: 2.3 mg/dL — ABNORMAL LOW (ref 2.5–4.6)

## 2019-08-10 LAB — FOLATE: Folate: 14.6 ng/mL

## 2019-08-10 LAB — CARBON MONOXIDE, BLOOD (PERFORMED AT REF LAB): Carbon Monoxide, Blood: 3.9 % — ABNORMAL HIGH (ref 0.0–3.6)

## 2019-08-10 MED ORDER — LORAZEPAM 2 MG/ML IJ SOLN: 0.0000 mg | INTRAMUSCULAR | Status: DC

## 2019-08-10 MED ORDER — SODIUM CHLORIDE 0.9 % IV BOLUS
500.0000 mL | Freq: Once | INTRAVENOUS | Status: AC
  Administered 2019-08-10: 500 mL via INTRAVENOUS

## 2019-08-10 MED ORDER — POTASSIUM CHLORIDE 2 MEQ/ML IV SOLN
INTRAVENOUS | Status: DC
  Administered 2019-08-11: 02:00:00 via INTRAVENOUS
  Filled 2019-08-10 (×4): qty 1000

## 2019-08-10 MED ORDER — SODIUM PHOSPHATES 45 MMOLE/15ML IV SOLN
30.0000 mmol | Freq: Once | INTRAVENOUS | Status: AC
  Administered 2019-08-10: 30 mmol via INTRAVENOUS
  Filled 2019-08-10: qty 10

## 2019-08-10 MED ORDER — LORAZEPAM 2 MG PO TABS: 0.0000 mg | ORAL_TABLET | ORAL | Status: DC

## 2019-08-10 NOTE — Progress Notes (Signed)
[facility] Encounter Note  Patient: Kristopher Duran / Admit Date: 08/06/2019 / Date of Encounter: 08/10/2019, 7:44 AM   Subjective: Patient is improved from admission with no evidence of significant symptoms chest pain or shortness of breath or congestive heart failure overnight..  Patient has been ambulating to the chair without issues and overall electrolyte values improved without evidence of significant changes.  Patient is tolerating low-dose medication management including metoprolol at this time Echocardiogram showing severe LV systolic dysfunction with ejection fraction of 25% and mild valvular heart disease  Review of Systems: Positive for: Shortness of breath Negative for: Vision change, hearing change, syncope, dizziness, nausea, vomiting,diarrhea, bloody stool, stomach pain, cough, congestion, diaphoresis, urinary frequency, urinary pain,skin lesions, skin rashes Others previously listed  Objective: Telemetry: Normal sinus rhythm Physical Exam: Blood pressure 107/78, pulse 64, temperature 98.1 F (36.7 C), temperature source Oral, resp. rate 16, height 5\' 9"  (1.753 m), weight 58 kg, SpO2 96 %. Body mass index is 18.88 kg/m. General: Well developed, well nourished, in no acute distress. Head: Normocephalic, atraumatic, sclera non-icteric, no xanthomas, nares are without discharge. Neck: No apparent masses Lungs: Normal respirations with no wheezes, no rhonchi, no rales , no crackles   Heart: Regular rate and rhythm, normal S1 S2, no murmur, no rub, no gallop, PMI is normal size and placement, carotid upstroke normal without bruit, jugular venous pressure normal Abdomen: Soft, non-tender, non-distended with normoactive bowel sounds. No hepatosplenomegaly. Abdominal aorta is normal size without bruit Extremities: No edema, no clubbing, no cyanosis, no ulcers,  Peripheral: 2+ radial, 2+ femoral, 2+ dorsal pedal pulses Neuro: Alert and oriented. Moves all  extremities spontaneously. Psych:  Responds to questions appropriately with a normal affect.   Intake/Output Summary (Last 24 hours) at 08/10/2019 0744 Last data filed at 08/10/2019 0600 Gross per 24 hour  Intake 2329.77 ml  Output 600 ml  Net 1729.77 ml    Inpatient Medications:  . aspirin EC  325 mg Oral Daily  . atorvastatin  40 mg Oral q1800  . chlordiazePOXIDE  25 mg Oral BH-qamhs   Followed by  . chlordiazePOXIDE  25 mg Oral Daily  . Chlorhexidine Gluconate Cloth  6 each Topical Q0600  . enoxaparin (LOVENOX) injection  40 mg Subcutaneous Q24H  . folic acid  1 mg Oral Daily  . LORazepam  0-4 mg Intravenous Q12H   Or  . LORazepam  0-4 mg Oral Q12H  . metoprolol tartrate  25 mg Oral BID  . multivitamin with minerals  1 tablet Oral Daily  . thiamine  100 mg Oral Daily   Infusions:  . dexmedetomidine (PRECEDEX) IV infusion 0.3 mcg/kg/hr (08/10/19 0600)  . dextrose 5 % and 0.9 % NaCl with KCl 40 mEq/L 100 mL/hr at 08/10/19 0600    Labs: Recent Labs    08/09/19 0358 08/10/19 0654  NA 133* 135  K 3.9 4.1  CL 104 107  CO2 22 22  GLUCOSE 99 105*  BUN <5* <5*  CREATININE 0.49* 0.59*  CALCIUM 7.7* 8.1*  MG 1.7 1.8  PHOS 1.3* 2.3*   Recent Labs    08/09/19 0358 08/10/19 0654  AST 117* 81*  ALT 36 37  ALKPHOS 80 76  BILITOT 0.7 0.7  PROT 5.3* 5.3*  ALBUMIN 2.7* 2.6*   Recent Labs    08/08/19 0437 08/09/19 0358 08/10/19 0654  WBC 4.4 3.9* 3.6*  NEUTROABS 3.2 2.7  --   HGB 11.2* 9.8* 9.7*  HCT 32.2* 27.2* 28.1*  MCV 89.9  87.2 93.0  PLT 131* 119* 145*   No results for input(s): CKTOTAL, CKMB, TROPONINI in the last 72 hours. Invalid input(s): POCBNP No results for input(s): HGBA1C in the last 72 hours.   Weights: Filed Weights   08/06/19 2215  Weight: 58 kg     Radiology/Studies:  Dg Chest Portable 1 View  Result Date: 08/06/2019 CLINICAL DATA:  Shortness of breath EXAM: PORTABLE CHEST 1 VIEW COMPARISON:  Chest radiograph dated 09/15/2015  FINDINGS: The heart size and mediastinal contours are within normal limits. Both lungs are clear. The visualized skeletal structures are unremarkable. IMPRESSION: No active disease. Electronically Signed   By: Romona Curls M.D.   On: 08/06/2019 22:03     Assessment and Recommendation  53 y.o. male with acute unresponsiveness who has now recovered and has an echocardiogram showing a severe LV systolic dysfunction needing further medication management throughout this hospitalization and outpatient as well 1.  Continue low-dose beta-blocker for cardiomyopathy as long as patient is tolerating treatment 2.  Continue recovery and ambulation and follow for improvements in further adjustments of medications as we see during recovery 3.  Patient okay for inpatient rehabilitation as well as transfer to telemetry.  Patient may require home placement  Signed, Arnoldo Hooker M.D. FACC

## 2019-08-10 NOTE — Progress Notes (Addendum)
PROGRESS NOTE    Kristopher Duran  ZOX:[id number] DOB: 1966-03-29 DOA: 08/06/2019  PCP: Patient, No Pcp Per    LOS - 3   Brief Narrative:  53 y.o.malewith a history ofalcohol dependence and withdrawal seizures who presented to the emergency room with altered mental status with delirium. The patient is homeless and was foundsleepingin his car,shivering and was cold to touch.In the ED, he was tachycardicHR163,90% on room air, both improved with IVhydration and IV Ativan. He was significantly restless in the ER. EKG showed sinus tachycardiaHR144 with Q waves in V1 and V2 and early repolarization in V3. Labs notable fortroponin 264, elevatedLFTs were elevated, hyponatremia and hypokalemia, with bicarb 12 on BMP. He was given Ativan 1 mg IV x5 and given significantly elevated lactic acid more than 11 initially he was empirically treated with IV cefepime and vancomycin. He was placed on CIWA protocol.Lactate improved signficantly with hydration. He was admitted to stepdown uniton CIWA protocol and cardiology consulted given elevated troponin.   Subjective 11/23: Overnight, patient apparently became combative, was started on Precedex.  This AM, patient sleeping comfortably, arousable but falls back to sleep.  Denies pain or discomfort.  No other acute events reported.  Assessment & Plan:   Principal Problem:   Delirium tremens (HCC) Active Problems:   Hypotension   Anemia of chronic disease   Hypokalemia   Hyponatremia   Hypophosphatemia   HFrEF (heart failure with reduced ejection fraction) (HCC)   EtOH Withdrawal/ Delirium Tremens- with history of withdrawal seizures. Improving - monitor closely in step-down - currently on Precedex, started overnight - try to wean today  - continue Librium taper - last day - may resume BID, depending on how he does off Precedex, as he may need longer taper - continue CIWA with PRN Ativan - thiamine PO 100 mg daily - folic acid   PO daily - PT/OT - Transfer to med/surg.  D/C telemetry.  Hypotension, likely secondary to reduced EF vs Ativan. MAP's have been maintained well. Fluid responsive with bolus as needed. No evidence of infection/sepsis. - maintain MAP>65 - continue maintenance fluids  Hyponatremia - resolved with fluids - likely beer podomania - monitor BMP  Hypokalemia - repleted - monitor & replete as needed  Hypophosphatemia - improved with IV repletion 11/22.  Concern for refeeding syndrome in setting of chronic alcoholism.  - further IV repletion today - continue to monitor and replete as needed   HFrEF - Echo showed EF 20-25%.  Suspect dilated CM secondary to chronic alcohol use.   - has been hypotensive, hold off on ACEI, beta blocker for now - no volume overloaded at this time - advise complete alcohol cessation - cardiology consulted  Elevated Troponin - present on admission. No chest pain. Likely demand ischemia in setting of hypoxia. No indication for intervention. - cardiology consulted - Echo showed EF 20-25%  Anemia of Chronic Disease - iron panel consistent with ACD.  No evidence of bleeding.  Hbg likely diluted as well given IVF's since admission. - monitor CBC  DVT prophylaxis:Lovenox Code Status: Full Code Family Communication:none at bedside Disposition Plan:PT recommends SNF.  D/C pending further improvement, mainly electrolytes   Consultants:  Cardiology  Procedures:  Echo 11/20  Antimicrobials:  Vanc/Cefepime in ED   Objective: Vitals:   08/10/19 1100 08/10/19 1200 08/10/19 1300 08/10/19 1400  BP: 108/72 106/71 104/77 100/76  Pulse: 67 63 66 66  Resp: (!) Temp:      TempSrc:  SpO2: 96% 96% 98% 96%  Weight:      Height:        Intake/Output Summary (Last 24 hours) at 08/10/2019 1410 Last data filed at 08/10/2019 1137 Gross per 24 hour  Intake 2435.6 ml  Output 825 ml  Net 1610.6 ml    Filed Weights   08/06/19 2215  Weight: 58 kg    Examination:  General exam: sleeping comfortably, no acute distress Respiratory system: clear to auscultation bilaterally, no wheezes, rales or rhonchi, normal respiratory effort. Cardiovascular system: normal S1/S2, RRR, no JVD, murmurs, rubs, gallops, no pedal edema.   Gastrointestinal system: soft, non-tender, non-distended  Extremities: no cyanosis, no edema, normal tone Skin: dry, intact, normal temperature Psychiatry: sedated, normal mood and congruent affect when awake    Data Reviewed: I have personally reviewed following labs and imaging studies  CBC: Recent Labs  Lab 08/06/19 2134 08/07/19 0519 08/08/19 0437 08/09/19 0358 08/10/19 0654  WBC 7.4 6.6 4.4 3.9* 3.6*  NEUTROABS  --   --  3.2 2.7  --   HGB 13.0 11.4* 11.2* 9.8* 9.7*  HCT 37.4* 30.5* 32.2* 27.2* 28.1*  MCV 91.0 85.0 89.9 87.2 93.0  PLT 157 125* 131* 119* 145*   Basic Metabolic Panel: Recent Labs  Lab 08/06/19 2134 08/07/19 0519 08/08/19 0437 08/09/19 0358 08/10/19 0654  NA 125* 128* 133* 133* 135  K 4.9 3.0* 3.4* 3.9 4.1  CL 85* 95* 102 104 107  CO2 12* 21* 23 22 22   GLUCOSE 115* 104* 97 99 105*  BUN 5* 6 <5* <5* <5*  CREATININE 1.00 0.66 0.60* 0.49* 0.59*  CALCIUM 9.2 7.5* 7.6* 7.7* 8.1*  MG  --  1.5* 1.9 1.7 1.8  PHOS  --   --  1.4* 1.3* 2.3*   GFR: Estimated Creatinine Clearance: 86.6 mL/min (A) (by C-G formula based on SCr of 0.59 mg/dL (L)). Liver Function Tests: Recent Labs  Lab 08/06/19 2134 08/07/19 0519 08/09/19 0358 08/10/19 0654  AST 187* 127* 117* 81*  ALT 60* 45* 36 37  ALKPHOS 106 85 80 76  BILITOT 2.8* 2.4* 0.7 0.7  PROT 8.3* 6.3* 5.3* 5.3*  ALBUMIN 4.2 3.3* 2.7* 2.6*   No results for input(s): LIPASE, AMYLASE in the last 168 hours. No results for input(s): AMMONIA in the last 168 hours. Coagulation Profile: Recent Labs  Lab 08/07/19 0430  INR 1.3*   Cardiac Enzymes: Recent Labs  Lab 08/06/19 2325   CKTOTAL 401*   BNP (last 3 results) No results for input(s): PROBNP in the last 8760 hours. HbA1C: No results for input(s): HGBA1C in the last 72 hours. CBG: Recent Labs  Lab 08/09/19 1959 08/10/19 0013 08/10/19 0348 08/10/19 0735 08/10/19 1124  GLUCAP 134* 105* 85 107* 120*   Lipid Profile: No results for input(s): CHOL, HDL, LDLCALC, TRIG, CHOLHDL, LDLDIRECT in the last 72 hours. Thyroid Function Tests: Recent Labs    08/09/19 0358  FREET4 1.08   Anemia Panel: Recent Labs    08/10/19 0654  VITAMINB12 557  FOLATE 14.6  FERRITIN 266  TIBC 158*  IRON 32*  RETICCTPCT 2.2   Sepsis Labs: Recent Labs  Lab 08/06/19 2134 08/06/19 2325  LATICACIDVEN >11.0* 5.6*    Recent Results (from the past 240 hour(s))  Blood culture (routine x 2)     Status: None (Preliminary result)   Collection Time: 08/06/19  9:48 PM   Specimen: BLOOD  Result Value Ref Range Status   Specimen Description BLOOD BLOOD RIGHT FOREARM  Final  Special Requests   Final    BOTTLES DRAWN AEROBIC AND ANAEROBIC Blood Culture adequate volume   Culture   Final    NO GROWTH 4 DAYS Performed at [facility], [address] [place], [place] [address]    Report Status PENDING  Incomplete  Blood culture (routine x 2)     Status: None (Preliminary result)   Collection Time: 08/06/19  9:48 PM   Specimen: BLOOD  Result Value Ref Range Status   Specimen Description BLOOD LEFT ANTECUBITAL  Final   Special Requests   Final    BOTTLES DRAWN AEROBIC AND ANAEROBIC Blood Culture adequate volume   Culture   Final    NO GROWTH 4 DAYS Performed at [facility], [address] [place], [place] [address]    Report Status PENDING  Incomplete  SARS CORONAVIRUS 2 (TAT 6-24 HRS) Nasopharyngeal Nasopharyngeal Swab     Status: None   Collection Time: 08/07/19 12:09 AM   Specimen: Nasopharyngeal Swab  Result Value Ref Range Status   SARS Coronavirus 2 NEGATIVE NEGATIVE Final    Comment: (NOTE)  SARS-CoV-2 target nucleic acids are NOT DETECTED. The SARS-CoV-2 RNA is generally detectable in upper and lower respiratory specimens during the acute phase of infection. Negative results do not preclude SARS-CoV-2 infection, do not rule out co-infections with other pathogens, and should not be used as the sole basis for treatment or other patient management decisions. Negative results must be combined with clinical observations, patient history, and epidemiological information. The expected result is Negative. Fact Sheet for Patients: SugarRoll.be Fact Sheet for Healthcare Providers: https://www.woods-mathews.com/ This test is not yet approved or cleared by the [place] FDA and  has been authorized for detection and/or diagnosis of SARS-CoV-2 by FDA under an Emergency Use Authorization (EUA). This EUA will remain  in effect (meaning this test can be used) for the duration of the COVID-19 declaration under Section 56 4(b)(1) of the Act, 21 U.S.C. section 360bbb-3(b)(1), unless the authorization is terminated or revoked sooner. Performed at [facility], [place] [address], [place], [place] [address]   MRSA PCR Screening     Status: None   Collection Time: 08/07/19  6:04 PM   Specimen: Nasal Mucosa; Nasopharyngeal  Result Value Ref Range Status   MRSA by PCR NEGATIVE NEGATIVE Final    Comment:        The GeneXpert MRSA Assay (FDA approved for NASAL specimens only), is one component of a comprehensive MRSA colonization surveillance program. It is not intended to diagnose MRSA infection nor to guide or monitor treatment for MRSA infections. Performed at [facility] [address], [place], [place] [address]          Radiology Studies: No results found.      Scheduled Meds: . aspirin EC  325 mg Oral Daily  . atorvastatin  40 mg Oral q1800  . chlordiazePOXIDE  25 mg Oral Daily  . Chlorhexidine Gluconate  Cloth  6 each Topical Q0600  . enoxaparin (LOVENOX) injection  40 mg Subcutaneous Q24H  . folic acid  1 mg Oral Daily  . LORazepam  0-4 mg Intravenous Q4H   Or  . LORazepam  0-4 mg Oral Q4H  . metoprolol tartrate  25 mg Oral BID  . multivitamin with minerals  1 tablet Oral Daily  . thiamine  100 mg Oral Daily   Continuous Infusions: . dexmedetomidine (PRECEDEX) IV infusion 0.3 mcg/kg/hr (08/10/19 1137)  . dextrose 5 % and 0.9 %  NaCl with KCl 40 mEq/L 100 mL/hr at 08/10/19 1137  . sodium phosphate  Dextrose 5% IVPB       LOS: 3 days    Time spent: 30-35 minutes    Pennie BanterKelly A Aidric Endicott, DO [facility] Pager: [phone]  If 7PM-7AM, please contact night-coverage [email or web address] Password [email or web address] 08/10/2019, 2:10 PM

## 2019-08-10 NOTE — Progress Notes (Signed)
Patient woke up and removed condom cath. Patient was soaked in urine laying in the bed. Patient verbally abuse during bathing. Patient had a 500cc bolus for soft SBP. Precedex increase to 0.3. Patient has been resting since. Endorsed.

## 2019-08-11 ENCOUNTER — Other Ambulatory Visit: Payer: Self-pay

## 2019-08-11 DIAGNOSIS — I5020 Unspecified systolic (congestive) heart failure: Secondary | ICD-10-CM | POA: Diagnosis not present

## 2019-08-11 DIAGNOSIS — I959 Hypotension, unspecified: Secondary | ICD-10-CM | POA: Diagnosis not present

## 2019-08-11 DIAGNOSIS — F10231 Alcohol dependence with withdrawal delirium: Secondary | ICD-10-CM | POA: Diagnosis not present

## 2019-08-11 LAB — COMPREHENSIVE METABOLIC PANEL
ALT: 35 U/L (ref 0–44)
AST: 52 U/L — ABNORMAL HIGH (ref 15–41)
Albumin: 2.8 g/dL — ABNORMAL LOW (ref 3.5–5.0)
Alkaline Phosphatase: 89 U/L (ref 38–126)
Anion gap: 8 (ref 5–15)
BUN: <5 mg/dL — ABNORMAL LOW (ref 6–20)
CO2: 20 mmol/L — ABNORMAL LOW (ref 22–32)
Calcium: 7.9 mg/dL — ABNORMAL LOW (ref 8.9–10.3)
Chloride: 105 mmol/L (ref 98–111)
Creatinine, Ser: 0.57 mg/dL — ABNORMAL LOW (ref 0.61–1.24)
GFR calc Af Amer: >60 mL/min (ref >60)
GFR calc non Af Amer: >60 mL/min (ref >60)
Glucose, Bld: 75 mg/dL (ref 70–99)
Potassium: 3.7 mmol/L (ref 3.5–5.1)
Sodium: 133 mmol/L — ABNORMAL LOW (ref 135–145)
Total Bilirubin: 0.6 mg/dL (ref 0.3–1.2)
Total Protein: 5.9 g/dL — ABNORMAL LOW (ref 6.5–8.1)

## 2019-08-11 LAB — BASIC METABOLIC PANEL
Anion gap: 6 (ref 5–15)
BUN: <5 mg/dL — ABNORMAL LOW (ref 6–20)
CO2: 25 mmol/L (ref 22–32)
Calcium: 8.2 mg/dL — ABNORMAL LOW (ref 8.9–10.3)
Chloride: 105 mmol/L (ref 98–111)
Creatinine, Ser: 0.5 mg/dL — ABNORMAL LOW (ref 0.61–1.24)
GFR calc Af Amer: >60 mL/min (ref >60)
GFR calc non Af Amer: >60 mL/min (ref >60)
Glucose, Bld: 102 mg/dL — ABNORMAL HIGH (ref 70–99)
Potassium: 3.9 mmol/L (ref 3.5–5.1)
Sodium: 136 mmol/L (ref 135–145)

## 2019-08-11 LAB — CBC
HCT: 30.2 % — ABNORMAL LOW (ref 39.0–52.0)
Hemoglobin: 10.7 g/dL — ABNORMAL LOW (ref 13.0–17.0)
MCH: 31.6 pg (ref 26.0–34.0)
MCHC: 35.4 g/dL (ref 30.0–36.0)
MCV: 89.1 fL (ref 80.0–100.0)
Platelets: 172 10*3/uL (ref 150–400)
RBC: 3.39 MIL/uL — ABNORMAL LOW (ref 4.22–5.81)
RDW: 13.7 % (ref 11.5–15.5)
WBC: 4.2 10*3/uL (ref 4.0–10.5)
nRBC: 0 % (ref 0.0–0.2)

## 2019-08-11 LAB — MAGNESIUM: Magnesium: 1.3 mg/dL — ABNORMAL LOW (ref 1.7–2.4)

## 2019-08-11 LAB — GLUCOSE, CAPILLARY
Glucose-Capillary: 149 mg/dL — ABNORMAL HIGH (ref 70–99)
Glucose-Capillary: 106 mg/dL — ABNORMAL HIGH (ref 70–99)
Glucose-Capillary: 155 mg/dL — ABNORMAL HIGH (ref 70–99)
Glucose-Capillary: 99 mg/dL (ref 70–99)
Glucose-Capillary: 161 mg/dL — ABNORMAL HIGH (ref 70–99)
Glucose-Capillary: 282 mg/dL — ABNORMAL HIGH (ref 70–99)

## 2019-08-11 LAB — PHOSPHORUS: Phosphorus: 3.7 mg/dL (ref 2.5–4.6)

## 2019-08-11 MED ORDER — MAGNESIUM SULFATE 4 GM/100ML IV SOLN
4.0000 g | Freq: Once | INTRAVENOUS | Status: AC
  Administered 2019-08-11: 4 g via INTRAVENOUS
  Filled 2019-08-11: qty 100

## 2019-08-11 MED ORDER — MIDODRINE HCL 5 MG PO TABS
5.0000 mg | ORAL_TABLET | Freq: Three times a day (TID) | ORAL | Status: DC
  Administered 2019-08-11 – 2019-08-12 (×4): 5 mg via ORAL
  Filled 2019-08-11 (×4): qty 1

## 2019-08-11 MED ORDER — KCL IN DEXTROSE-NACL 40-5-0.9 MEQ/L-%-% IV SOLN
INTRAVENOUS | Status: DC
  Administered 2019-08-11 (×2): via INTRAVENOUS
  Filled 2019-08-11 (×5): qty 1000

## 2019-08-11 MED ORDER — SODIUM CHLORIDE 0.9 % IV BOLUS
1000.0000 mL | Freq: Once | INTRAVENOUS | Status: AC
  Administered 2019-08-11: 1000 mL via INTRAVENOUS

## 2019-08-11 NOTE — Progress Notes (Signed)
Pt being transferred to Room 129. Report called to [place], Therapist, sports. Pt sent with belongings via wheelchair and transporter.

## 2019-08-11 NOTE — Progress Notes (Signed)
PT Cancellation Note  Patient Details Name: Kristopher Duran MRN: [id number] DOB: 1966-02-02   Cancelled Treatment:    Reason Eval/Treat Not Completed: Patient not medically ready.  Chart reviewed.  Pt's BP noted in chart to be 67/53 this morning.  Therapist went to ICU to check pt's most current BP and pt's nurse in room in process of checking BP which was noted to be 77/57.  D/t low BP, will hold PT at this time and re-attempt PT treatment session at a later date/time as medically appropriate.   Leitha Bleak, PT 08/11/19, 10:34 AM [phone]

## 2019-08-11 NOTE — Clinical Social Work Note (Signed)
CSW following for possible SNF placement. Patient does not have any insurance to pay for this. Discussed with MD this morning. Hoping mentation improves once he is through withdrawal.  Dayton Scrape, [place]

## 2019-08-11 NOTE — Progress Notes (Signed)
PROGRESS NOTE    Kristopher Duran  OEU:[id number] DOB: 1965/09/23 DOA: 08/06/2019  PCP: Patient, No Pcp Per    LOS - 4   Brief Narrative:  53 y.o.malewith a history ofalcohol dependence and withdrawal seizures who presented to the emergency room with altered mental status with delirium. The patient is homeless and was foundsleepingin his car,shivering and was cold to touch.In the ED, he was tachycardicHR163,90% on room air, both improved with IVhydration and IV Ativan. He was significantly restless in the ER. EKG showed sinus tachycardiaHR144 with Q waves in V1 and V2 and early repolarization in V3. Labs notable fortroponin 264, elevatedLFTs were elevated, hyponatremia and hypokalemia, with bicarb 12 on BMP. He was given Ativan 1 mg IV x5 and given significantly elevated lactic acid more than 11 initially he was empirically treated with IV cefepime and vancomycin. He was placed on CIWA protocol.Lactate improved with hydration. Admitted to stepdown uniton CIWA protocol and cardiology consulted given elevated troponin.  Withdrawal mostly resolved with Librium taper and PRN Ativan, however patient placed on Precedex overnight 11/22-23 due to agitation, weaning off currently.   Subjective 11/24: Patient awake sitting up in bed.  He denies tremors, fever/chills, N/V/D,chest pain or SOB.  No acute events reported overnight.  Today states he needs to get his care moved.  It's been parked he thinks near/behind [employer] and worried it will get towed.  Asks if he can go get car and come back.  Advised patient only way to leave hospital is to sign out AMA.  Stated he would see if his father could get car for him.  Assessment & Plan:   Principal Problem:   Delirium tremens (HCC) Active Problems:   Hypotension   Anemia of chronic disease   Hypokalemia   Hyponatremia   Hypophosphatemia   HFrEF (heart failure with reduced ejection fraction) (HCC)   EtOH Withdrawal/ Delirium  Tremens- with history of withdrawal seizures.Improving - monitor closely in step-down  -Librium taper completed 11/23 - wean off Precedex - continue CIWA with PRN Ativan - thiamine PO 100 mg daily - folic acid 1mg  PO daily - PT/OT - Transfer to med/surg. D/C telemetry. (awaiting bed)  Hypotension, likely secondary to reduced EF vs Ativan. MAP's have been maintained well. Fluid responsive with bolus as needed. No evidence of infection/sepsis. - maintain MAP>65 - continue maintenance fluids - started midodrine today  Hyponatremia - resolvedwith fluids- likely beer podomania - monitor BMP  Hypokalemia - resolved - monitor & replete as needed  Hypophosphatemia - resolved s/p IV repletion.  Concern for refeeding syndrome in setting ofchronicalcoholism. - further IV repletion today - continue to monitor and replete as needed  HFrEF- Echo showed EF 20-25%. Suspect dilated CM secondary to chronic alcohol use.  - has been hypotensive, hold off on ACEI, beta blocker for now - no volume overloaded at this time - advise complete alcohol cessation - cardiology consulted - recommending low dose BB as tolerated  Elevated Troponin - present on admission. No chest pain. Likely demand ischemia in setting of hypoxia. No indication for intervention. - cardiology consulted - Echo showed EF 20-25%  Anemia of Chronic Disease - iron panel consistent with ACD.  No evidence of bleeding.  Hbg likely diluted as well given IVF's since admission. - monitor CBC  DVT prophylaxis:Lovenox Code Status: Full Code Family Communication:none at bedside Disposition Plan:PT recommends SNF. D/C pending weaned off Precedex, stable electrolytes.  Placement will be challenging as patient homeless without insurance.   Consultants:  Cardiology  Procedures:  Echo 11/20  Antimicrobials:  Vanc/Cefepime in ED   Objective: Vitals:   08/11/19 0500  08/11/19 0600 08/11/19 0700 08/11/19 0755  BP: (!) 85/63 101/73 96/73 (!) 84/72  Pulse: 74 72 71 81  Resp: (!) 21 20 17 17   Temp:    98.1 F (36.7 C)  TempSrc:    Oral  SpO2: 95% 99% 97% 100%  Weight:      Height:        Intake/Output Summary (Last 24 hours) at 08/11/2019 0802 Last data filed at 08/11/2019 0756 Gross per 24 hour  Intake 3219.93 ml  Output 3475 ml  Net -255.07 ml   Filed Weights   08/06/19 2215  Weight: 58 kg    Examination:  General exam: awake, alert, no acute distress, underweight HEENT: atraumatic, clear conjunctiva, anicteric sclera, moist mucus membranes, hearing grossly normal  Respiratory system: clear to auscultation bilaterally, no wheezes, rales or rhonchi, normal respiratory effort. Cardiovascular system: normal S1/S2, RRR, no JVD, murmurs, rubs, gallops, no pedal edema.   Gastrointestinal system: soft, non-tender, non-distended abdomen. Central nervous system: alert and oriented x4. no gross focal neurologic deficits, normal speech Extremities: moves all, no edema, normal tone Psychiatry: normal mood, congruent affect, judgement and insight appear normal    Data Reviewed: I have personally reviewed following labs and imaging studies  CBC: Recent Labs  Lab 08/07/19 0519 08/08/19 0437 08/09/19 0358 08/10/19 0654 08/11/19 0320  WBC 6.6 4.4 3.9* 3.6* 4.2  NEUTROABS  --  3.2 2.7  --   --   HGB 11.4* 11.2* 9.8* 9.7* 10.7*  HCT 30.5* 32.2* 27.2* 28.1* 30.2*  MCV 85.0 89.9 87.2 93.0 89.1  PLT 125* 131* 119* 145* 427   Basic Metabolic Panel: Recent Labs  Lab 08/07/19 0519 08/08/19 0437 08/09/19 0358 08/10/19 0654 08/11/19 0320  NA 128* 133* 133* 135 136  K 3.0* 3.4* 3.9 4.1 3.9  CL 95* 102 104 107 105  CO2 21* 23 22 22 25   GLUCOSE 104* 97 99 105* 102*  BUN 6 <5* <5* <5* <5*  CREATININE 0.66 0.60* 0.49* 0.59* 0.50*  CALCIUM 7.5* 7.6* 7.7* 8.1* 8.2*  MG 1.5* 1.9 1.7 1.8 1.3*  PHOS  --  1.4* 1.3* 2.3* 3.7   GFR: Estimated  Creatinine Clearance: 86.6 mL/min (A) (by C-G formula based on SCr of 0.5 mg/dL (L)). Liver Function Tests: Recent Labs  Lab 08/06/19 2134 08/07/19 0519 08/09/19 0358 08/10/19 0654  AST 187* 127* 117* 81*  ALT 60* 45* 36 37  ALKPHOS 106 85 80 76  BILITOT 2.8* 2.4* 0.7 0.7  PROT 8.3* 6.3* 5.3* 5.3*  ALBUMIN 4.2 3.3* 2.7* 2.6*   No results for input(s): LIPASE, AMYLASE in the last 168 hours. No results for input(s): AMMONIA in the last 168 hours. Coagulation Profile: Recent Labs  Lab 08/07/19 0430  INR 1.3*   Cardiac Enzymes: Recent Labs  Lab 08/06/19 2325  CKTOTAL 401*   BNP (last 3 results) No results for input(s): PROBNP in the last 8760 hours. HbA1C: No results for input(s): HGBA1C in the last 72 hours. CBG: Recent Labs  Lab 08/10/19 1643 08/10/19 1944 08/10/19 2340 08/11/19 0347 08/11/19 0714  GLUCAP 100* 102* 160* 149* 106*   Lipid Profile: No results for input(s): CHOL, HDL, LDLCALC, TRIG, CHOLHDL, LDLDIRECT in the last 72 hours. Thyroid Function Tests: Recent Labs    08/09/19 0358  FREET4 1.08   Anemia Panel: Recent Labs    08/10/19 0654  VITAMINB12 557  FOLATE 14.6  FERRITIN 266  TIBC 158*  IRON 32*  RETICCTPCT 2.2   Sepsis Labs: Recent Labs  Lab 08/06/19 2134 08/06/19 2325  LATICACIDVEN >11.0* 5.6*    Recent Results (from the past 240 hour(s))  Blood culture (routine x 2)     Status: None (Preliminary result)   Collection Time: 08/06/19  9:48 PM   Specimen: BLOOD  Result Value Ref Range Status   Specimen Description BLOOD BLOOD RIGHT FOREARM  Final   Special Requests   Final    BOTTLES DRAWN AEROBIC AND ANAEROBIC Blood Culture adequate volume   Culture   Final    NO GROWTH 4 DAYS Performed at [facility], [address] [place], [place] [address]    Report Status PENDING  Incomplete  Blood culture (routine x 2)     Status: None (Preliminary result)   Collection Time: 08/06/19  9:48 PM   Specimen: BLOOD  Result  Value Ref Range Status   Specimen Description BLOOD LEFT ANTECUBITAL  Final   Special Requests   Final    BOTTLES DRAWN AEROBIC AND ANAEROBIC Blood Culture adequate volume   Culture   Final    NO GROWTH 4 DAYS Performed at [facility], [address], [place], [place] [address]    Report Status PENDING  Incomplete  SARS CORONAVIRUS 2 (TAT 6-24 HRS) Nasopharyngeal Nasopharyngeal Swab     Status: None   Collection Time: 08/07/19 12:09 AM   Specimen: Nasopharyngeal Swab  Result Value Ref Range Status   SARS Coronavirus 2 NEGATIVE NEGATIVE Final    Comment: (NOTE) SARS-CoV-2 target nucleic acids are NOT DETECTED. The SARS-CoV-2 RNA is generally detectable in upper and lower respiratory specimens during the acute phase of infection. Negative results do not preclude SARS-CoV-2 infection, do not rule out co-infections with other pathogens, and should not be used as the sole basis for treatment or other patient management decisions. Negative results must be combined with clinical observations, patient history, and epidemiological information. The expected result is Negative. Fact Sheet for Patients: HairSlick.no Fact Sheet for Healthcare Providers: quierodirigir.com This test is not yet approved or cleared by the [place] FDA and  has been authorized for detection and/or diagnosis of SARS-CoV-2 by FDA under an Emergency Use Authorization (EUA). This EUA will remain  in effect (meaning this test can be used) for the duration of the COVID-19 declaration under Section 56 4(b)(1) of the Act, 21 U.S.C. section 360bbb-3(b)(1), unless the authorization is terminated or revoked sooner. Performed at [facility], [address] [place], [place] [address]   MRSA PCR Screening     Status: None   Collection Time: 08/07/19  6:04 PM   Specimen: Nasal Mucosa; Nasopharyngeal  Result Value Ref Range Status   MRSA by PCR  NEGATIVE NEGATIVE Final    Comment:        The GeneXpert MRSA Assay (FDA approved for NASAL specimens only), is one component of a comprehensive MRSA colonization surveillance program. It is not intended to diagnose MRSA infection nor to guide or monitor treatment for MRSA infections. Performed at [facility], [address], [place], [place] [address]          Radiology Studies: No results found.      Scheduled Meds: . aspirin EC  325 mg Oral Daily  . atorvastatin  40 mg Oral q1800  . chlordiazePOXIDE  25 mg Oral Daily  . Chlorhexidine Gluconate Cloth  6 each Topical Q0600  .  enoxaparin (LOVENOX) injection  40 mg Subcutaneous Q24H  . folic acid  1 mg Oral Daily  . LORazepam  0-4 mg Intravenous Q4H   Or  . LORazepam  0-4 mg Oral Q4H  . metoprolol tartrate  25 mg Oral BID  . multivitamin with minerals  1 tablet Oral Daily  . thiamine  100 mg Oral Daily   Continuous Infusions: . dexmedetomidine (PRECEDEX) IV infusion 0.2 mcg/kg/hr (08/11/19 0600)  . dextrose 5 % and 0.9% NaCl 1,000 mL with potassium chloride 40 mEq infusion 100 mL/hr at 08/11/19 0201  . magnesium sulfate bolus IVPB       LOS: 4 days    Time spent: 30-35 minutes    Pennie BanterKelly A Daesia Zylka, DO [facility] Pager: [phone]  If 7PM-7AM, please contact night-coverage [email or web address] Password [email or web address] 08/11/2019, 8:02 AM

## 2019-08-11 NOTE — Progress Notes (Signed)
[facility] Cardiology [facility] Encounter Note  Patient: Kristopher Duran / Admit Date: 08/06/2019 / Date of Encounter: 08/11/2019, 9:48 AM   Subjective: Patient is improved from admission with no evidence of significant symptoms chest pain or shortness of breath or congestive heart failure overnight..  Patient has been ambulating to the chair without issues and overall electrolyte values improved without evidence of significant changes.  Patient is tolerating low-dose medication management including metoprolol at this time but may not be able to start ace yet Echocardiogram showing severe LV systolic dysfunction with ejection fraction of 25% and mild valvular heart disease No current concerns for heart failure and or ACS Review of Systems: Positive for: Shortness of breath Negative for: Vision change, hearing change, syncope, dizziness, nausea, vomiting,diarrhea, bloody stool, stomach pain, cough, congestion, diaphoresis, urinary frequency, urinary pain,skin lesions, skin rashes Others previously listed  Objective: Telemetry: Normal sinus rhythm Physical Exam: Blood pressure 94/72, pulse 80, temperature 98.1 F (36.7 C), temperature source Oral, resp. rate (!) 24, height 5\' 9"  (1.753 m), weight 58 kg, SpO2 100 %. Body mass index is 18.88 kg/m. General: Well developed, well nourished, in no acute distress. Head: Normocephalic, atraumatic, sclera non-icteric, no xanthomas, nares are without discharge. Neck: No apparent masses Lungs: Normal respirations with no wheezes, no rhonchi, no rales , no crackles   Heart: Regular rate and rhythm, normal S1 S2, no murmur, no rub, no gallop, PMI is normal size and placement, carotid upstroke normal without bruit, jugular venous pressure normal Abdomen: Soft, non-tender, non-distended with normoactive bowel sounds. No hepatosplenomegaly. Abdominal aorta is normal size without bruit Extremities: No edema, no clubbing, no cyanosis, no ulcers,  Peripheral:  2+ radial, 2+ femoral, 2+ dorsal pedal pulses Neuro: Alert and oriented. Moves all extremities spontaneously. Psych:  Responds to questions appropriately with a normal affect.   Intake/Output Summary (Last 24 hours) at 08/11/2019 0948 Last data filed at 08/11/2019 0920 Gross per 24 hour  Intake 3359.42 ml  Output 3150 ml  Net 209.42 ml    Inpatient Medications:  . aspirin EC  325 mg Oral Daily  . atorvastatin  40 mg Oral q1800  . chlordiazePOXIDE  25 mg Oral Daily  . Chlorhexidine Gluconate Cloth  6 each Topical Q0600  . enoxaparin (LOVENOX) injection  40 mg Subcutaneous Q24H  . folic acid  1 mg Oral Daily  . LORazepam  0-4 mg Intravenous Q4H   Or  . LORazepam  0-4 mg Oral Q4H  . metoprolol tartrate  25 mg Oral BID  . multivitamin with minerals  1 tablet Oral Daily  . thiamine  100 mg Oral Daily   Infusions:  . dexmedetomidine (PRECEDEX) IV infusion 0.2 mcg/kg/hr (08/11/19 0920)  . dextrose 5 % and 0.9% NaCl 1,000 mL with potassium chloride 40 mEq infusion 100 mL/hr at 08/11/19 0201  . magnesium sulfate bolus IVPB 50 mL/hr at 08/11/19 0920    Labs: Recent Labs    08/10/19 0654 08/11/19 0320  NA 135 136  K 4.1 3.9  CL 107 105  CO2 22 25  GLUCOSE 105* 102*  BUN <5* <5*  CREATININE 0.59* 0.50*  CALCIUM 8.1* 8.2*  MG 1.8 1.3*  PHOS 2.3* 3.7   Recent Labs    08/09/19 0358 08/10/19 0654  AST 117* 81*  ALT 36 37  ALKPHOS 80 76  BILITOT 0.7 0.7  PROT 5.3* 5.3*  ALBUMIN 2.7* 2.6*   Recent Labs    08/09/19 0358 08/10/19 0654 08/11/19 0320  WBC 3.9* 3.6* 4.2  NEUTROABS 2.7  --   --   HGB 9.8* 9.7* 10.7*  HCT 27.2* 28.1* 30.2*  MCV 87.2 93.0 89.1  PLT 119* 145* 172   No results for input(s): CKTOTAL, CKMB, TROPONINI in the last 72 hours. Invalid input(s): POCBNP No results for input(s): HGBA1C in the last 72 hours.   Weights: Filed Weights   08/06/19 2215  Weight: 58 kg     Radiology/Studies:  Dg Chest Portable 1 View  Result Date:  08/06/2019 CLINICAL DATA:  Shortness of breath EXAM: PORTABLE CHEST 1 VIEW COMPARISON:  Chest radiograph dated 09/15/2015 FINDINGS: The heart size and mediastinal contours are within normal limits. Both lungs are clear. The visualized skeletal structures are unremarkable. IMPRESSION: No active disease. Electronically Signed   By: Zerita Boers M.D.   On: 08/06/2019 22:03     Assessment and Recommendation  53 y.o. male with acute unresponsiveness  And withdrawal who has now recovered and has an echocardiogram showing a severe LV systolic dysfunction needing further medication management throughout this hospitalization and outpatient as well but no current cardiac SX 1.  Continue low-dose beta-blocker for cardiomyopathy as long as patient is tolerating treatment 2.  Continue recovery and ambulation and follow for improvements in further adjustments of medications as we see during recovery 3.  Patient okay for inpatient rehabilitation as well as transfer to telemetry and dc to outside facility when able.  Patient may require home placement 4. Call if further questions Signed, Serafina Royals M.D. FACC

## 2019-08-11 NOTE — Progress Notes (Signed)
Physical Therapy Treatment Patient Details Name: Kristopher Duran MRN: [id number] DOB: 11-01-1965 Today's Date: 08/11/2019    History of Present Illness 53 yo male with onset of hypotension and AMS with delirium was sent to hospital, was cold in car and asleep. Covid negative.  SOB and tachypneic, has limited history.  PMHx:  EtOH abuse, mandible fracture, inguinal hernia, seizures.    PT Comments    Pt resting in bed upon PT arrival and agreeable and appearing eager to walk.  Pt's BP 108/75 beginning of session resting in bed and 111/81 end of session resting in chair.  Pt able to ambulate 162 feet with RW CGA; overall pt steady ambulating with RW (see below for gait mechanics details) but pt reporting feeling unsteady without UE support on walker.  D/t pt progressing with functional mobility, PT follow-up recommendations updated to HHPT (CM notified).  Will continue to focus on strengthening, balance, and progressive functional mobility during hospital stay.    Follow Up Recommendations  Home health PT     Equipment Recommendations  Rolling walker with 5" wheels    Recommendations for Other Services       Precautions / Restrictions Precautions Precautions: Fall Restrictions Weight Bearing Restrictions: No    Mobility  Bed Mobility Overal bed mobility: Needs Assistance Bed Mobility: Supine to Sit     Supine to sit: Supervision;HOB elevated     General bed mobility comments: SBA for lines  Transfers Overall transfer level: Needs assistance Equipment used: Rolling walker (2 wheeled) Transfers: Sit to/from Stand Sit to Stand: Min guard         General transfer comment: fairly strong stand from bed up to RW  Ambulation/Gait Ambulation/Gait assistance: Min guard Gait Distance (Feet): 162 Feet Assistive device: Rolling walker (2 wheeled)   Gait velocity: mildly decreased   General Gait Details: mildly more narrow BOS but steady with partial step through gait pattern  progressing to step through gait pattern   Stairs             Wheelchair Mobility    Modified Rankin (Stroke Patients Only)       Balance Overall balance assessment: Needs assistance Sitting-balance support: No upper extremity supported;Feet supported Sitting balance-Leahy Scale: Normal Sitting balance - Comments: steady sitting reaching outside BOS   Standing balance support: Single extremity supported Standing balance-Leahy Scale: Poor Standing balance comment: pt requiring at least single UE support for static standing balance (pt reporting feeling unsteady without UE support)                            Cognition Arousal/Alertness: Awake/alert Behavior During Therapy: Impulsive                                   General Comments: Oriented to person and place but needing some cueing for situation      Exercises      General Comments   Nursing cleared pt for participation in physical therapy.  Pt agreeable to PT session.  Sitter present during session.      Pertinent Vitals/Pain Pain Assessment: No/denies pain  Vitals stable and WFL throughout treatment session.    Home Living                      Prior Function            PT Goals (  current goals can now be found in the care plan section) Acute Rehab PT Goals Patient Stated Goal: to improve balance and walking PT Goal Formulation: With patient Time For Goal Achievement: 08/22/19 Potential to Achieve Goals: Good Progress towards PT goals: Progressing toward goals    Frequency    Min 2X/week      PT Plan Discharge plan needs to be updated(CM notified)    Co-evaluation              AM-PAC PT "6 Clicks" Mobility   Outcome Measure  Help needed turning from your back to your side while in a flat bed without using bedrails?: A Little Help needed moving from lying on your back to sitting on the side of a flat bed without using bedrails?: A Little Help  needed moving to and from a bed to a chair (including a wheelchair)?: A Little Help needed standing up from a chair using your arms (e.g., wheelchair or bedside chair)?: A Little Help needed to walk in hospital room?: A Little Help needed climbing 3-5 steps with a railing? : A Little 6 Click Score: 18    End of Session Equipment Utilized During Treatment: Gait belt Activity Tolerance: Patient tolerated treatment well Patient left: in chair;with call bell/phone within reach;with nursing/sitter in room;Other (comment)(Sitter present who verbalized no need for chair alarm) Nurse Communication: Mobility status PT Visit Diagnosis: Unsteadiness on feet (R26.81);Muscle weakness (generalized) (M62.81);History of falling (Z91.81);Adult, failure to thrive (R62.7)     Time: 0814-4818 PT Time Calculation (min) (ACUTE ONLY): 26 min  Charges:  $Gait Training: 8-22 mins $Therapeutic Exercise: 8-22 mins                     Leitha Bleak, PT 08/11/19, 5:37 PM [phone]

## 2019-08-12 DIAGNOSIS — F10231 Alcohol dependence with withdrawal delirium: Principal | ICD-10-CM

## 2019-08-12 LAB — PHOSPHORUS: Phosphorus: 2.1 mg/dL — ABNORMAL LOW (ref 2.5–4.6)

## 2019-08-12 LAB — GLUCOSE, CAPILLARY
Glucose-Capillary: 87 mg/dL (ref 70–99)
Glucose-Capillary: 93 mg/dL (ref 70–99)

## 2019-08-12 LAB — MAGNESIUM: Magnesium: 1.8 mg/dL (ref 1.7–2.4)

## 2019-08-12 MED ORDER — SODIUM PHOSPHATES 45 MMOLE/15ML IV SOLN
30.0000 mmol | Freq: Once | INTRAVENOUS | Status: AC
  Administered 2019-08-12: 09:00:00 30 mmol via INTRAVENOUS
  Filled 2019-08-12: qty 10

## 2019-08-12 NOTE — Progress Notes (Signed)
Transporter approached this RN as patient unable to reach mother to pick him up. Pt insists on being taken to medical mall exit as he plans to walk to the mall. Offered to get patient a taxi at hospital expense, pt refused. It is reported that patient is his own guardian. Attempts to encourage pt to wait for a ride or take taxi failed and patient refuses to stay. Transport escorted patient to the medical mall exit. Clayborne Dana, RN

## 2019-08-12 NOTE — Discharge Summary (Signed)
Physician Discharge Summary  Kristopher Duran POE:[id number] DOB: [birth year] DOA: 08/06/2019  PCP: Patient, No Pcp Per  Admit date: 08/06/2019 Discharge date: 08/12/2019   Patient left AMA.  Admitted From: Home Disposition: Home/self-care  Recommendations for Outpatient Follow-up:  1. Follow up with PCP in 1-2 weeks 2. Follow-up with cardiology. 3. Please obtain BMP/CBC in one week 4. Please follow up on the following pending results:None  Home Health: No Equipment/Devices: None Discharge Condition: Stable CODE STATUS: Full Diet recommendation: Heart Healthy / Carb Modified / Regular / Dysphagia   Brief/Interim Summary: 53 y.o.malewith a history ofalcohol dependence and withdrawal seizures who presented to the emergency room with altered mental status with delirium. The patient is homeless and was foundsleepingin his car,shivering and was cold to touch.In the ED, he was tachycardicHR163,90% on room air, both improved with IVhydration and IV Ativan.  Because of worsening agitation he did required overnight Precedex.  Most of his withdrawal was managed with Ativan.  Patient continued to have some electrolyte abnormalities which were being repleted as needed. He was also found to have cardiomyopathy with ejection fraction of 20 to 25%. Cardiology work-up was undergoing.  He was supposed to start beta-blocker and ARB which were under consideration and not been started yet due to softer blood pressure.  He was started on midodrine.  This morning patient become very irritable, stating that he is not getting good care and he would like to leave AMA as there are other better institutes. Tried explaining him multiple times that he is almost ready to be discharged but need further appointments for cardiac rehab and cardiology and he should see them before you leave.  Patient refused to take phosphorus and magnesium which was supposed to be given today and left AMA.  Discharge Diagnoses:   Principal Problem:   Delirium tremens (HCC) Active Problems:   Hypotension   Hypokalemia   Hyponatremia   Hypophosphatemia   HFrEF (heart failure with reduced ejection fraction) (HCC)   Anemia of chronic disease   Discharge Instructions  Discharge Instructions    Diet - low sodium heart healthy   Complete by: As directed    Increase activity slowly   Complete by: As directed      Allergies as of 08/12/2019   No Known Allergies     Medication List    TAKE these medications   diltiazem 180 MG 24 hr capsule Commonly known as: CARDIZEM CD Take 1 capsule (180 mg total) by mouth daily.   feeding supplement (ENSURE ENLIVE) Liqd Take 237 mLs by mouth 3 (three) times daily between meals.   folic acid 1 MG tablet Commonly known as: FOLVITE Take 1 tablet (1 mg total) by mouth daily.   thiamine 100 MG tablet Take 1 tablet (100 mg total) by mouth daily.      Follow-up Information    Lamar Blinks, MD Follow up in 2 week(s).   Specialty: Cardiology Contact information: [address] [facility] [place] [address] [phone]          No Known Allergies  Consultations:  Cardiology  Procedures/Studies: Dg Chest Portable 1 View  Result Date: 08/06/2019 CLINICAL DATA:  Shortness of breath EXAM: PORTABLE CHEST 1 VIEW COMPARISON:  Chest radiograph dated 09/15/2015 FINDINGS: The heart size and mediastinal contours are within normal limits. Both lungs are clear. The visualized skeletal structures are unremarkable. IMPRESSION: No active disease. Electronically Signed   By: Romona Curls M.D.   On: 08/06/2019 22:03  Subjective: Patient had no new complaints but wants to go to some other place to get better treatment.  We will to provide with any specific complaints.  Discharge Exam: Vitals:   08/12/19 0746 08/12/19 1102  BP: 103/73 91/68  Pulse: 91 84  Resp: 17   Temp: 98.6 F (37 C)   SpO2: 97% 97%   Vitals:   08/12/19  0404 08/12/19 0535 08/12/19 0746 08/12/19 1102  BP: 98/73 (!) 97/58 103/73 91/68  Pulse: (!) 102 97 91 84  Resp: 17  17   Temp: 99.3 F (37.4 C)  98.6 F (37 C)   TempSrc: Oral  Oral   SpO2: 97% 97% 97% 97%  Weight:      Height:        General: Pt is alert, awake, not in acute distress Cardiovascular: RRR, S1/S2 +, no rubs, no gallops Respiratory: CTA bilaterally, no wheezing, no rhonchi Abdominal: Soft, NT, ND, bowel sounds + Extremities: no edema, no cyanosis   The results of significant diagnostics from this hospitalization (including imaging, microbiology, ancillary and laboratory) are listed below for reference.     Microbiology: Recent Results (from the past 240 hour(s))  Blood culture (routine x 2)     Status: None (Preliminary result)   Collection Time: 08/06/19  9:48 PM   Specimen: BLOOD  Result Value Ref Range Status   Specimen Description BLOOD BLOOD RIGHT FOREARM  Final   Special Requests   Final    BOTTLES DRAWN AEROBIC AND ANAEROBIC Blood Culture adequate volume   Culture   Final    NO GROWTH 4 DAYS Performed at [facility], [address] [place], [place] [address]    Report Status PENDING  Incomplete  Blood culture (routine x 2)     Status: None (Preliminary result)   Collection Time: 08/06/19  9:48 PM   Specimen: BLOOD  Result Value Ref Range Status   Specimen Description BLOOD LEFT ANTECUBITAL  Final   Special Requests   Final    BOTTLES DRAWN AEROBIC AND ANAEROBIC Blood Culture adequate volume   Culture   Final    NO GROWTH 4 DAYS Performed at [facility], [address], [place], [place] [address]    Report Status PENDING  Incomplete  SARS CORONAVIRUS 2 (TAT 6-24 HRS) Nasopharyngeal Nasopharyngeal Swab     Status: None   Collection Time: 08/07/19 12:09 AM   Specimen: Nasopharyngeal Swab  Result Value Ref Range Status   SARS Coronavirus 2 NEGATIVE NEGATIVE Final    Comment: (NOTE) SARS-CoV-2 target nucleic acids are NOT  DETECTED. The SARS-CoV-2 RNA is generally detectable in upper and lower respiratory specimens during the acute phase of infection. Negative results do not preclude SARS-CoV-2 infection, do not rule out co-infections with other pathogens, and should not be used as the sole basis for treatment or other patient management decisions. Negative results must be combined with clinical observations, patient history, and epidemiological information. The expected result is Negative. Fact Sheet for Patients: SugarRoll.be Fact Sheet for Healthcare Providers: https://www.woods-mathews.com/ This test is not yet approved or cleared by the [place] FDA and  has been authorized for detection and/or diagnosis of SARS-CoV-2 by FDA under an Emergency Use Authorization (EUA). This EUA will remain  in effect (meaning this test can be used) for the duration of the COVID-19 declaration under Section 56 4(b)(1) of the Act, 21 U.S.C. section 360bbb-3(b)(1), unless the authorization is terminated or revoked sooner. Performed at [facility], [address] [place], [place] [address]   MRSA PCR Screening     Status: None   Collection Time: 08/07/19  6:04 PM   Specimen: Nasal Mucosa; Nasopharyngeal  Result Value Ref Range Status   MRSA by PCR NEGATIVE NEGATIVE Final    Comment:        The GeneXpert MRSA Assay (FDA approved for NASAL specimens only), is one component of a comprehensive MRSA colonization surveillance program. It is not intended to diagnose MRSA infection nor to guide or monitor treatment for MRSA infections. Performed at [facility], [address] [place], [place] [address]      Labs: BNP (last 3 results) No results for input(s): BNP in the last 8760 hours. Basic Metabolic Panel: Recent Labs  Lab 08/08/19 0437 08/09/19 0358 08/10/19 0654 08/11/19 0320 08/11/19 1337 08/12/19 0451  NA 133* 133* 135 136 133*  --    K 3.4* 3.9 4.1 3.9 3.7  --   CL 102 104 107 105 105  --   CO2 23 22 22 25  20*  --   GLUCOSE 97 99 105* 102* 75  --   BUN <5* <5* <5* <5* <5*  --   CREATININE 0.60* 0.49* 0.59* 0.50* 0.57*  --   CALCIUM 7.6* 7.7* 8.1* 8.2* 7.9*  --   MG 1.9 1.7 1.8 1.3*  --  1.8  PHOS 1.4* 1.3* 2.3* 3.7  --  2.1*   Liver Function Tests: Recent Labs  Lab 08/06/19 2134 08/07/19 0519 08/09/19 0358 08/10/19 0654 08/11/19 1337  AST 187* 127* 117* 81* 52*  ALT 60* 45* 36 37 35  ALKPHOS 106 85 80 76 89  BILITOT 2.8* 2.4* 0.7 0.7 0.6  PROT 8.3* 6.3* 5.3* 5.3* 5.9*  ALBUMIN 4.2 3.3* 2.7* 2.6* 2.8*   No results for input(s): LIPASE, AMYLASE in the last 168 hours. No results for input(s): AMMONIA in the last 168 hours. CBC: Recent Labs  Lab 08/07/19 0519 08/08/19 0437 08/09/19 0358 08/10/19 0654 08/11/19 0320  WBC 6.6 4.4 3.9* 3.6* 4.2  NEUTROABS  --  3.2 2.7  --   --   HGB 11.4* 11.2* 9.8* 9.7* 10.7*  HCT 30.5* 32.2* 27.2* 28.1* 30.2*  MCV 85.0 89.9 87.2 93.0 89.1  PLT 125* 131* 119* 145* 172   Cardiac Enzymes: Recent Labs  Lab 08/06/19 2325  CKTOTAL 401*   BNP: Invalid input(s): POCBNP CBG: Recent Labs  Lab 08/11/19 1605 08/11/19 1933 08/11/19 2124 08/11/19 2352 08/12/19 0749  GLUCAP 99 161* 282* 93 87   D-Dimer No results for input(s): DDIMER in the last 72 hours. Hgb A1c No results for input(s): HGBA1C in the last 72 hours. Lipid Profile No results for input(s): CHOL, HDL, LDLCALC, TRIG, CHOLHDL, LDLDIRECT in the last 72 hours. Thyroid function studies No results for input(s): TSH, T4TOTAL, T3FREE, THYROIDAB in the last 72 hours.  Invalid input(s): FREET3 Anemia work up Recent Labs    08/10/19 0654  VITAMINB12 557  FOLATE 14.6  FERRITIN 266  TIBC 158*  IRON 32*  RETICCTPCT 2.2   Urinalysis    Component Value Date/Time   COLORURINE YELLOW (A) 08/06/2019 2134   APPEARANCEUR CLEAR (A) 08/06/2019 2134   LABSPEC 1.015 08/06/2019 2134   PHURINE 5.0 08/06/2019  2134   GLUCOSEU NEGATIVE 08/06/2019 2134   HGBUR NEGATIVE 08/06/2019 2134   BILIRUBINUR NEGATIVE 08/06/2019 2134   KETONESUR 5 (A) 08/06/2019 2134   PROTEINUR NEGATIVE 08/06/2019 2134   NITRITE NEGATIVE 08/06/2019 2134   LEUKOCYTESUR NEGATIVE  08/06/2019 2134   Sepsis Labs Invalid input(s): PROCALCITONIN,  WBC,  LACTICIDVEN Microbiology Recent Results (from the past 240 hour(s))  Blood culture (routine x 2)     Status: None (Preliminary result)   Collection Time: 08/06/19  9:48 PM   Specimen: BLOOD  Result Value Ref Range Status   Specimen Description BLOOD BLOOD RIGHT FOREARM  Final   Special Requests   Final    BOTTLES DRAWN AEROBIC AND ANAEROBIC Blood Culture adequate volume   Culture   Final    NO GROWTH 4 DAYS Performed at [facility], [address] [place], [place] [address]    Report Status PENDING  Incomplete  Blood culture (routine x 2)     Status: None (Preliminary result)   Collection Time: 08/06/19  9:48 PM   Specimen: BLOOD  Result Value Ref Range Status   Specimen Description BLOOD LEFT ANTECUBITAL  Final   Special Requests   Final    BOTTLES DRAWN AEROBIC AND ANAEROBIC Blood Culture adequate volume   Culture   Final    NO GROWTH 4 DAYS Performed at [facility], [address], [place], [place] [address]    Report Status PENDING  Incomplete  SARS CORONAVIRUS 2 (TAT 6-24 HRS) Nasopharyngeal Nasopharyngeal Swab     Status: None   Collection Time: 08/07/19 12:09 AM   Specimen: Nasopharyngeal Swab  Result Value Ref Range Status   SARS Coronavirus 2 NEGATIVE NEGATIVE Final    Comment: (NOTE) SARS-CoV-2 target nucleic acids are NOT DETECTED. The SARS-CoV-2 RNA is generally detectable in upper and lower respiratory specimens during the acute phase of infection. Negative results do not preclude SARS-CoV-2 infection, do not rule out co-infections with other pathogens, and should not be used as the sole basis for treatment or other patient  management decisions. Negative results must be combined with clinical observations, patient history, and epidemiological information. The expected result is Negative. Fact Sheet for Patients: HairSlick.no Fact Sheet for Healthcare Providers: quierodirigir.com This test is not yet approved or cleared by the [place] FDA and  has been authorized for detection and/or diagnosis of SARS-CoV-2 by FDA under an Emergency Use Authorization (EUA). This EUA will remain  in effect (meaning this test can be used) for the duration of the COVID-19 declaration under Section 56 4(b)(1) of the Act, 21 U.S.C. section 360bbb-3(b)(1), unless the authorization is terminated or revoked sooner. Performed at [facility], [address], [place], [place] [address]   MRSA PCR Screening     Status: None   Collection Time: 08/07/19  6:04 PM   Specimen: Nasal Mucosa; Nasopharyngeal  Result Value Ref Range Status   MRSA by PCR NEGATIVE NEGATIVE Final    Comment:        The GeneXpert MRSA Assay (FDA approved for NASAL specimens only), is one component of a comprehensive MRSA colonization surveillance program. It is not intended to diagnose MRSA infection nor to guide or monitor treatment for MRSA infections. Performed at [facility], [address] [place], [place] [address]     Time coordinating discharge: Over 30 minutes  SIGNED:  Arnetha Courser, MD  [facility] 08/12/2019, 1:26 PM Pager [phone]  If 7PM-7AM, please contact night-coverage [email or web address] Password [email or web address]  This record has been created using Conservation officer, historic buildings. Errors have been sought and corrected,but may not always be located. Such creation errors do not reflect on the standard of care.

## 2019-08-12 NOTE — Progress Notes (Signed)
Patient was getting up and demanding to leave. He called his mother to come get him. Messaged Dr. Reesa Chew she came up to speak with patient, he again stated he wanted to leave, she stated to let him sign the AMA paper and let him leave. Patient signed the AMA paper, I removed IV before patient left. Transport was called and he left via wheelchair and transporter.

## 2019-08-24 LAB — CULTURE, BLOOD (ROUTINE X 2)
Culture: NO GROWTH
Culture: NO GROWTH
Special Requests: ADEQUATE
Special Requests: ADEQUATE

## 2020-04-20 IMAGING — CT CT ABDOMEN AND PELVIS WITH CONTRAST
2 of 5 series · 14 of 46 positions shown, 16 images · IV contrast (APPLIED)
Comparison: Scrotal ultrasound earlier this day.

CLINICAL DATA: Pain and swelling to right scrotum. Fluid collection
in the right scrotum as well as possible hernia on ultrasound.

EXAM:
CT ABDOMEN AND PELVIS WITH CONTRAST
TECHNIQUE: Multidetector CT imaging of the abdomen and pelvis was performed
using the standard protocol following bolus administration of
intravenous contrast.
CONTRAST:  100mL OMNIPAQUE IOHEXOL 300 MG/ML  SOLN

[Series 2: routine abd/pel with · axial · 0.65mm/px · z∈[-1144,-694]mm · 11 of 102 slices shown, 13 images]
[im 6/102  soft-tissue]
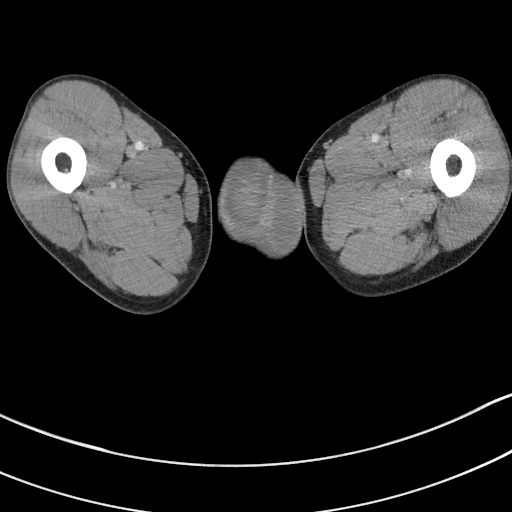
[im 6/102  bone]
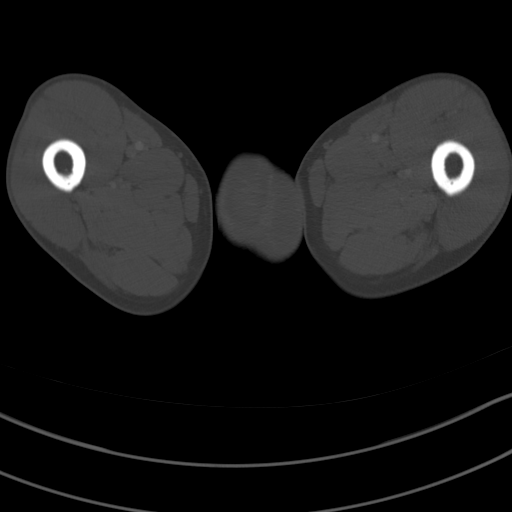
[im 17/102  soft-tissue]
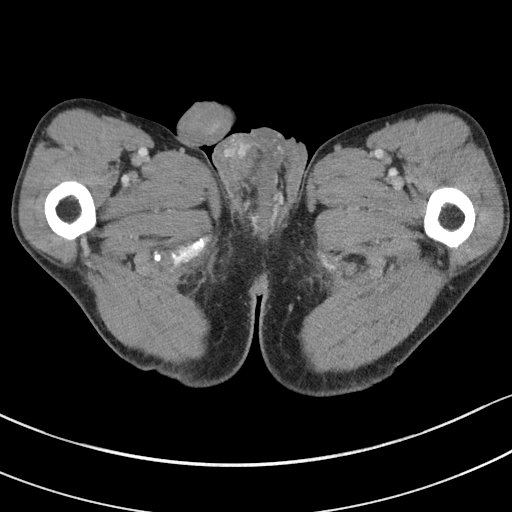
[im 23/102  soft-tissue]
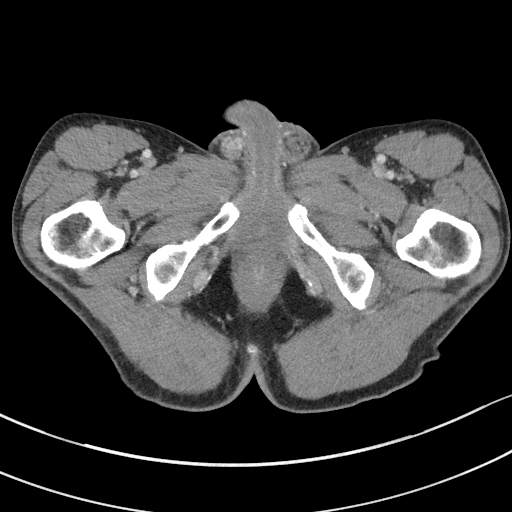
[im 34/102  soft-tissue]
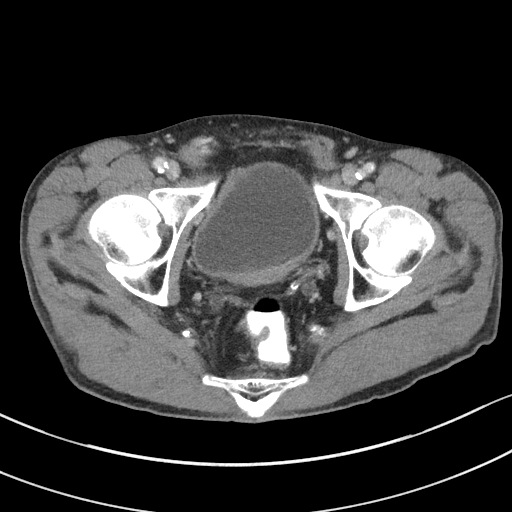
[im 40/102  soft-tissue]
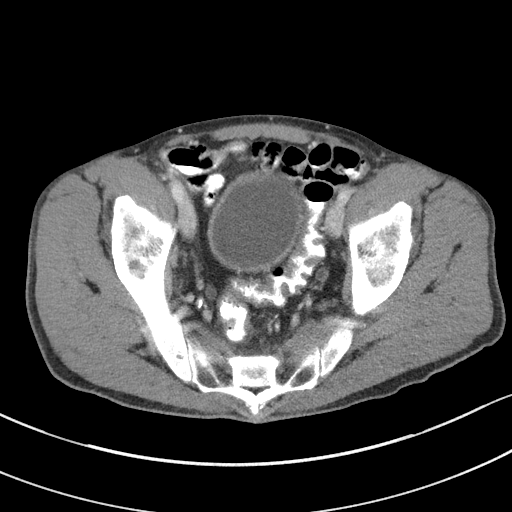
[im 51/102  soft-tissue]
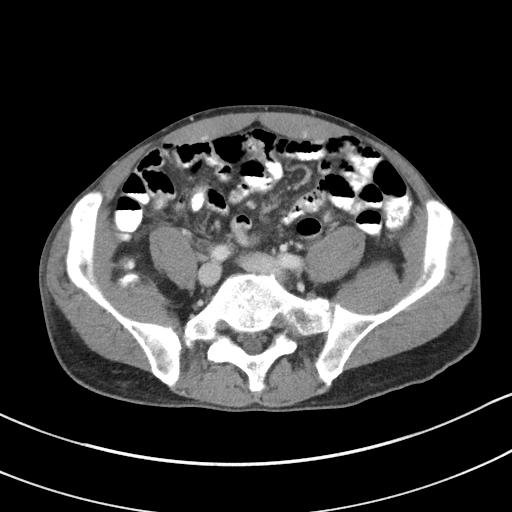
[im 62/102  soft-tissue]
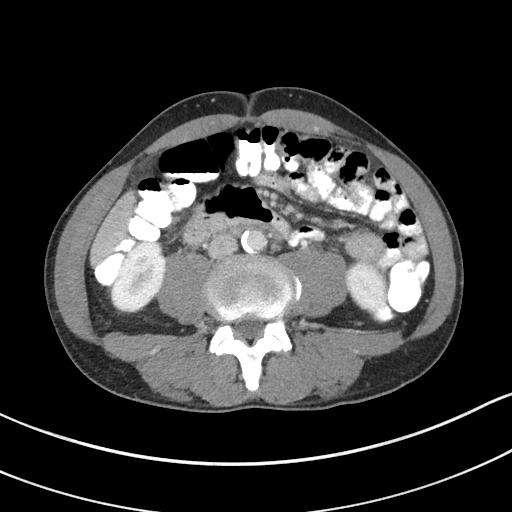
[im 68/102  soft-tissue]
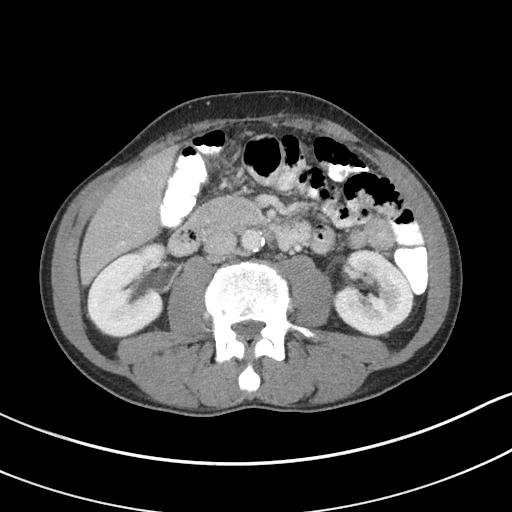
[im 79/102  soft-tissue]
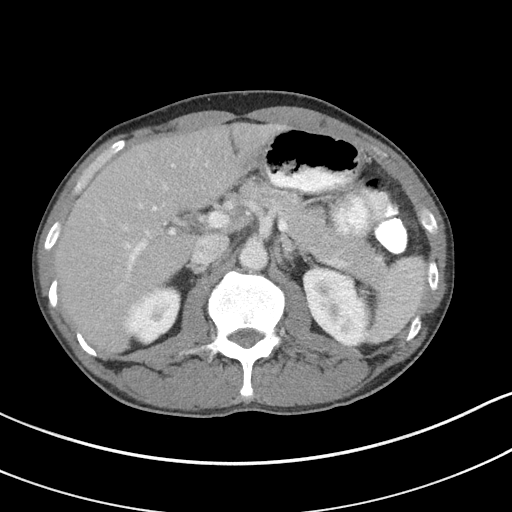
[im 79/102  bone]
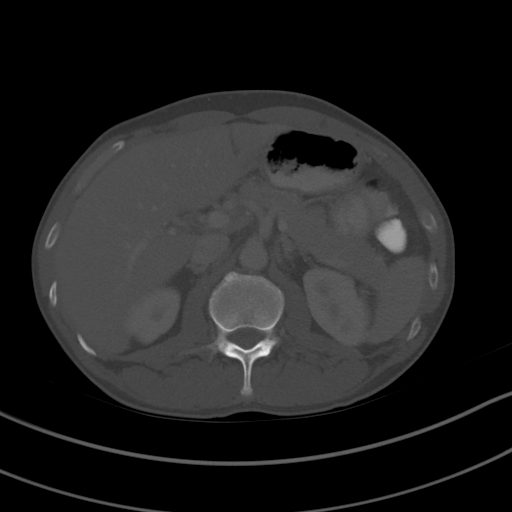
[im 85/102  soft-tissue]
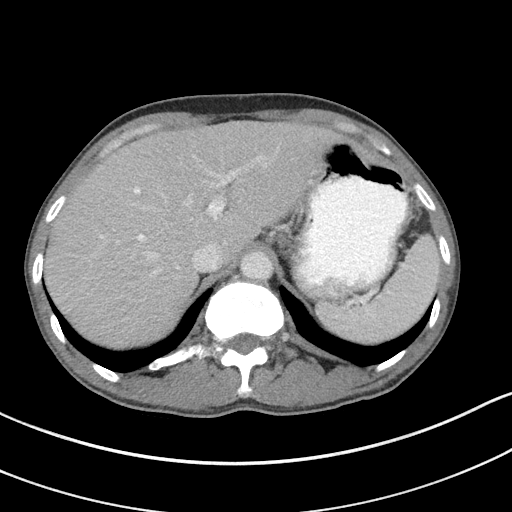
[im 96/102  soft-tissue]
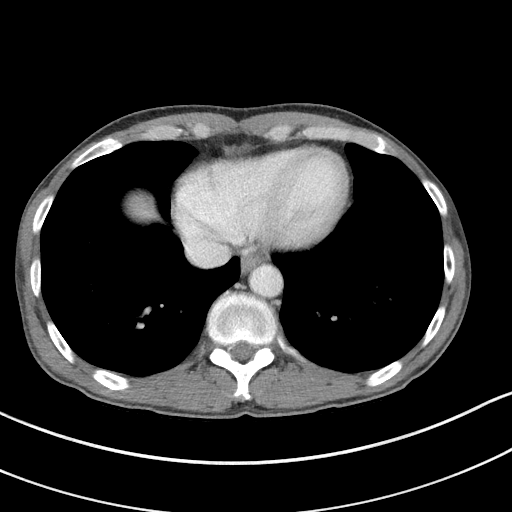

[Series 5: coronal st · coronal · 0.66mm/px · 3 of 76 slices shown]
[im 26/76  soft-tissue]
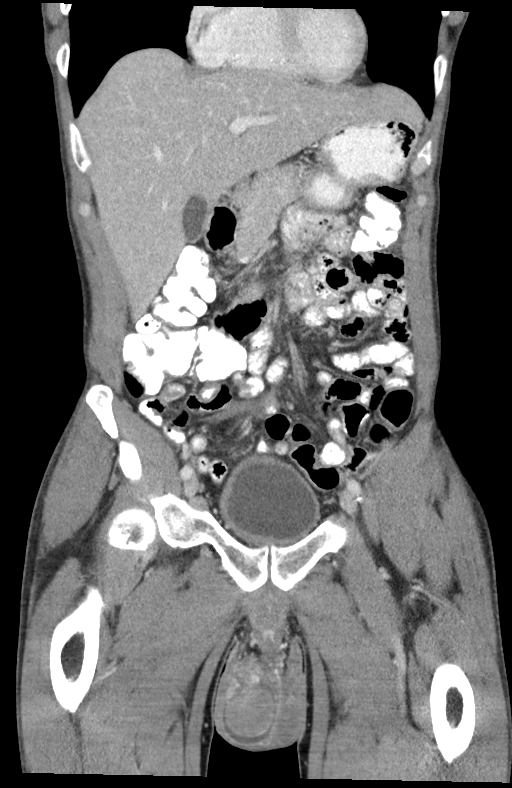
[im 34/76  soft-tissue]
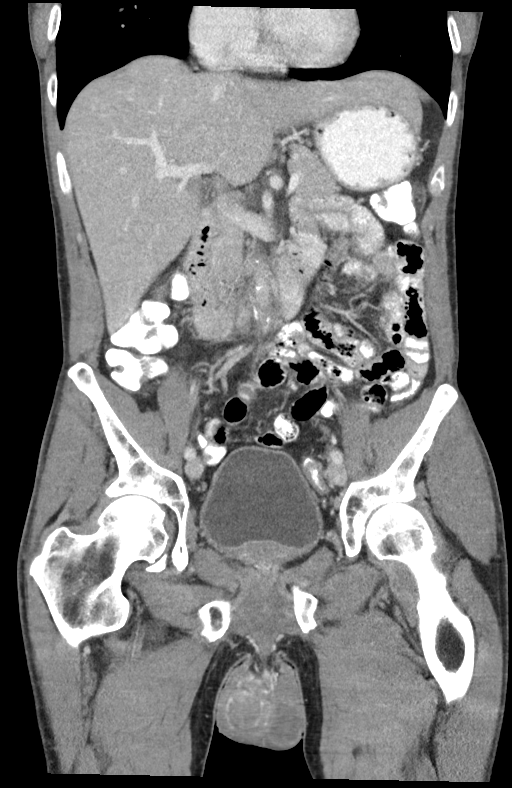
[im 42/76  soft-tissue]
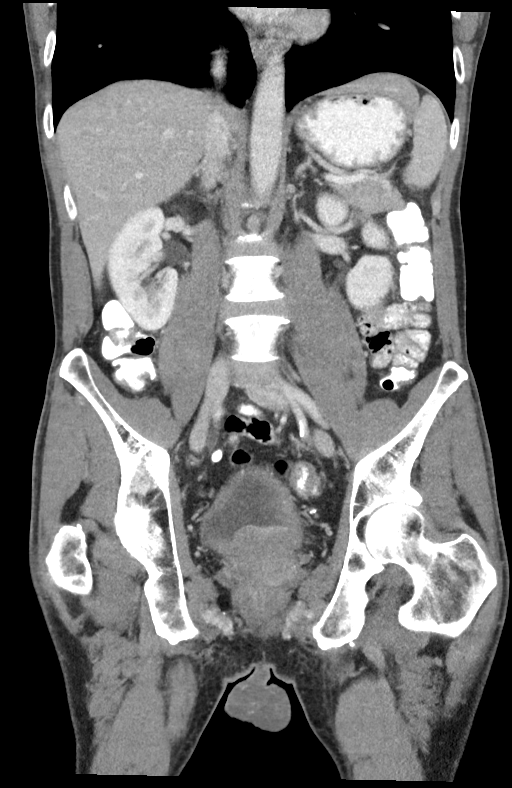

[14 of 46 positions shown; findings below may reference images not displayed]

FINDINGS: Lower chest: The lung bases are clear.

Hepatobiliary: Mild hepatic steatosis. No focal abnormality.
Gallbladder physiologically distended, no calcified stone. No
biliary dilatation.

Pancreas: No ductal dilatation or inflammation.

Spleen: Normal in size without focal abnormality.

Adrenals/Urinary Tract: Normal adrenal glands. Right
hydroureteronephrosis. No obstructing stone. Enhancement of the
right ureter with mild periureteric stranding. Mild prominence of
the left renal collecting system without ureteral dilatation.
Homogeneous renal enhancement urinary bladder is partially
distended, bladder wall thickening at the base with questionable
hyperemia.

Stomach/Bowel: Stomach physiologically distended. No bowel wall
thickening, inflammatory change, or obstruction. Administered
enteric contrast reaches the colon. No bowel containing inguinal
hernia.

Vascular/Lymphatic: Aortic atherosclerosis. No aneurysm. Portal vein
and mesenteric vessels are patent. No enlarged lymph nodes in the
abdomen or pelvis.

Reproductive: Heterogeneous prostate gland with question of
periprosthetic haziness. There is hyperemia of the right pampiniform
plexus in the inguinal canal extending into the scrotum with scrotal
hyperemia is seen on ultrasound. Small complex hydrocele without
organized scrotal collection.

Other: No evidence of perineal fluid collection. No abdominopelvic
fluid collection or abscess. No free fluid or free air.

Musculoskeletal: Hemi transitional lumbosacral anatomy. There are no
acute or suspicious osseous abnormalities.
IMPRESSION: 1. Heterogeneous prostate gland with question of periprostatic
haziness, suspicious for prostatitis.
2. Urinary bladder wall thickening likely cystitis. Mild right
hydroureteronephrosis with ureteral enhancement and periureteral
edema, suspect ascending urinary tract infection. No obstructing
stone.
3. Hyperemia of the right pampiniform plexus extending from the
inguinal canal into the scrotum increased vascularity to the right
scrotum, in keeping with sonographic findings of right epididymal
orchitis. Complex right hydrocele without organized abscess.
4. No inguinal hernia.

Aortic Atherosclerosis ([id number].E).

## 2023-07-07 ENCOUNTER — Encounter: Payer: Self-pay | Admitting: Radiology

## 2023-07-07 ENCOUNTER — Emergency Department: Payer: Medicaid Other

## 2023-07-07 ENCOUNTER — Emergency Department
Admission: EM | Admit: 2023-07-07 | Discharge: 2023-07-08 | Disposition: A | Discharge status: Home / Self Care | Payer: Medicaid Other | Attending: Emergency Medicine | Admitting: Emergency Medicine

## 2023-07-07 DIAGNOSIS — Y900 Blood alcohol level of less than 20 mg/100 ml: Secondary | ICD-10-CM | POA: Diagnosis not present

## 2023-07-07 DIAGNOSIS — R780 Finding of alcohol in blood: Secondary | ICD-10-CM | POA: Insufficient documentation

## 2023-07-07 DIAGNOSIS — R42 Dizziness and giddiness: Secondary | ICD-10-CM | POA: Diagnosis present

## 2023-07-07 DIAGNOSIS — H538 Other visual disturbances: Secondary | ICD-10-CM | POA: Diagnosis not present

## 2023-07-07 DIAGNOSIS — R7401 Elevation of levels of liver transaminase levels: Secondary | ICD-10-CM | POA: Diagnosis not present

## 2023-07-07 LAB — URINALYSIS, ROUTINE W REFLEX MICROSCOPIC
Bacteria, UA: NONE SEEN
Bilirubin Urine: NEGATIVE
Glucose, UA: NEGATIVE mg/dL
Hgb urine dipstick: NEGATIVE
Ketones, ur: 20 mg/dL — AB
Leukocytes,Ua: NEGATIVE
Nitrite: NEGATIVE
Protein, ur: 100 mg/dL — AB
Specific Gravity, Urine: 1.033 — ABNORMAL HIGH (ref 1.005–1.030)
Squamous Epithelial / HPF: 0 /[HPF] (ref 0–5)
pH: 5 (ref 5.0–8.0)

## 2023-07-07 LAB — COMPREHENSIVE METABOLIC PANEL
ALT: 123 U/L — ABNORMAL HIGH (ref 0–44)
AST: 168 U/L — ABNORMAL HIGH (ref 15–41)
Albumin: 4.8 g/dL (ref 3.5–5.0)
Alkaline Phosphatase: 109 U/L (ref 38–126)
Anion gap: 19 — ABNORMAL HIGH (ref 5–15)
BUN: 17 mg/dL (ref 6–20)
CO2: 24 mmol/L (ref 22–32)
Calcium: 9.8 mg/dL (ref 8.9–10.3)
Chloride: 89 mmol/L — ABNORMAL LOW (ref 98–111)
Creatinine, Ser: 0.71 mg/dL (ref 0.61–1.24)
GFR, Estimated: >60 mL/min (ref >60)
Glucose, Bld: 106 mg/dL — ABNORMAL HIGH (ref 70–99)
Potassium: 3.8 mmol/L (ref 3.5–5.1)
Sodium: 132 mmol/L — ABNORMAL LOW (ref 135–145)
Total Bilirubin: 1.8 mg/dL — ABNORMAL HIGH (ref 0.3–1.2)
Total Protein: 9.4 g/dL — ABNORMAL HIGH (ref 6.5–8.1)

## 2023-07-07 LAB — CBC WITH DIFFERENTIAL/PLATELET
Abs Immature Granulocytes: 0.01 10*3/uL (ref 0.00–0.07)
Basophils Absolute: 0 10*3/uL (ref 0.0–0.1)
Basophils Relative: 1 %
Eosinophils Absolute: 0 10*3/uL (ref 0.0–0.5)
Eosinophils Relative: 0 %
HCT: 45.8 % (ref 39.0–52.0)
Hemoglobin: 15.5 g/dL (ref 13.0–17.0)
Immature Granulocytes: 0 %
Lymphocytes Relative: 14 %
Lymphs Abs: 0.9 10*3/uL (ref 0.7–4.0)
MCH: 30.1 pg (ref 26.0–34.0)
MCHC: 33.8 g/dL (ref 30.0–36.0)
MCV: 88.9 fL (ref 80.0–100.0)
Monocytes Absolute: 0.6 10*3/uL (ref 0.1–1.0)
Monocytes Relative: 10 %
Neutro Abs: 4.8 10*3/uL (ref 1.7–7.7)
Neutrophils Relative %: 75 %
Platelets: 283 10*3/uL (ref 150–400)
RBC: 5.15 MIL/uL (ref 4.22–5.81)
RDW: 12.9 % (ref 11.5–15.5)
WBC: 6.3 10*3/uL (ref 4.0–10.5)
nRBC: 0 % (ref 0.0–0.2)

## 2023-07-07 LAB — URINE DRUG SCREEN, QUALITATIVE (ARMC ONLY)
Amphetamines, Ur Screen: NOT DETECTED
Barbiturates, Ur Screen: NOT DETECTED
Benzodiazepine, Ur Scrn: NOT DETECTED
Cannabinoid 50 Ng, Ur ~~LOC~~: NOT DETECTED
Cocaine Metabolite,Ur ~~LOC~~: NOT DETECTED
MDMA (Ecstasy)Ur Screen: NOT DETECTED
Methadone Scn, Ur: NOT DETECTED
Opiate, Ur Screen: NOT DETECTED
Phencyclidine (PCP) Ur S: NOT DETECTED
Tricyclic, Ur Screen: NOT DETECTED

## 2023-07-07 LAB — TROPONIN I (HIGH SENSITIVITY)
Troponin I (High Sensitivity): 7 ng/L (ref <18)
Troponin I (High Sensitivity): 5 ng/L (ref <18)

## 2023-07-07 LAB — MAGNESIUM: Magnesium: 2.5 mg/dL — ABNORMAL HIGH (ref 1.7–2.4)

## 2023-07-07 LAB — ETHANOL: Alcohol, Ethyl (B): 16 mg/dL — ABNORMAL HIGH (ref <10)

## 2023-07-07 LAB — CBG MONITORING, ED: Glucose-Capillary: 100 mg/dL — ABNORMAL HIGH (ref 70–99)

## 2023-07-07 LAB — APTT: aPTT: 25 s (ref 24–36)

## 2023-07-07 MED ORDER — LORAZEPAM 2 MG/ML IJ SOLN
1.0000 mg | Freq: Once | INTRAMUSCULAR | Status: AC
Start: 2023-07-07 — End: 2023-07-07
  Administered 2023-07-07: 1 mg via INTRAVENOUS
  Filled 2023-07-07: qty 1

## 2023-07-07 MED ORDER — SODIUM CHLORIDE 0.9 % IV BOLUS (SEPSIS)
1000.0000 mL | Freq: Once | INTRAVENOUS | Status: AC
  Administered 2023-07-08: 1000 mL via INTRAVENOUS

## 2023-07-07 MED ORDER — THIAMINE HCL 100 MG/ML IJ SOLN
100.0000 mg | Freq: Once | INTRAMUSCULAR | Status: AC
  Administered 2023-07-08: 100 mg via INTRAVENOUS
  Filled 2023-07-07: qty 2

## 2023-07-07 MED ORDER — ONDANSETRON HCL 4 MG/2ML IJ SOLN
4.0000 mg | Freq: Once | INTRAMUSCULAR | Status: AC
  Administered 2023-07-07: 4 mg via INTRAVENOUS
  Filled 2023-07-07: qty 2

## 2023-07-07 NOTE — ED Provider Notes (Signed)
[facility] Provider Note    Event Date/Time   First MD Initiated Contact with Patient 07/07/23 2321     (approximate)   History   Blurred Vision, Gait Problem, and Shortness of Breath   HPI  Kristopher Duran is a 57 y.o. male with history of alcohol abuse who presents to the emergency department with vertigo for 3 days.  States symptoms are worse when he looks to the right or the left.  He states he feels like his vision is blurry and moving when he looks side-to-side.  No numbness, tingling, weakness.  No chest pain or shortness of breath.  No headache or head injury.  Has never had similar symptoms.   History provided by patient.    Past Medical History:  Diagnosis Date   Alcohol dependence in remission (HCC)    is at [facility] and sober for 28 days    ETOH abuse    Inguinal hernia    Right    Past Surgical History:  Procedure Laterality Date   MANDIBLE FRACTURE SURGERY      MEDICATIONS:  Prior to Admission medications   Medication Sig Start Date End Date Taking? Authorizing Provider  diltiazem (CARDIZEM CD) 180 MG 24 hr capsule Take 1 capsule (180 mg total) by mouth daily. Patient not taking: Reported on 08/06/2019 12/25/18   Ihor Austin, MD  feeding supplement, ENSURE ENLIVE, (ENSURE ENLIVE) LIQD Take 237 mLs by mouth 3 (three) times daily between meals. Patient not taking: Reported on 08/06/2019 12/25/18   Ihor Austin, MD  folic acid (FOLVITE) 1 MG tablet Take 1 tablet (1 mg total) by mouth daily. Patient not taking: Reported on 08/06/2019 12/25/18   Ihor Austin, MD  thiamine 100 MG tablet Take 1 tablet (100 mg total) by mouth daily. Patient not taking: Reported on 08/06/2019 12/25/18   Ihor Austin, MD    Physical Exam   Triage Vital Signs: ED Triage Vitals  Encounter Vitals Group     BP 07/07/23 1805 (!) 129/91     Systolic BP Percentile --      Diastolic BP Percentile --      Pulse Rate 07/07/23 1805 88     Resp 07/07/23 1805 19      Temp 07/07/23 1805 97.9 F (36.6 C)     Temp Source 07/07/23 1805 Oral     SpO2 07/07/23 1805 99 %     Weight 07/07/23 1806 148 lb (67.1 kg)     Height 07/07/23 1806 5\' 8"  (1.727 m)     Head Circumference --      Peak Flow --      Pain Score --      Pain Loc --      Pain Education --      Exclude from Growth Chart --     Most recent vital signs: Vitals:   07/07/23 1805 07/07/23 2225  BP: (!) 129/91 (!) 135/92  Pulse: 88 98  Resp: 19 18  Temp: 97.9 F (36.6 C) 97.7 F (36.5 C)  SpO2: 99% 100%    CONSTITUTIONAL: Alert, responds appropriately to questions. Well-appearing; well-nourished HEAD: Normocephalic, atraumatic EYES: Conjunctivae clear, pupils appear equal, sclera nonicteric ENT: normal nose; moist mucous membranes NECK: Supple, normal ROM CARD: RRR; S1 and S2 appreciated RESP: Normal chest excursion without splinting or tachypnea; breath sounds clear and equal bilaterally; no wheezes, no rhonchi, no rales, no hypoxia or respiratory distress, speaking full sentences ABD/GI: Non-distended; soft, non-tender, no rebound, no guarding,  no peritoneal signs BACK: The back appears normal EXT: Normal ROM in all joints; no deformity noted, no edema SKIN: Normal color for age and race; warm; no rash on exposed skin NEURO: Moves all extremities equally, normal speech, normal sensation diffusely, no facial asymmetry, fatigable horizontal nystagmus bilaterally PSYCH: The patient's mood and manner are appropriate.   ED Results / Procedures / Treatments   LABS: (all labs ordered are listed, but only abnormal results are displayed) Labs Reviewed  ETHANOL - Abnormal; Notable for the following components:      Result Value   Alcohol, Ethyl (B) 16 (*)    All other components within normal limits  COMPREHENSIVE METABOLIC PANEL - Abnormal; Notable for the following components:   Sodium 132 (*)    Chloride 89 (*)    Glucose, Bld 106 (*)    Total Protein 9.4 (*)    AST 168 (*)     ALT 123 (*)    Total Bilirubin 1.8 (*)    Anion gap 19 (*)    All other components within normal limits  URINALYSIS, ROUTINE W REFLEX MICROSCOPIC - Abnormal; Notable for the following components:   Color, Urine AMBER (*)    APPearance HAZY (*)    Specific Gravity, Urine 1.033 (*)    Ketones, ur 20 (*)    Protein, ur 100 (*)    All other components within normal limits  MAGNESIUM - Abnormal; Notable for the following components:   Magnesium 2.5 (*)    All other components within normal limits  CBG MONITORING, ED - Abnormal; Notable for the following components:   Glucose-Capillary 100 (*)    All other components within normal limits  APTT  URINE DRUG SCREEN, QUALITATIVE ([facility] ONLY)  CBC WITH DIFFERENTIAL/PLATELET  TROPONIN I (HIGH SENSITIVITY)  TROPONIN I (HIGH SENSITIVITY)     EKG:  EKG Interpretation Date/Time:  Sunday July 07 2023 18:42:53 EDT Ventricular Rate:  94 PR Interval:  132 QRS Duration:  78 QT Interval:  346 QTC Calculation: 432 R Axis:   69  Text Interpretation: Normal sinus rhythm Biatrial enlargement Abnormal ECG When compared with ECG of 06-Aug-2019 21:42, PREVIOUS ECG IS PRESENT Confirmed by Rochele Raring [phone]) on 07/08/2023 1:43:34 AM         RADIOLOGY: My personal review and interpretation of imaging: MRI brain shows no stroke.  I have personally reviewed all radiology reports.   MR BRAIN WO CONTRAST  Result Date: 07/08/2023 CLINICAL DATA:  Blurred vision, feels off balance, stroke suspected EXAM: MRI HEAD WITHOUT CONTRAST TECHNIQUE: Multiplanar, multiecho pulse sequences of the brain and surrounding structures were obtained without intravenous contrast. COMPARISON:  No prior MRI available, correlation is made with 07/07/2023 CT head FINDINGS: Brain: No restricted diffusion to suggest acute or subacute infarct. No acute hemorrhage, mass, mass effect, or midline shift. No hydrocephalus or extra-axial collection. Pituitary and craniocervical  junction within normal limits. 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 Remote lacunar infarct in the left thalamus. Scattered T2 hyperintense signal in the periventricular white matter, likely the sequela of mild chronic small vessel ischemic disease. Vascular: Normal arterial flow voids. Skull and upper cervical spine: Normal marrow signal. Sinuses/Orbits: No acute finding. Other: The mastoid air cells are well aerated. IMPRESSION: No acute intracranial process. No evidence of acute or subacute infarct. Electronically Signed   By: Wiliam Ke M.D.   On: 07/08/2023 00:29   CT Head Wo Contrast  Result Date: 07/07/2023 CLINICAL  DATA:  Acute neurological deficit with stroke suspected. Gait changes in visual changes. Blurred vision in the peripheral fields and off balance feeling beginning 3 days ago. EXAM: CT HEAD WITHOUT CONTRAST TECHNIQUE: Contiguous axial images were obtained from the base of the skull through the vertex without intravenous contrast. RADIATION DOSE REDUCTION: This exam was performed according to the departmental dose-optimization program which includes automated exposure control, adjustment of the mA and/or kV according to patient size and/or use of iterative reconstruction technique. COMPARISON:  None Available. FINDINGS: Brain: No evidence of acute infarction, hemorrhage, hydrocephalus, extra-axial collection or mass lesion/mass effect. Vascular: No hyperdense vessel or unexpected calcification. Skull: Normal. Negative for fracture or focal lesion. Sinuses/Orbits: No acute finding. Other: None. IMPRESSION: No acute intracranial abnormalities. Electronically Signed   By: Burman Nieves M.D.   On: 07/07/2023 19:50   DG Chest 2 View  Result Date: 07/07/2023 CLINICAL DATA:  DIS dyspnea EXAM: CHEST - 2 VIEW COMPARISON:  None Available. FINDINGS: The heart size and mediastinal contours are within normal limits. Both lungs  are clear. The visualized skeletal structures are unremarkable. IMPRESSION: No active cardiopulmonary disease. Electronically Signed   By: Helyn Numbers M.D.   On: 07/07/2023 19:49     PROCEDURES:  Critical Care performed: No   CRITICAL CARE Performed by: Baxter Hire Lesean Woolverton   Total critical care time: 0 minutes  Critical care time was exclusive of separately billable procedures and treating other patients.  Critical care was necessary to treat or prevent imminent or life-threatening deterioration.  Critical care was time spent personally by me on the following activities: development of treatment plan with patient and/or surrogate as well as nursing, discussions with consultants, evaluation of patient's response to treatment, examination of patient, obtaining history from patient or surrogate, ordering and performing treatments and interventions, ordering and review of laboratory studies, ordering and review of radiographic studies, pulse oximetry and re-evaluation of patient's condition.   Procedures    IMPRESSION / MDM / ASSESSMENT AND PLAN / ED COURSE  I reviewed the triage vital signs and the nursing notes.    Patient here with vertigo.  He has nystagmus.  The patient is on the cardiac monitor to evaluate for evidence of arrhythmia and/or significant heart rate changes.   DIFFERENTIAL DIAGNOSIS (includes but not limited to):   Peripheral vertigo, stroke, doubt CVT, intracranial hemorrhage   Patient's presentation is most consistent with acute presentation with potential threat to life or bodily function.   PLAN: Labs obtained from triage.  He has AST greater than ALT likely from alcohol abuse.  Not having abdominal pain or vomiting.  Alcohol level is 16.  Troponin negative.  Urine shows no sign of infection.  CT head obtained from triage and reviewed/interpreted by myself and the radiologist and is unremarkable.  Will obtain MRI of the brain.  Will give IV fluids,  Ativan.   MEDICATIONS GIVEN IN ED: Medications  sodium chloride 0.9 % bolus 1,000 mL (1,000 mLs Intravenous New Bag/Given 07/08/23 0037)  LORazepam (ATIVAN) injection 1 mg (1 mg Intravenous Given 07/07/23 2344)  thiamine (VITAMIN B1) injection 100 mg (100 mg Intravenous Given 07/08/23 0038)  ondansetron (ZOFRAN) injection 4 mg (4 mg Intravenous Given 07/07/23 2351)  meclizine (ANTIVERT) tablet 50 mg (50 mg Oral Given 07/08/23 0113)     ED COURSE: MRI of the brain reviewed and interpreted by myself and the radiologist and shows no stroke.  Suspect peripheral vertigo.  Patient still symptomatic.  Will give meclizine.   Patient  feels better after meclizine.  Nystagmus has resolved.  Tolerating p.o., ambulatory.  Will discharge with prescription of meclizine, Zofran.   At this time, I do not feel there is any life-threatening condition present. I reviewed all nursing notes, vitals, pertinent previous records.  All lab and urine results, EKGs, imaging ordered have been independently reviewed and interpreted by myself.  I reviewed all available radiology reports from any imaging ordered this visit.  Based on my assessment, I feel the patient is safe to be discharged home without further emergent workup and can continue workup as an outpatient as needed. Discussed all findings, treatment plan as well as usual and customary return precautions.  They verbalize understanding and are comfortable with this plan.  Outpatient follow-up has been provided as needed.  All questions have been answered.    CONSULTS:  none   OUTSIDE RECORDS REVIEWED: Reviewed last surgical notes in 2017.       FINAL CLINICAL IMPRESSION(S) / ED DIAGNOSES   Final diagnoses:  Vertigo     Rx / DC Orders   ED Discharge Orders          Ordered    meclizine (ANTIVERT) 25 MG tablet  3 times daily PRN        07/08/23 0219    ondansetron (ZOFRAN-ODT) 4 MG disintegrating tablet  Every 6 hours PRN        07/08/23  0219    Ambulatory Referral to Primary Care (Establish Care)        07/08/23 0219             Note:  This document was prepared using Dragon voice recognition software and may include unintentional dictation errors.   Almer Bushey, Layla Maw, DO 07/08/23 [phone]

## 2023-07-07 NOTE — ED Triage Notes (Signed)
Patient reports blurred vision in his peripheral fields and feeling off balance that began approx 3 days ago; NIHSS of 0 (no visial loss appreciated on exam); He is also complaining of feeling off balances and does have a staggered gait; Denies chest pain but does have shortness of breath; Denies cough, fevers

## 2023-07-07 NOTE — ED Notes (Signed)
Patient transported to MRI 

## 2023-07-07 NOTE — ED Provider Triage Note (Signed)
Emergency Medicine Provider Triage Evaluation Note  Kristopher Duran , a 57 y.o. male  was evaluated in triage.  Pt complains of blurry vision, balance issues that began a few days ago.  Review of Systems  Positive: Blurry vision, vomiting Negative: Headache,   Physical Exam  There were no vitals taken for this visit. Gen:   Awake, no distress   Resp:  Normal effort  MSK:   Moves extremities without difficulty  Other:  No focal neuro deficits.  Medical Decision Making  Medically screening exam initiated at 6:01 PM.  Appropriate orders placed.  Nickalis Warrell was informed that the remainder of the evaluation will be completed by another provider, this initial triage assessment does not replace that evaluation, and the importance of remaining in the ED until their evaluation is complete.    Cameron Ali, PA-C 07/07/23 1803

## 2023-07-08 ENCOUNTER — Other Ambulatory Visit: Payer: Self-pay

## 2023-07-08 ENCOUNTER — Encounter: Payer: Self-pay | Admitting: Emergency Medicine

## 2023-07-08 MED ORDER — ONDANSETRON 4 MG PO TBDP: 4.0000 mg | ORAL_TABLET | Freq: Four times a day (QID) | ORAL | 0 refills | Status: DC | PRN

## 2023-07-08 MED ORDER — MECLIZINE HCL 25 MG PO TABS
50.0000 mg | ORAL_TABLET | Freq: Once | ORAL | Status: AC
  Administered 2023-07-08: 50 mg via ORAL
  Filled 2023-07-08: qty 2

## 2023-07-08 MED ORDER — MECLIZINE HCL 25 MG PO TABS: 25.0000 mg | ORAL_TABLET | Freq: Three times a day (TID) | ORAL | 0 refills | Status: DC | PRN

## 2023-07-08 NOTE — ED Notes (Signed)
Received from night shift  Pt is resting but easily aroused  Awaiting family to come and pick him up   This nurse spoke with father who is coming to get him  Pt is aware

## 2023-07-08 NOTE — ED Notes (Signed)
Pt states does not have a ride until morning because parents live in [place] and he is not waking them up in the middle of night because they are in their 43s. Denies other resources for rides

## 2023-08-26 ENCOUNTER — Emergency Department
Admission: EM | Admit: 2023-08-26 | Discharge: 2023-08-26 | Disposition: A | Discharge status: Home / Self Care | Payer: Medicaid Other | Attending: Emergency Medicine | Admitting: Emergency Medicine

## 2023-08-26 ENCOUNTER — Encounter: Payer: Self-pay | Admitting: Emergency Medicine

## 2023-08-26 ENCOUNTER — Emergency Department: Payer: Medicaid Other

## 2023-08-26 ENCOUNTER — Other Ambulatory Visit: Payer: Self-pay

## 2023-08-26 DIAGNOSIS — H5500 Unspecified nystagmus: Secondary | ICD-10-CM | POA: Insufficient documentation

## 2023-08-26 DIAGNOSIS — H538 Other visual disturbances: Secondary | ICD-10-CM | POA: Diagnosis present

## 2023-08-26 MED ORDER — ONDANSETRON 4 MG PO TBDP: 4.0000 mg | ORAL_TABLET | Freq: Three times a day (TID) | ORAL | 0 refills | Status: DC | PRN

## 2023-08-26 NOTE — ED Provider Triage Note (Signed)
Emergency Medicine Provider Triage Evaluation Note  Kristopher Duran , a 57 y.o. male  was evaluated in triage.  Pt complains of blurry vision in the periphery. Has been having it for a week.   Review of Systems  Positive: Blurry vision Negative: Weakness, numbness, headache  Physical Exam  There were no vitals taken for this visit. Gen:   Awake, no distress   Resp:  Normal effort  MSK:   Moves extremities without difficulty  Other:    Medical Decision Making  Medically screening exam initiated at 1:54 PM.  Appropriate orders placed.  Orvie Goldenstein was informed that the remainder of the evaluation will be completed by another provider, this initial triage assessment does not replace that evaluation, and the importance of remaining in the ED until their evaluation is complete.     Cameron Ali, PA-C 08/26/23 1358

## 2023-08-26 NOTE — ED Provider Notes (Signed)
[facility] Provider Note    Event Date/Time   First MD Initiated Contact with Patient 08/26/23 1515     (approximate)   History   Eye Problem   HPI  Kristopher Duran is a 57 y.o. male presents with a history of head  trauma a week ago, the left temporal area.  Patient denies loss of consciousness.  Patient reports lateral blurred vision associated with nauseas denies headaches, fever or other symptoms      Physical Exam   Triage Vital Signs: ED Triage Vitals  Encounter Vitals Group     BP 08/26/23 1356 95/69     Systolic BP Percentile --      Diastolic BP Percentile --      Pulse Rate 08/26/23 1356 90     Resp 08/26/23 1356 18     Temp 08/26/23 1356 97.9 F (36.6 C)     Temp src --      SpO2 08/26/23 1356 97 %     Weight 08/26/23 1357 145 lb (65.8 kg)     Height 08/26/23 1357 5\' 8"  (1.727 m)     Head Circumference --      Peak Flow --      Pain Score 08/26/23 1357 0     Pain Loc --      Pain Education --      Exclude from Growth Chart --     Most recent vital signs: Vitals:   08/26/23 1356  BP: 95/69  Pulse: 90  Resp: 18  Temp: 97.9 F (36.6 C)  SpO2: 97%     General: Awake, no distress.  CV:  Good peripheral perfusion.  Resp:  Normal effort.  Abd:  No distention.  Neurological exam: Patient alert and oriented x 4.  Patient presents horizontal bilateral nystagmus.  Rest of neurological exam within normal limits.  Strength 5/5 sensation intact.     ED Results / Procedures / Treatments   Labs (all labs ordered are listed, but only abnormal results are displayed) Labs Reviewed - No data to display   EKG     RADIOLOGY I independently reviewed and interpreted imaging and agree with radiologists findings.  CT head by radiology reports no intracranial abnormality.    PROCEDURES:  Critical Care performed:   Procedures   MEDICATIONS ORDERED IN ED: Medications - No data to display   IMPRESSION / MDM / ASSESSMENT  AND PLAN / ED COURSE  I reviewed the triage vital signs and the nursing notes.  Differential diagnosis includes, but is not limited to, nystagmus, peripheral vertigo, BPPV.  Stroke, subdural hematoma.  Patient's presentation is most consistent with acute, uncomplicated illness.  Patient's diagnosis is consistent with nystagmus.  Patient presenting in the ED a month ago with same symptoms and physical exam. CT scan of the head within intracranial  injury.  Ruling out possible stroke or subdural hematoma.Patient will be discharged home with prescriptions for meclizine. Patient is to follow up with neurology as needed or otherwise directed. Patient is given ED precautions to return to the ED for any worsening or new symptoms. Discussed plan of care with patient, answered all of patient's questions, Patient agreeable to plan of care. Patient to follow  up with PCP as needed. Advised patient to take medications according to the instructions on the label. Discussed possible side effects of new medications. Patient verbalized understanding.     FINAL CLINICAL IMPRESSION(S) / ED DIAGNOSES   Final diagnoses:  Nystagmus  Rx / DC Orders   ED Discharge Orders     None        Note:  This document was prepared using Dragon voice recognition software and may include unintentional dictation errors.   Gladys Damme, PA-C 08/26/23 1606    Jene Every, MD 08/27/23 [phone]

## 2023-08-26 NOTE — ED Triage Notes (Signed)
Patient to ED via [facility] for vision problem. States he has had blurred vision for the past week. Denies headache. Denies weakness or numbness.

## 2023-08-26 NOTE — Discharge Instructions (Signed)
Please call the neurologist to make an appointment.  Take the medicine as advised for nauseous and vomit.

## 2023-09-09 ENCOUNTER — Ambulatory Visit: Payer: No Typology Code available for payment source | Admitting: Nurse Practitioner

## 2023-11-11 ENCOUNTER — Ambulatory Visit: Admitting: Nurse Practitioner

## 2023-11-11 NOTE — Progress Notes (Deleted)
 There were no vitals taken for this visit.   Subjective:    Patient ID: Kristopher Duran, male    DOB: 09-08-1966, 58 y.o.   MRN: [id number]  HPI: Kristopher Duran is a 58 y.o. male  No chief complaint on file.   Discussed the use of AI scribe software for clinical note transcription with the patient, who gave verbal consent to proceed.  History of Present Illness            No data to display          Relevant past medical, surgical, family and social history reviewed and updated as indicated. Interim medical history since our last visit reviewed. Allergies and medications reviewed and updated.  Review of Systems  Per HPI unless specifically indicated above     Objective:    There were no vitals taken for this visit.  {Vitals History (Optional):23777} Wt Readings from Last 3 Encounters:  08/26/23 145 lb (65.8 kg)  07/07/23 148 lb (67.1 kg)  08/11/19 132 lb 7.9 oz (60.1 kg)    Physical Exam  Results for orders placed or performed during the hospital encounter of 07/07/23  Ethanol   Collection Time: 07/07/23  6:19 PM  Result Value Ref Range   Alcohol, Ethyl (B) 16 (H) <10 mg/dL  APTT   Collection Time: 07/07/23  6:19 PM  Result Value Ref Range   aPTT 25 24 - 36 seconds  Comprehensive metabolic panel   Collection Time: 07/07/23  6:19 PM  Result Value Ref Range   Sodium 132 (L) 135 - 145 mmol/L   Potassium 3.8 3.5 - 5.1 mmol/L   Chloride 89 (L) 98 - 111 mmol/L   CO2 24 22 - 32 mmol/L   Glucose, Bld 106 (H) 70 - 99 mg/dL   BUN 17 6 - 20 mg/dL   Creatinine, Ser 2.95 0.61 - 1.24 mg/dL   Calcium 9.8 8.9 - 28.4 mg/dL   Total Protein 9.4 (H) 6.5 - 8.1 g/dL   Albumin 4.8 3.5 - 5.0 g/dL   AST 132 (H) 15 - 41 U/L   ALT 123 (H) 0 - 44 U/L   Alkaline Phosphatase 109 38 - 126 U/L   Total Bilirubin 1.8 (H) 0.3 - 1.2 mg/dL   GFR, Estimated >44 >01 mL/min   Anion gap 19 (H) 5 - 15  CBC with Differential   Collection Time: 07/07/23  6:19 PM  Result Value Ref Range    WBC 6.3 4.0 - 10.5 K/uL   RBC 5.15 4.22 - 5.81 MIL/uL   Hemoglobin 15.5 13.0 - 17.0 g/dL   HCT 02.7 25.3 - 66.4 %   MCV 88.9 80.0 - 100.0 fL   MCH 30.1 26.0 - 34.0 pg   MCHC 33.8 30.0 - 36.0 g/dL   RDW 40.3 47.4 - 25.9 %   Platelets 283 150 - 400 K/uL   nRBC 0.0 0.0 - 0.2 %   Neutrophils Relative % 75 %   Neutro Abs 4.8 1.7 - 7.7 K/uL   Lymphocytes Relative 14 %   Lymphs Abs 0.9 0.7 - 4.0 K/uL   Monocytes Relative 10 %   Monocytes Absolute 0.6 0.1 - 1.0 K/uL   Eosinophils Relative 0 %   Eosinophils Absolute 0.0 0.0 - 0.5 K/uL   Basophils Relative 1 %   Basophils Absolute 0.0 0.0 - 0.1 K/uL   Immature Granulocytes 0 %   Abs Immature Granulocytes 0.01 0.00 - 0.07 K/uL  Troponin I (High Sensitivity)  Collection Time: 07/07/23  6:19 PM  Result Value Ref Range   Troponin I (High Sensitivity) 7 <18 ng/L  CBG monitoring, ED   Collection Time: 07/07/23  6:20 PM  Result Value Ref Range   Glucose-Capillary 100 (H) 70 - 99 mg/dL  Urine Drug Screen, Qualitative   Collection Time: 07/07/23 10:27 PM  Result Value Ref Range   Tricyclic, Ur Screen NONE DETECTED NONE DETECTED   Amphetamines, Ur Screen NONE DETECTED NONE DETECTED   MDMA (Ecstasy)Ur Screen NONE DETECTED NONE DETECTED   Cocaine Metabolite,Ur [place] NONE DETECTED NONE DETECTED   Opiate, Ur Screen NONE DETECTED NONE DETECTED   Phencyclidine (PCP) Ur S NONE DETECTED NONE DETECTED   Cannabinoid 50 Ng, Ur [place] NONE DETECTED NONE DETECTED   Barbiturates, Ur Screen NONE DETECTED NONE DETECTED   Benzodiazepine, Ur Scrn NONE DETECTED NONE DETECTED   Methadone Scn, Ur NONE DETECTED NONE DETECTED  Urinalysis, Routine w reflex microscopic -Urine, Clean Catch   Collection Time: 07/07/23 10:27 PM  Result Value Ref Range   Color, Urine AMBER (A) YELLOW   APPearance HAZY (A) CLEAR   Specific Gravity, Urine 1.033 (H) 1.005 - 1.030   pH 5.0 5.0 - 8.0   Glucose, UA NEGATIVE NEGATIVE mg/dL   Hgb urine dipstick NEGATIVE NEGATIVE   Bilirubin  Urine NEGATIVE NEGATIVE   Ketones, ur 20 (A) NEGATIVE mg/dL   Protein, ur 914 (A) NEGATIVE mg/dL   Nitrite NEGATIVE NEGATIVE   Leukocytes,Ua NEGATIVE NEGATIVE   RBC / HPF 0-5 0 - 5 RBC/hpf   WBC, UA 0-5 0 - 5 WBC/hpf   Bacteria, UA NONE SEEN NONE SEEN   Squamous Epithelial / HPF 0 0 - 5 /HPF   Mucus PRESENT   Magnesium   Collection Time: 07/07/23 10:27 PM  Result Value Ref Range   Magnesium 2.5 (H) 1.7 - 2.4 mg/dL  Troponin I (High Sensitivity)   Collection Time: 07/07/23 10:27 PM  Result Value Ref Range   Troponin I (High Sensitivity) 5 <18 ng/L   {Labs (Optional):23779}    Assessment & Plan:   Problem List Items Addressed This Visit   None    Assessment and Plan             Follow up plan: No follow-ups on file.

## 2024-01-16 ENCOUNTER — Inpatient Hospital Stay
Admission: EM | Admit: 2024-01-16 | Discharge: 2024-01-18 | DRG: 897 | Attending: Internal Medicine | Admitting: Internal Medicine

## 2024-01-16 ENCOUNTER — Emergency Department

## 2024-01-16 ENCOUNTER — Other Ambulatory Visit: Payer: Self-pay

## 2024-01-16 DIAGNOSIS — R569 Unspecified convulsions: Secondary | ICD-10-CM | POA: Diagnosis present

## 2024-01-16 DIAGNOSIS — F10231 Alcohol dependence with withdrawal delirium: Principal | ICD-10-CM | POA: Diagnosis present

## 2024-01-16 DIAGNOSIS — Z1152 Encounter for screening for COVID-19: Secondary | ICD-10-CM

## 2024-01-16 DIAGNOSIS — E876 Hypokalemia: Secondary | ICD-10-CM | POA: Diagnosis present

## 2024-01-16 DIAGNOSIS — F10931 Alcohol use, unspecified with withdrawal delirium: Principal | ICD-10-CM

## 2024-01-16 DIAGNOSIS — R17 Unspecified jaundice: Secondary | ICD-10-CM | POA: Diagnosis present

## 2024-01-16 DIAGNOSIS — E872 Acidosis, unspecified: Secondary | ICD-10-CM | POA: Diagnosis present

## 2024-01-16 DIAGNOSIS — E871 Hypo-osmolality and hyponatremia: Secondary | ICD-10-CM | POA: Diagnosis present

## 2024-01-16 DIAGNOSIS — Z79899 Other long term (current) drug therapy: Secondary | ICD-10-CM

## 2024-01-16 DIAGNOSIS — R7401 Elevation of levels of liver transaminase levels: Secondary | ICD-10-CM | POA: Diagnosis present

## 2024-01-16 LAB — COMPREHENSIVE METABOLIC PANEL WITH GFR
ALT: 35 U/L (ref 0–44)
AST: 133 U/L — ABNORMAL HIGH (ref 15–41)
Albumin: 3.7 g/dL (ref 3.5–5.0)
Alkaline Phosphatase: 95 U/L (ref 38–126)
Anion gap: 23 — ABNORMAL HIGH (ref 5–15)
BUN: 12 mg/dL (ref 6–20)
CO2: 14 mmol/L — ABNORMAL LOW (ref 22–32)
Calcium: 9.4 mg/dL (ref 8.9–10.3)
Chloride: 92 mmol/L — ABNORMAL LOW (ref 98–111)
Creatinine, Ser: 1.09 mg/dL (ref 0.61–1.24)
GFR, Estimated: >60 mL/min (ref >60)
Glucose, Bld: 186 mg/dL — ABNORMAL HIGH (ref 70–99)
Potassium: 2.7 mmol/L — CL (ref 3.5–5.1)
Sodium: 129 mmol/L — ABNORMAL LOW (ref 135–145)
Total Bilirubin: 1.3 mg/dL — ABNORMAL HIGH (ref 0.0–1.2)
Total Protein: 7.5 g/dL (ref 6.5–8.1)

## 2024-01-16 LAB — CBC WITH DIFFERENTIAL/PLATELET
Abs Immature Granulocytes: 0.01 10*3/uL (ref 0.00–0.07)
Basophils Absolute: 0 10*3/uL (ref 0.0–0.1)
Basophils Relative: 1 %
Eosinophils Absolute: 0 10*3/uL (ref 0.0–0.5)
Eosinophils Relative: 0 %
HCT: 39.2 % (ref 39.0–52.0)
Hemoglobin: 13.1 g/dL (ref 13.0–17.0)
Immature Granulocytes: 0 %
Lymphocytes Relative: 11 %
Lymphs Abs: 0.6 10*3/uL — ABNORMAL LOW (ref 0.7–4.0)
MCH: 30.6 pg (ref 26.0–34.0)
MCHC: 33.4 g/dL (ref 30.0–36.0)
MCV: 91.6 fL (ref 80.0–100.0)
Monocytes Absolute: 0.5 10*3/uL (ref 0.1–1.0)
Monocytes Relative: 8 %
Neutro Abs: 5 10*3/uL (ref 1.7–7.7)
Neutrophils Relative %: 80 %
Platelets: 170 10*3/uL (ref 150–400)
RBC: 4.28 MIL/uL (ref 4.22–5.81)
RDW: 13.1 % (ref 11.5–15.5)
WBC: 6.1 10*3/uL (ref 4.0–10.5)
nRBC: 0 % (ref 0.0–0.2)

## 2024-01-16 LAB — RESP PANEL BY RT-PCR (RSV, FLU A&B, COVID)  RVPGX2
Influenza A by PCR: NEGATIVE
Influenza B by PCR: NEGATIVE
Resp Syncytial Virus by PCR: NEGATIVE
SARS Coronavirus 2 by RT PCR: NEGATIVE

## 2024-01-16 LAB — LIPASE, BLOOD: Lipase: 49 U/L (ref 11–51)

## 2024-01-16 LAB — ETHANOL: Alcohol, Ethyl (B): <15 mg/dL (ref <15)

## 2024-01-16 MED ORDER — ONDANSETRON HCL 4 MG/2ML IJ SOLN: 4.0000 mg | Freq: Four times a day (QID) | INTRAMUSCULAR | Status: DC | PRN

## 2024-01-16 MED ORDER — PHENOBARBITAL SODIUM 130 MG/ML IJ SOLN
97.5000 mg | Freq: Three times a day (TID) | INTRAMUSCULAR | Status: DC
Start: 2024-01-16 — End: 2024-01-17
  Administered 2024-01-16 – 2024-01-17 (×2): 97.5 mg via INTRAVENOUS
  Filled 2024-01-16 (×2): qty 1

## 2024-01-16 MED ORDER — THIAMINE MONONITRATE 100 MG PO TABS
100.0000 mg | ORAL_TABLET | Freq: Every day | ORAL | Status: DC
  Administered 2024-01-16 – 2024-01-18 (×2): 100 mg via ORAL
  Filled 2024-01-16 (×2): qty 1

## 2024-01-16 MED ORDER — ACETAMINOPHEN 325 MG PO TABS: 650.0000 mg | ORAL_TABLET | Freq: Four times a day (QID) | ORAL | Status: DC | PRN

## 2024-01-16 MED ORDER — PHENOBARBITAL SODIUM 130 MG/ML IJ SOLN
260.0000 mg | Freq: Once | INTRAMUSCULAR | Status: AC
  Administered 2024-01-16: 260 mg via INTRAVENOUS
  Filled 2024-01-16: qty 2

## 2024-01-16 MED ORDER — LACTATED RINGERS IV BOLUS
500.0000 mL | Freq: Once | INTRAVENOUS | Status: AC
  Administered 2024-01-16: 500 mL via INTRAVENOUS

## 2024-01-16 MED ORDER — MECLIZINE HCL 25 MG PO TABS: 25.0000 mg | ORAL_TABLET | Freq: Three times a day (TID) | ORAL | Status: DC | PRN

## 2024-01-16 MED ORDER — ADULT MULTIVITAMIN W/MINERALS CH
1.0000 | ORAL_TABLET | Freq: Every day | ORAL | Status: DC
  Administered 2024-01-16 – 2024-01-18 (×3): 1 via ORAL
  Filled 2024-01-16 (×3): qty 1

## 2024-01-16 MED ORDER — POTASSIUM CHLORIDE 2 MEQ/ML IV SOLN
INTRAVENOUS | Status: DC
  Filled 2024-01-16 (×4): qty 1000

## 2024-01-16 MED ORDER — ENOXAPARIN SODIUM 40 MG/0.4ML IJ SOSY
40.0000 mg | PREFILLED_SYRINGE | INTRAMUSCULAR | Status: DC
  Administered 2024-01-16 – 2024-01-17 (×2): 40 mg via SUBCUTANEOUS
  Filled 2024-01-16 (×2): qty 0.4

## 2024-01-16 MED ORDER — ENSURE ENLIVE PO LIQD
237.0000 mL | Freq: Three times a day (TID) | ORAL | Status: DC
  Administered 2024-01-16 – 2024-01-18 (×3): 237 mL via ORAL

## 2024-01-16 MED ORDER — MAGNESIUM SULFATE 2 GM/50ML IV SOLN
2.0000 g | Freq: Once | INTRAVENOUS | Status: AC
  Administered 2024-01-16: 2 g via INTRAVENOUS
  Filled 2024-01-16: qty 50

## 2024-01-16 MED ORDER — LORAZEPAM 2 MG/ML IJ SOLN: 1.0000 mg | INTRAMUSCULAR | Status: DC | PRN

## 2024-01-16 MED ORDER — POTASSIUM CHLORIDE 10 MEQ/100ML IV SOLN
10.0000 meq | Freq: Once | INTRAVENOUS | Status: AC
  Administered 2024-01-16: 10 meq via INTRAVENOUS

## 2024-01-16 MED ORDER — PHENOBARBITAL SODIUM 130 MG/ML IJ SOLN
130.0000 mg | Freq: Once | INTRAMUSCULAR | Status: AC
  Administered 2024-01-16: 130 mg via INTRAVENOUS
  Filled 2024-01-16: qty 1

## 2024-01-16 MED ORDER — LORAZEPAM 2 MG/ML IJ SOLN: 2.0000 mg | INTRAMUSCULAR | Status: DC | PRN

## 2024-01-16 MED ORDER — POTASSIUM CHLORIDE 10 MEQ/100ML IV SOLN
10.0000 meq | Freq: Once | INTRAVENOUS | Status: AC
  Administered 2024-01-16: 10 meq via INTRAVENOUS
  Filled 2024-01-16 (×2): qty 100

## 2024-01-16 MED ORDER — LORAZEPAM 1 MG PO TABS: 1.0000 mg | ORAL_TABLET | ORAL | Status: DC | PRN

## 2024-01-16 MED ORDER — ONDANSETRON HCL 4 MG PO TABS
4.0000 mg | ORAL_TABLET | Freq: Four times a day (QID) | ORAL | Status: DC | PRN
Start: 2024-01-16 — End: 2024-01-18

## 2024-01-16 MED ORDER — THIAMINE HCL 100 MG/ML IJ SOLN
100.0000 mg | Freq: Every day | INTRAMUSCULAR | Status: DC
  Administered 2024-01-17: 100 mg via INTRAVENOUS
  Filled 2024-01-16: qty 2

## 2024-01-16 MED ORDER — THIAMINE HCL 100 MG PO TABS: 100.0000 mg | ORAL_TABLET | Freq: Every day | ORAL | Status: DC

## 2024-01-16 MED ORDER — HYDRALAZINE HCL 20 MG/ML IJ SOLN: 10.0000 mg | Freq: Four times a day (QID) | INTRAMUSCULAR | Status: DC | PRN

## 2024-01-16 MED ORDER — PHENOBARBITAL SODIUM 65 MG/ML IJ SOLN: 65.0000 mg | Freq: Three times a day (TID) | INTRAMUSCULAR | Status: DC

## 2024-01-16 MED ORDER — OXYCODONE HCL 5 MG PO TABS: 5.0000 mg | ORAL_TABLET | ORAL | Status: DC | PRN

## 2024-01-16 MED ORDER — ALBUTEROL SULFATE (2.5 MG/3ML) 0.083% IN NEBU: 2.5000 mg | INHALATION_SOLUTION | RESPIRATORY_TRACT | Status: DC | PRN

## 2024-01-16 MED ORDER — PHENOBARBITAL SODIUM 65 MG/ML IJ SOLN: 32.5000 mg | Freq: Three times a day (TID) | INTRAMUSCULAR | Status: DC

## 2024-01-16 MED ORDER — FOLIC ACID 1 MG PO TABS
1.0000 mg | ORAL_TABLET | Freq: Every day | ORAL | Status: DC
  Administered 2024-01-16 – 2024-01-18 (×3): 1 mg via ORAL
  Filled 2024-01-16 (×3): qty 1

## 2024-01-16 MED ORDER — POTASSIUM CHLORIDE 20 MEQ PO PACK
40.0000 meq | PACK | Freq: Once | ORAL | Status: AC
  Administered 2024-01-16: 40 meq via ORAL
  Filled 2024-01-16: qty 2

## 2024-01-16 MED ORDER — ACETAMINOPHEN 650 MG RE SUPP: 650.0000 mg | Freq: Four times a day (QID) | RECTAL | Status: DC | PRN

## 2024-01-16 NOTE — ED Provider Notes (Signed)
 [facility] Provider Note    Event Date/Time   First MD Initiated Contact with Patient 01/16/24 1516     (approximate)   History   Alcohol Intoxication  Pt BIB [facility] from [facility] jail due to two witnessed seizures due to alcohol withdrawal.  Pt has been in jail for x3 days and had two witnessed seizures 10 minutes apart lasting less than one minute each. Pt reports drinking at least one 40 oz of alcohol a day. 2mg  of versed given via EMS   136/78 141 hr 97.1 temp 95 o2   HPI Kristopher Duran is a 58 y.o. male PMH alcohol use disorder complicated by prior episodes of withdrawal, CHF presents for evaluation of seizures - Patient is currently in police custody, was in jail, noted to have 2 witnessed seizures about 10 minutes apart each lasting less than 1 minute.  These occurred around 2:30 PM. - Patient is a limited historian on my eval.  Does not remember having had seizures.  Denies any specific complaints currently.  Appears confused.  Tells me he has not on any seizure medications.  Not able to clearly state when his last drink was, states 1 day ago (though has been in jail for about 3 days)  Per chart review, last admission for delirium tremens in November 2021.  Does have a history of alcohol withdrawal seizures noted in 2020.     Physical Exam   Triage Vital Signs: ED Triage Vitals  Encounter Vitals Group     BP 01/16/24 1515 117/87     Systolic BP Percentile --      Diastolic BP Percentile --      Pulse Rate 01/16/24 1515 99     Resp 01/16/24 1515 18     Temp 01/16/24 1515 97.6 F (36.4 C)     Temp Source 01/16/24 1515 Oral     SpO2 01/16/24 1515 100 %     Weight 01/16/24 1516 123 lb 14.4 oz (56.2 kg)     Height 01/16/24 1516 5\' 8"  (1.727 m)     Head Circumference --      Peak Flow --      Pain Score 01/16/24 1516 0     Pain Loc --      Pain Education --      Exclude from Growth Chart --     Most recent vital signs: Vitals:    01/16/24 1515 01/16/24 1527  BP: 117/87 117/87  Pulse: 99 90  Resp: 18   Temp: 97.6 F (36.4 C)   SpO2: 100%      General: Awake, no distress. +tremor, +tongue wag.  HEENT:  Normocephalic, atraumatic except for left superficial lateral tongue laceration/abrasion, no active bleeding CV:  Good peripheral perfusion. RRR, RP 2+ Resp:  Normal effort. CTAB Abd:  No distention. Nontender to deep palpation throughout Neuro:  Aox2 (person, place), moving all extremities spontaneously, no focal motor deficit appreciated   ED Results / Procedures / Treatments   Labs (all labs ordered are listed, but only abnormal results are displayed) Labs Reviewed  CBC WITH DIFFERENTIAL/PLATELET - Abnormal; Notable for the following components:      Result Value   Lymphs Abs 0.6 (*)    All other components within normal limits  COMPREHENSIVE METABOLIC PANEL WITH GFR - Abnormal; Notable for the following components:   Sodium 129 (*)    Potassium 2.7 (*)    Chloride 92 (*)    CO2 14 (*)  Glucose, Bld 186 (*)    AST 133 (*)    Total Bilirubin 1.3 (*)    Anion gap 23 (*)    All other components within normal limits  ETHANOL  LIPASE, BLOOD  URINE DRUG SCREEN, QUALITATIVE ([facility] ONLY)     EKG  Ecg = sinus rhythm, rate 96, no ST elevation or depression, no significant repolarization abnormality, normal axis, normal intervals.  No evidence of ischemia nor arrhythmia on my read.   RADIOLOGY See ED course below.     PROCEDURES:  Critical Care performed: Yes, see critical care procedure note(s)  .Critical Care  Performed by: Collis Deaner, MD Authorized by: Collis Deaner, MD   Critical care provider statement:    Critical care time (minutes):  30   Critical care was necessary to treat or prevent imminent or life-threatening deterioration of the following conditions:  Toxidrome (severe alcohol withdrawal complicated by pre-hospital seizures requring IV medications and admission)    Critical care was time spent personally by me on the following activities:  Development of treatment plan with patient or surrogate, discussions with consultants, evaluation of patient's response to treatment, examination of patient, ordering and review of laboratory studies, ordering and review of radiographic studies, ordering and performing treatments and interventions, pulse oximetry, re-evaluation of patient's condition and review of old charts   I assumed direction of critical care for this patient from another provider in my specialty: no     Care discussed with: admitting provider      MEDICATIONS ORDERED IN ED: Medications  lactated ringers  bolus 500 mL (has no administration in time range)  potassium chloride  10 mEq in 100 mL IVPB (has no administration in time range)  potassium chloride  10 mEq in 100 mL IVPB (10 mEq Intravenous New Bag/Given 01/16/24 1705)  magnesium  sulfate IVPB 2 g 50 mL (2 g Intravenous New Bag/Given 01/16/24 1711)  PHENObarbital  (LUMINAL) injection 260 mg (260 mg Intravenous Given 01/16/24 1605)  potassium chloride  (KLOR-CON ) packet 40 mEq (40 mEq Oral Given 01/16/24 1708)  PHENObarbital  (LUMINAL) injection 130 mg (130 mg Intravenous Given 01/16/24 1706)     IMPRESSION / MDM / ASSESSMENT AND PLAN / ED COURSE  I reviewed the triage vital signs and the nursing notes.                              DDX/MDM/AP: Differential diagnosis includes, but is not limited to, likely alcohol withdrawal with seizures, appears to be mildly postictal currently, no obvious hallucinations currently.  Given seizures, I do anticipate need for admission.  No tongue lacerations requiring repair.  Will get screening CT head to ensure no intracranial pathology, suspect seizure secondary to alcohol withdrawal.  Plan: -Phenobarbital  for alcohol withdrawal - Labs  -EKG - CT head - seizure precautions - Anticipate admission  Patient's presentation is most consistent with acute presentation  with potential threat to life or bodily function.  The patient is on the cardiac monitor to evaluate for evidence of arrhythmia and/or significant heart rate changes.  ED course below.  Responding well to phenobarbital , received second dose in emergency department, tolerating well.  Mild tachycardia here to the low 100s.  Workup notable for significant hypokalemia to 2.7, replete and giving empiric magnesium .  Also moderate hyponatremia to 129.  Admitted to hospitalist service for ongoing management of ultralight abnormalities alcohol withdrawal with seizures/delirium tremens, remains mildly confused here, unclear if postictal or secondary to his withdrawal.  Clinical Course as of 01/16/24 1736  Thu Jan 16, 2024  1552 Cbc wnl [MM]  1610 Etoh undetectable [MM]  1638 Patient reevaluated, remains with tremors and some fasciculations.  Remains confused, but he arrived to the hospital yesterday (arrived about 1.5 hours ago).  Mild tachycardia to 106.  Will give further round of phenobarbital  [MM]  1644 CMP with severe hypokalemia to 2.7 and moderate hyponatremia to 129.  Mild transaminitis at baseline.  Bicarb low.  Will give small bolus lactated Ringer 's, IV and oral potassium, empiric magnesium .  Plan for admission. [MM]  1717 CTH: IMPRESSION: 1. Stable head CT, no acute intracranial process.  Radiology also interpreted by myself with no acute pathology. [MM]  1719 Hospitalist consult order placed. [MM]    Clinical Course User Index [MM] Collis Deaner, MD     FINAL CLINICAL IMPRESSION(S) / ED DIAGNOSES   Final diagnoses:  Alcohol withdrawal syndrome, with delirium (HCC)  Seizure (HCC)  Hypokalemia  Hyponatremia     Rx / DC Orders   ED Discharge Orders     None        Note:  This document was prepared using Dragon voice recognition software and may include unintentional dictation errors.   Collis Deaner, MD 01/16/24 [phone]

## 2024-01-16 NOTE — H&P (Signed)
 History and Physical    Kristopher Duran ZOX:[id number] DOB: 15-Apr-1966 DOA: 01/16/2024  PCP: Patient, No Pcp Per (Confirm with patient/family/NH records and if not entered, this has to be entered at [facility] point of entry) Patient coming from: Correctional facility  I have personally briefly reviewed patient's old medical records in [facility]  Chief Complaint: Seizure   HPI: Kristopher Duran is a 57 y.o. male with medical history significant of alcohol use disorder, previous episodes of withdrawal associated with delirium tremens presents from correctional facility with chief complaint of seizure x 2.  Patient has been incarcerated for 3 days and noted by correctional facility staff to have 2 witnessed tonic-clonic seizures x 2.  Both lasting approximately 1 minute.  Patient transported to the emergency room.  On my interview he has limited memory of these events.  He is aware he has had a seizure.  He is aware that he is high risk for severe withdrawals.  Patient reports drinking at least 140 ounce of alcohol each day.  2 mg of Versed given via EMS.  On my evaluation patient is sitting up in bed.  He is in no visible distress.  He has no pain complaints.  He answers all questions appropriately.  He has limited recollection of the seizure events.  No specific complaints.  No reported antiseizure medications.  Not clearly able to state when last drink was.  Presumably 3+ days ago.  Per chart review last admission for delirium tremens in November 2021.  Does have a history of alcohol withdrawal seizures noted in 2020.  ED Course: Laboratory data reviewed.  CBC within normal limits.  EtOH undetectable.  Patient valuated by EDP.  Remains with tremors and fasciculations.  Remains somewhat confused.  Mildly tachycardic.  Patient given phenobarbital  and fluid resuscitation.  CMP noted to have severe hypokalemia to 2.7 and moderate hyponatremia 129.  Mild transaminitis appears at baseline.  Head CT pursued with  no acute process.  Hospitalist contacted for admission.  Review of Systems: As per HPI otherwise 14 point review of systems negative.    Past Medical History:  Diagnosis Date   Alcohol dependence in remission (HCC)    is at [facility] and sober for 28 days    ETOH abuse    Inguinal hernia    Right    Past Surgical History:  Procedure Laterality Date   MANDIBLE FRACTURE SURGERY       reports that he has never smoked. He has never used smokeless tobacco. He reports current alcohol use. He reports that he does not currently use drugs after having used the following drugs: Marijuana, Cocaine, and "Crack" cocaine.  No Known Allergies  Family History  Problem Relation Age of Onset   Diabetes type II Father    Diabetes Father    Arthritis Mother      Prior to Admission medications   Medication Sig Start Date End Date Taking? Authorizing Provider  diltiazem  (CARDIZEM  CD) 180 MG 24 hr capsule Take 1 capsule (180 mg total) by mouth daily. Patient not taking: Reported on 08/06/2019 12/25/18   Pyreddy, Pavan, MD  feeding supplement, ENSURE ENLIVE, (ENSURE ENLIVE) LIQD Take 237 mLs by mouth 3 (three) times daily between meals. Patient not taking: Reported on 08/06/2019 12/25/18   Pyreddy, Pavan, MD  folic acid  (FOLVITE ) 1 MG tablet Take 1 tablet (1 mg total) by mouth daily. Patient not taking: Reported on 08/06/2019 12/25/18   Pyreddy, Pavan, MD  meclizine  (ANTIVERT ) 25 MG tablet Take  1 tablet (25 mg total) by mouth 3 (three) times daily as needed for dizziness. 07/08/23   Ward, Clover Dao, DO  ondansetron  (ZOFRAN -ODT) 4 MG disintegrating tablet Take 1 tablet (4 mg total) by mouth every 8 (eight) hours as needed for nausea or vomiting. 08/26/23   Awilda Lennox, PA-C  thiamine  100 MG tablet Take 1 tablet (100 mg total) by mouth daily. Patient not taking: Reported on 08/06/2019 12/25/18   Anastasio Balsam, MD    Physical Exam: Vitals:   01/16/24 1515 01/16/24 1516 01/16/24 1527  BP: 117/87   117/87  Pulse: 99  90  Resp: 18    Temp: 97.6 F (36.4 C)    TempSrc: Oral    SpO2: 100%    Weight:  56.2 kg   Height:  5\' 8"  (1.727 m)    General: No apparent distress, appears fatigued, slightly confused HEENT: Normocephalic, atraumatic Neck, supple, trachea midline, no tenderness Heart: Regular rate and rhythm, S1/S2 normal, no murmurs Lungs: Scattered crackles bilaterally.  Normal work of breathing.  Room air Abdomen: Soft, nontender, nondistended, positive bowel sounds Extremities: Normal, atraumatic, no clubbing or cyanosis, normal muscle tone Skin: No rashes or lesions, normal color Neurologic: Cranial nerves grossly intact, sensation intact, alert and oriented x2 Psychiatric: Confused affect  Labs on Admission: I have personally reviewed following labs and imaging studies  CBC: Recent Labs  Lab 01/16/24 1525  WBC 6.1  NEUTROABS 5.0  HGB 13.1  HCT 39.2  MCV 91.6  PLT 170   Basic Metabolic Panel: Recent Labs  Lab 01/16/24 1525  NA 129*  K 2.7*  CL 92*  CO2 14*  GLUCOSE 186*  BUN 12  CREATININE 1.09  CALCIUM  9.4   GFR: Estimated Creatinine Clearance: 58 mL/min (by C-G formula based on SCr of 1.09 mg/dL). Liver Function Tests: Recent Labs  Lab 01/16/24 1525  AST 133*  ALT 35  ALKPHOS 95  BILITOT 1.3*  PROT 7.5  ALBUMIN 3.7   Recent Labs  Lab 01/16/24 1525  LIPASE 49   No results for input(s): "AMMONIA" in the last 168 hours. Coagulation Profile: No results for input(s): "INR", "PROTIME" in the last 168 hours. Cardiac Enzymes: No results for input(s): "CKTOTAL", "CKMB", "CKMBINDEX", "TROPONINI" in the last 168 hours. BNP (last 3 results) No results for input(s): "PROBNP" in the last 8760 hours. HbA1C: No results for input(s): "HGBA1C" in the last 72 hours. CBG: No results for input(s): "GLUCAP" in the last 168 hours. Lipid Profile: No results for input(s): "CHOL", "HDL", "LDLCALC", "TRIG", "CHOLHDL", "LDLDIRECT" in the last 72  hours. Thyroid Function Tests: No results for input(s): "TSH", "T4TOTAL", "FREET4", "T3FREE", "THYROIDAB" in the last 72 hours. Anemia Panel: No results for input(s): "VITAMINB12", "FOLATE", "FERRITIN", "TIBC", "IRON", "RETICCTPCT" in the last 72 hours. Urine analysis:    Component Value Date/Time   COLORURINE AMBER (A) 07/07/2023 2227   APPEARANCEUR HAZY (A) 07/07/2023 2227   LABSPEC 1.033 (H) 07/07/2023 2227   PHURINE 5.0 07/07/2023 2227   GLUCOSEU NEGATIVE 07/07/2023 2227   HGBUR NEGATIVE 07/07/2023 2227   BILIRUBINUR NEGATIVE 07/07/2023 2227   KETONESUR 20 (A) 07/07/2023 2227   PROTEINUR 100 (A) 07/07/2023 2227   NITRITE NEGATIVE 07/07/2023 2227   LEUKOCYTESUR NEGATIVE 07/07/2023 2227    Radiological Exams on Admission: CT HEAD WO CONTRAST ( ) Result Date: 01/16/2024 CLINICAL DATA:  Multiple seizures, alcohol withdrawal EXAM: CT HEAD WITHOUT CONTRAST TECHNIQUE: Contiguous axial images were obtained from the base of the skull through the vertex without intravenous contrast.  RADIATION DOSE REDUCTION: This exam was performed according to the departmental dose-optimization program which includes automated exposure control, adjustment of the mA and/or kV according to patient size and/or use of iterative reconstruction technique. COMPARISON:  08/26/2023 FINDINGS: Brain: Stable hypodensity left thalamus consistent with chronic lacunar infarct. No signs of acute infarct or hemorrhage. Lateral ventricles and remaining midline structures are unremarkable. No acute extra-axial fluid collections. No mass effect. Vascular: No hyperdense vessel or unexpected calcification. Skull: Normal. Negative for fracture or focal lesion. Sinuses/Orbits: No acute finding. Other: None. IMPRESSION: 1. Stable head CT, no acute intracranial process. Electronically Signed   By: Bobbye Burrow M.D.   On: 01/16/2024 17:12    EKG: Independently reviewed.  Normal sinus rhythm  Assessment/Plan Principal Problem:    Alcohol withdrawal delirium, acute, hypoactive (HCC)  Alcohol withdrawal with associated delirium Alcohol withdrawal seizures No further seizure activity noted in ED.  Patient was given 2 mg Versed via EMS. Plan: Placed observation Seizure precautions Ativan  2 mg IV for seizure activity Phenobarbital  taper CIWA protocol with Ativan  Daily thiamine , folate, multivitamin IV fluid resuscitation  Hyponatremia Severe hypokalemia Likely in the setting of alcohol abuse.   Plan: Maintenance LR with 20 mL of fluids a potassium Received IV potassium piggyback in ED Repeat labs in a.m.  Anion gap metabolic acidosis Likely in the setting of recent seizure activity IV fluid resuscitation as above Recheck renal parameters in a.m.  Transaminitis Hyperbilirubinemia Mild and stable Monitor   DVT prophylaxis: SQ Lovenox  Code Status: Full Family Communication: None Disposition Plan: Anticipate return to previous environment 1 to 2 days Consults called: None Admission status: Observation, progressive   Tiajuana Fluke MD [facility]   If 7PM-7AM, please contact night-coverage   01/16/2024, 5:38 PM

## 2024-01-16 NOTE — ED Triage Notes (Signed)
 Pt BIB [facility] from [facility] due to two witnessed seizures due to alcohol withdrawal.  Pt has been in jail for x3 days and had two witnessed seizures 10 minutes apart lasting less than one minute each. Pt reports drinking at least one 40 oz of alcohol a day. 2mg  of versed given via EMS   136/78 141 hr 97.1 temp 95 o2

## 2024-01-16 NOTE — ED Notes (Signed)
 Pt back from CT

## 2024-01-17 ENCOUNTER — Encounter: Payer: Self-pay | Admitting: Internal Medicine

## 2024-01-17 DIAGNOSIS — E871 Hypo-osmolality and hyponatremia: Secondary | ICD-10-CM | POA: Diagnosis present

## 2024-01-17 DIAGNOSIS — E876 Hypokalemia: Secondary | ICD-10-CM | POA: Diagnosis not present

## 2024-01-17 DIAGNOSIS — R7401 Elevation of levels of liver transaminase levels: Secondary | ICD-10-CM | POA: Diagnosis present

## 2024-01-17 DIAGNOSIS — F10231 Alcohol dependence with withdrawal delirium: Secondary | ICD-10-CM | POA: Diagnosis not present

## 2024-01-17 DIAGNOSIS — E872 Acidosis, unspecified: Secondary | ICD-10-CM | POA: Diagnosis present

## 2024-01-17 DIAGNOSIS — R17 Unspecified jaundice: Secondary | ICD-10-CM | POA: Diagnosis present

## 2024-01-17 DIAGNOSIS — Z79899 Other long term (current) drug therapy: Secondary | ICD-10-CM | POA: Diagnosis not present

## 2024-01-17 DIAGNOSIS — R569 Unspecified convulsions: Secondary | ICD-10-CM | POA: Diagnosis present

## 2024-01-17 DIAGNOSIS — Z1152 Encounter for screening for COVID-19: Secondary | ICD-10-CM | POA: Diagnosis not present

## 2024-01-17 LAB — COMPREHENSIVE METABOLIC PANEL WITH GFR
ALT: 44 U/L (ref 0–44)
AST: 168 U/L — ABNORMAL HIGH (ref 15–41)
Albumin: 3.1 g/dL — ABNORMAL LOW (ref 3.5–5.0)
Alkaline Phosphatase: 79 U/L (ref 38–126)
Anion gap: 11 (ref 5–15)
BUN: 9 mg/dL (ref 6–20)
CO2: 23 mmol/L (ref 22–32)
Calcium: 8.5 mg/dL — ABNORMAL LOW (ref 8.9–10.3)
Chloride: 96 mmol/L — ABNORMAL LOW (ref 98–111)
Creatinine, Ser: 0.61 mg/dL (ref 0.61–1.24)
GFR, Estimated: >60 mL/min (ref >60)
Glucose, Bld: 75 mg/dL (ref 70–99)
Potassium: 3.2 mmol/L — ABNORMAL LOW (ref 3.5–5.1)
Sodium: 130 mmol/L — ABNORMAL LOW (ref 135–145)
Total Bilirubin: 1.6 mg/dL — ABNORMAL HIGH (ref 0.0–1.2)
Total Protein: 6.3 g/dL — ABNORMAL LOW (ref 6.5–8.1)

## 2024-01-17 LAB — CBC
HCT: 35.5 % — ABNORMAL LOW (ref 39.0–52.0)
Hemoglobin: 12.3 g/dL — ABNORMAL LOW (ref 13.0–17.0)
MCH: 30.4 pg (ref 26.0–34.0)
MCHC: 34.6 g/dL (ref 30.0–36.0)
MCV: 87.9 fL (ref 80.0–100.0)
Platelets: 155 10*3/uL (ref 150–400)
RBC: 4.04 MIL/uL — ABNORMAL LOW (ref 4.22–5.81)
RDW: 13.2 % (ref 11.5–15.5)
WBC: 5.2 10*3/uL (ref 4.0–10.5)
nRBC: 0 % (ref 0.0–0.2)

## 2024-01-17 LAB — URINE DRUG SCREEN, QUALITATIVE (ARMC ONLY)
Amphetamines, Ur Screen: NOT DETECTED
Barbiturates, Ur Screen: POSITIVE — AB
Benzodiazepine, Ur Scrn: POSITIVE — AB
Cannabinoid 50 Ng, Ur ~~LOC~~: NOT DETECTED
Cocaine Metabolite,Ur ~~LOC~~: NOT DETECTED
MDMA (Ecstasy)Ur Screen: NOT DETECTED
Methadone Scn, Ur: NOT DETECTED
Opiate, Ur Screen: NOT DETECTED
Phencyclidine (PCP) Ur S: NOT DETECTED
Tricyclic, Ur Screen: NOT DETECTED

## 2024-01-17 LAB — HIV ANTIBODY (ROUTINE TESTING W REFLEX): HIV Screen 4th Generation wRfx: NONREACTIVE

## 2024-01-17 LAB — PROTIME-INR
INR: 1.2 (ref 0.8–1.2)
Prothrombin Time: 15.7 s — ABNORMAL HIGH (ref 11.4–15.2)

## 2024-01-17 MED ORDER — PHENOBARBITAL 32.4 MG PO TABS: 32.4000 mg | ORAL_TABLET | Freq: Three times a day (TID) | ORAL | Status: DC

## 2024-01-17 MED ORDER — PHENOBARBITAL 32.4 MG PO TABS
64.8000 mg | ORAL_TABLET | Freq: Three times a day (TID) | ORAL | Status: DC
Start: 2024-01-17 — End: 2024-01-17
  Administered 2024-01-17: 64.8 mg via ORAL
  Filled 2024-01-17: qty 2

## 2024-01-17 MED ORDER — SODIUM CHLORIDE 0.9 % IV BOLUS
1000.0000 mL | Freq: Once | INTRAVENOUS | Status: AC
  Administered 2024-01-17: 1000 mL via INTRAVENOUS

## 2024-01-17 MED ORDER — POTASSIUM CHLORIDE 2 MEQ/ML IV SOLN
INTRAVENOUS | Status: DC
  Filled 2024-01-17 (×2): qty 1000

## 2024-01-17 MED ORDER — POTASSIUM CHLORIDE 2 MEQ/ML IV SOLN
INTRAVENOUS | Status: DC
  Filled 2024-01-17 (×4): qty 1000

## 2024-01-17 MED ORDER — KCL-LACTATED RINGERS 20 MEQ/L IV SOLN: INTRAVENOUS | Status: DC

## 2024-01-17 NOTE — Progress Notes (Signed)
 PROGRESS NOTE    Kristopher Duran  XBJ:[id number] DOB: February 17, 1966 DOA: 01/16/2024 PCP: Patient, No Pcp Per    Brief Narrative:   58 y.o. male with medical history significant of alcohol use disorder, previous episodes of withdrawal associated with delirium tremens presents from correctional facility with chief complaint of seizure x 2.  Patient has been incarcerated for 3 days and noted by correctional facility staff to have 2 witnessed tonic-clonic seizures x 2.  Both lasting approximately 1 minute.   Patient transported to the emergency room.  On my interview he has limited memory of these events.  He is aware he has had a seizure.  He is aware that he is high risk for severe withdrawals.  Patient reports drinking at least 140 ounce of alcohol each day.  2 mg of Versed given via EMS.   On my evaluation patient is sitting up in bed.  He is in no visible distress.  He has no pain complaints.  He answers all questions appropriately.  He has limited recollection of the seizure events.  No specific complaints.  No reported antiseizure medications.  Not clearly able to state when last drink was.  Presumably 3+ days ago.  Per chart review last admission for delirium tremens in November 2021.  Does have a history of alcohol withdrawal seizures noted in 2020.  5/2: More lethargic and slightly confused this AM.  Possibly hypoactive delirium   Assessment & Plan:   Principal Problem:   Alcohol withdrawal delirium, acute, hypoactive (HCC)   Alcohol withdrawal with associated hypoactive delirium Alcohol withdrawal seizures No further seizure activity noted in ED.  Patient was given 2 mg Versed via EMS. Remains lethargic, repeating questions Unsafe ambulation Plan: Admit inpatient Seizure precautions Ativan  2 mg IV prn for seizure activity DC phenobarbital  Continue CIWA protocol with Ativan  Daily thiamine , folate, multivitamin IV fluid resuscitation   Hyponatremia Severe hypokalemia Likely in  the setting of alcohol abuse.   Plan: Continue maintenance IVF Repeat labs in a.m.   Anion gap metabolic acidosis Improved Likely in the setting of recent seizure activity IV fluid resuscitation as above Recheck renal parameters in a.m.   Transaminitis Hyperbilirubinemia Mild and stable Monitor     DVT prophylaxis: Lovenox  Code Status: Full Family Communication:None Disposition Plan: Status is: Inpatient Remains inpatient appropriate because: Acute alcohol withdrawal with hypoactive delirium   Level of care: Telemetry Medical  Consultants:  None  Procedures:  None  Antimicrobials: None    Subjective: Seen and examined.  Sleepy, easily aroused.  Slight confusion noted on interview.  Objective: Vitals:   01/17/24 1105 01/17/24 1110 01/17/24 1115 01/17/24 1150  BP:    (!) 88/65  Pulse: (!) 111 97 (!) 102 97  Resp: (!) 26 16 20 13   Temp:      TempSrc:      SpO2: 100% 100% 99% 99%  Weight:      Height:       No intake or output data in the 24 hours ending 01/17/24 1408 Filed Weights   01/16/24 1516  Weight: 56.2 kg    Examination:  General exam: Appears calm and comfortable  Respiratory system: Clear to auscultation. Respiratory effort normal. Cardiovascular system: S1S2, RRR, no murmur Gastrointestinal system: Soft, NTND, normal BS Central nervous system: Alert and oriented x 2. Lethargic, slight confusion.  No focal neurological deficits. Extremities: Symmetric 5 x 5 power. Skin: No rashes, lesions or ulcers Psychiatry: Judgement and insight appear impaired. Mood & affect blunted.  Data Reviewed: I have personally reviewed following labs and imaging studies  CBC: Recent Labs  Lab 01/16/24 1525 01/17/24 0635  WBC 6.1 5.2  NEUTROABS 5.0  --   HGB 13.1 12.3*  HCT 39.2 35.5*  MCV 91.6 87.9  PLT 170 155   Basic Metabolic Panel: Recent Labs  Lab 01/16/24 1525 01/17/24 0635  NA 129* 130*  K 2.7* 3.2*  CL 92* 96*  CO2 14* 23   GLUCOSE 186* 75  BUN 12 9  CREATININE 1.09 0.61  CALCIUM  9.4 8.5*   GFR: Estimated Creatinine Clearance: 79 mL/min (by C-G formula based on SCr of 0.61 mg/dL). Liver Function Tests: Recent Labs  Lab 01/16/24 1525 01/17/24 0635  AST 133* 168*  ALT 35 44  ALKPHOS 95 79  BILITOT 1.3* 1.6*  PROT 7.5 6.3*  ALBUMIN 3.7 3.1*   Recent Labs  Lab 01/16/24 1525  LIPASE 49   No results for input(s): "AMMONIA" in the last 168 hours. Coagulation Profile: Recent Labs  Lab 01/17/24 0635  INR 1.2   Cardiac Enzymes: No results for input(s): "CKTOTAL", "CKMB", "CKMBINDEX", "TROPONINI" in the last 168 hours. BNP (last 3 results) No results for input(s): "PROBNP" in the last 8760 hours. HbA1C: No results for input(s): "HGBA1C" in the last 72 hours. CBG: No results for input(s): "GLUCAP" in the last 168 hours. Lipid Profile: No results for input(s): "CHOL", "HDL", "LDLCALC", "TRIG", "CHOLHDL", "LDLDIRECT" in the last 72 hours. Thyroid Function Tests: No results for input(s): "TSH", "T4TOTAL", "FREET4", "T3FREE", "THYROIDAB" in the last 72 hours. Anemia Panel: No results for input(s): "VITAMINB12", "FOLATE", "FERRITIN", "TIBC", "IRON", "RETICCTPCT" in the last 72 hours. Sepsis Labs: No results for input(s): "PROCALCITON", "LATICACIDVEN" in the last 168 hours.  Recent Results (from the past 240 hours)  Resp panel by RT-PCR (RSV, Flu A&B, Covid) Anterior Nasal Swab     Status: None   Collection Time: 01/16/24  5:56 PM   Specimen: Anterior Nasal Swab  Result Value Ref Range Status   SARS Coronavirus 2 by RT PCR NEGATIVE NEGATIVE Final    Comment: (NOTE) SARS-CoV-2 target nucleic acids are NOT DETECTED.  The SARS-CoV-2 RNA is generally detectable in upper respiratory specimens during the acute phase of infection. The lowest concentration of SARS-CoV-2 viral copies this assay can detect is 138 copies/mL. A negative result does not preclude SARS-Cov-2 infection and should not be  used as the sole basis for treatment or other patient management decisions. A negative result may occur with  improper specimen collection/handling, submission of specimen other than nasopharyngeal swab, presence of viral mutation(s) within the areas targeted by this assay, and inadequate number of viral copies(<138 copies/mL). A negative result must be combined with clinical observations, patient history, and epidemiological information. The expected result is Negative.  Fact Sheet for Patients:  BloggerCourse.com  Fact Sheet for Healthcare Providers:  SeriousBroker.it  This test is no t yet approved or cleared by the [place]  FDA and  has been authorized for detection and/or diagnosis of SARS-CoV-2 by FDA under an Emergency Use Authorization (EUA). This EUA will remain  in effect (meaning this test can be used) for the duration of the COVID-19 declaration under Section 564(b)(1) of the Act, 21 U.S.C.section 360bbb-3(b)(1), unless the authorization is terminated  or revoked sooner.       Influenza A by PCR NEGATIVE NEGATIVE Final   Influenza B by PCR NEGATIVE NEGATIVE Final    Comment: (NOTE) The Xpert Xpress SARS-CoV-2/FLU/RSV plus assay is intended as an aid  in the diagnosis of influenza from Nasopharyngeal swab specimens and should not be used as a sole basis for treatment. Nasal washings and aspirates are unacceptable for Xpert Xpress SARS-CoV-2/FLU/RSV testing.  Fact Sheet for Patients: BloggerCourse.com  Fact Sheet for Healthcare Providers: SeriousBroker.it  This test is not yet approved or cleared by the [place]  FDA and has been authorized for detection and/or diagnosis of SARS-CoV-2 by FDA under an Emergency Use Authorization (EUA). This EUA will remain in effect (meaning this test can be used) for the duration of the COVID-19 declaration under Section  564(b)(1) of the Act, 21 U.S.C. section 360bbb-3(b)(1), unless the authorization is terminated or revoked.     Resp Syncytial Virus by PCR NEGATIVE NEGATIVE Final    Comment: (NOTE) Fact Sheet for Patients: BloggerCourse.com  Fact Sheet for Healthcare Providers: SeriousBroker.it  This test is not yet approved or cleared by the [place]  FDA and has been authorized for detection and/or diagnosis of SARS-CoV-2 by FDA under an Emergency Use Authorization (EUA). This EUA will remain in effect (meaning this test can be used) for the duration of the COVID-19 declaration under Section 564(b)(1) of the Act, 21 U.S.C. section 360bbb-3(b)(1), unless the authorization is terminated or revoked.  Performed at [facility], [address] [place], [place] [address]          Radiology Studies: CT HEAD WO CONTRAST ( ) Result Date: 01/16/2024 CLINICAL DATA:  Multiple seizures, alcohol withdrawal EXAM: CT HEAD WITHOUT CONTRAST TECHNIQUE: Contiguous axial images were obtained from the base of the skull through the vertex without intravenous contrast. RADIATION DOSE REDUCTION: This exam was performed according to the departmental dose-optimization program which includes automated exposure control, adjustment of the mA and/or kV according to patient size and/or use of iterative reconstruction technique. COMPARISON:  08/26/2023 FINDINGS: Brain: Stable hypodensity left thalamus consistent with chronic lacunar infarct. No signs of acute infarct or hemorrhage. Lateral ventricles and remaining midline structures are unremarkable. No acute extra-axial fluid collections. No mass effect. Vascular: No hyperdense vessel or unexpected calcification. Skull: Normal. Negative for fracture or focal lesion. Sinuses/Orbits: No acute finding. Other: None. IMPRESSION: 1. Stable head CT, no acute intracranial process. Electronically Signed   By: Bobbye Burrow  M.D.   On: 01/16/2024 17:12        Scheduled Meds:  enoxaparin  (LOVENOX ) injection  40 mg Subcutaneous Q24H   feeding supplement  237 mL Oral TID BM   folic acid   1 mg Oral Daily   multivitamin with minerals  1 tablet Oral Daily   thiamine   100 mg Oral Daily   Or   thiamine   100 mg Intravenous Daily   Continuous Infusions:  lactated ringers  1,000 mL with potassium chloride  20 mEq infusion 100 mL/hr at 01/17/24 1233     LOS: 0 days     Tiajuana Fluke, MD [facility]   If 7PM-7AM, please contact night-coverage  01/17/2024, 2:08 PM

## 2024-01-17 NOTE — ED Notes (Signed)
Lab at bedside collecting morning labs. 

## 2024-01-17 NOTE — ED Notes (Addendum)
 Kristopher Duran

## 2024-01-17 NOTE — ED Notes (Addendum)
 On call Kristopher Dill NP made aware of patients decrease in b/p 82/64. Pt asymptomatic and completed 500 ml bolus. Patient resting in bed.

## 2024-01-17 NOTE — TOC Initial Note (Signed)
 Transition of Care [facility]) - Initial/Assessment Note    Patient Details  Name: Kristopher Duran MRN: [id number] Date of Birth: [birth year]  [facility]) CM/SW Contact:    Elmira Haddock, LCSW Phone Number: 01/17/2024, 2:37 PM  Clinical Narrative:  CSW met with patient at bedside.  Patient is in the custody of [facility].  Officer Milburn Aliment was present during visit.  Patient is receiving care at the [facility], so there's not a PCP on file.  Consult received and reviewed for SUD resources.  Patient to receive SUD support from facility where he's currently housed.  TOC to continue to follow for discharge planning.                 Expected Discharge Plan: [facility]rections Facility Barriers to Discharge: Continued Medical Work up   Patient Goals and CMS Choice            Expected Discharge Plan and Services                                              Prior Living Arrangements/Services     Patient language and need for interpreter reviewed:: Yes        Need for Family Participation in Patient Care: No (Comment)     Criminal Activity/Legal Involvement Pertinent to Current Situation/Hospitalization: Yes - Comment as needed  Activities of Daily Living      Permission Sought/Granted                  Emotional Assessment Appearance:: Appears older than stated age Attitude/Demeanor/Rapport: Engaged Affect (typically observed): Appropriate Orientation: : Oriented to Self, Oriented to Place, Oriented to  Time, Oriented to Situation   Psych Involvement: No (comment)  Admission diagnosis:  Alcohol withdrawal delirium, acute, hypoactive (HCC) [F10.231] Patient Active Problem List   Diagnosis Date Noted   Alcohol withdrawal delirium, acute, hypoactive (HCC) 01/16/2024   Anemia of chronic disease 08/10/2019   Hypotension 08/08/2019   Hypokalemia 08/08/2019   Hyponatremia 08/08/2019   Hypophosphatemia 08/08/2019   HFrEF (heart  failure with reduced ejection fraction) (HCC) 08/08/2019   Delirium tremens (HCC) 08/07/2019   Epididymoorchitis 12/24/2018   Protein-calorie malnutrition, severe 10/06/2018   Alcohol withdrawal delirium (HCC) 10/04/2018   Pedal edema 02/14/2016   Hernia of abdominal cavity 11/09/2015   Alcohol dependence in remission [facility])    PCP:  Patient, No Pcp Per Pharmacy:   [facility] [address]), [place] - [address] [place]) [place] [address] Phone: [phone] Fax: [phone]     Social Drivers of Health (SDOH) Social History: SDOH Screenings   Tobacco Use: Low Risk  (08/26/2023)   SDOH Interventions:     Readmission Risk Interventions     No data to display

## 2024-01-18 DIAGNOSIS — F10231 Alcohol dependence with withdrawal delirium: Secondary | ICD-10-CM | POA: Diagnosis not present

## 2024-01-18 LAB — CBC WITH DIFFERENTIAL/PLATELET
Abs Immature Granulocytes: 0.01 10*3/uL (ref 0.00–0.07)
Basophils Absolute: 0 10*3/uL (ref 0.0–0.1)
Basophils Relative: 1 %
Eosinophils Absolute: 0 10*3/uL (ref 0.0–0.5)
Eosinophils Relative: 1 %
HCT: 33.8 % — ABNORMAL LOW (ref 39.0–52.0)
Hemoglobin: 11.7 g/dL — ABNORMAL LOW (ref 13.0–17.0)
Immature Granulocytes: 0 %
Lymphocytes Relative: 28 %
Lymphs Abs: 1.5 10*3/uL (ref 0.7–4.0)
MCH: 30.6 pg (ref 26.0–34.0)
MCHC: 34.6 g/dL (ref 30.0–36.0)
MCV: 88.5 fL (ref 80.0–100.0)
Monocytes Absolute: 0.5 10*3/uL (ref 0.1–1.0)
Monocytes Relative: 9 %
Neutro Abs: 3.2 10*3/uL (ref 1.7–7.7)
Neutrophils Relative %: 61 %
Platelets: 141 10*3/uL — ABNORMAL LOW (ref 150–400)
RBC: 3.82 MIL/uL — ABNORMAL LOW (ref 4.22–5.81)
RDW: 13.2 % (ref 11.5–15.5)
WBC: 5.3 10*3/uL (ref 4.0–10.5)
nRBC: 0 % (ref 0.0–0.2)

## 2024-01-18 LAB — MRSA NEXT GEN BY PCR, NASAL: MRSA by PCR Next Gen: NOT DETECTED

## 2024-01-18 LAB — BASIC METABOLIC PANEL WITH GFR
Anion gap: 9 (ref 5–15)
BUN: 8 mg/dL (ref 6–20)
CO2: 24 mmol/L (ref 22–32)
Calcium: 8.1 mg/dL — ABNORMAL LOW (ref 8.9–10.3)
Chloride: 98 mmol/L (ref 98–111)
Creatinine, Ser: 0.65 mg/dL (ref 0.61–1.24)
GFR, Estimated: >60 mL/min (ref >60)
Glucose, Bld: 81 mg/dL (ref 70–99)
Potassium: 3.4 mmol/L — ABNORMAL LOW (ref 3.5–5.1)
Sodium: 131 mmol/L — ABNORMAL LOW (ref 135–145)

## 2024-01-18 MED ORDER — FOLIC ACID 1 MG PO TABS: 1.0000 mg | ORAL_TABLET | Freq: Every day | ORAL | 0 refills | Status: AC

## 2024-01-18 MED ORDER — ONDANSETRON 4 MG PO TBDP: 4.0000 mg | ORAL_TABLET | Freq: Three times a day (TID) | ORAL | 0 refills | Status: AC | PRN

## 2024-01-18 MED ORDER — ADULT MULTIVITAMIN W/MINERALS CH: 1.0000 | ORAL_TABLET | Freq: Every day | ORAL | 0 refills | Status: AC

## 2024-01-18 MED ORDER — THIAMINE HCL 100 MG PO TABS: 100.0000 mg | ORAL_TABLET | Freq: Every day | ORAL | 0 refills | Status: AC

## 2024-01-18 NOTE — Discharge Summary (Signed)
 Physician Discharge Summary  Kristopher Duran WGN:[id number] DOB: Dec 14, 1965 DOA: 01/16/2024  PCP: Patient, No Pcp Per  Admit date: 01/16/2024 Discharge date: 01/18/2024  Admitted From: Home Disposition:  Home  Recommendations for Outpatient Follow-up:  Follow up with PCP in 1-2 weeks   Home Health:No Equipment/Devices:None   Discharge Condition:Stable  CODE STATUS:FULL  Diet recommendation: Reg  Brief/Interim Summary:   58 y.o. male with medical history significant of alcohol use disorder, previous episodes of withdrawal associated with delirium tremens presents from correctional facility with chief complaint of seizure x 2.  Patient has been incarcerated for 3 days and noted by correctional facility staff to have 2 witnessed tonic-clonic seizures x 2.  Both lasting approximately 1 minute.   Patient transported to the emergency room.  On my interview he has limited memory of these events.  He is aware he has had a seizure.  He is aware that he is high risk for severe withdrawals.  Patient reports drinking at least 140 ounce of alcohol each day.  2 mg of Versed given via EMS.   On my evaluation patient is sitting up in bed.  He is in no visible distress.  He has no pain complaints.  He answers all questions appropriately.  He has limited recollection of the seizure events.  No specific complaints.  No reported antiseizure medications.  Not clearly able to state when last drink was.  Presumably 3+ days ago.  Per chart review last admission for delirium tremens in November 2021.  Does have a history of alcohol withdrawal seizures noted in 2020.   5/2: More lethargic and slightly confused this AM.  Possibly hypoactive delirium 5/3: Back to baseline.  No seizures.  CIWA 0.  Stable for dc    Discharge Diagnoses:  Principal Problem:   Alcohol withdrawal delirium, acute, hypoactive (HCC)  Alcohol withdrawal with associated hypoactive delirium Alcohol withdrawal seizures No further seizure  activity noted in ED.  Patient was given 2 mg Versed via EMS. Improved at time of discharge Plan: Stable for discharge.  No indication for phenobarbital  on DC.  Not scoring on CIWA.  Continue daily thiamine , folic acid , multivitamin.  Prescriptions been provided and printed to floor prior to discharge.  Hyponatremia Severe hypokalemia Likely in the setting of alcohol abuse.   Plan: Determine discharge.  Within reference range   Anion gap metabolic acidosis Improved Likely in the setting of recent seizure activity   Transaminitis Hyperbilirubinemia Mild and stable Monitor   Discharge Instructions  Discharge Instructions     Diet - low sodium heart healthy   Complete by: As directed    Increase activity slowly   Complete by: As directed       Allergies as of 01/18/2024   No Known Allergies      Medication List     STOP taking these medications    diltiazem  180 MG 24 hr capsule Commonly known as: CARDIZEM  CD   feeding supplement Liqd   meclizine  25 MG tablet Commonly known as: ANTIVERT        TAKE these medications    folic acid  1 MG tablet Commonly known as: FOLVITE  Take 1 tablet (1 mg total) by mouth daily.   multivitamin with minerals Tabs tablet Take 1 tablet by mouth daily. Start taking on: Jan 19, 2024   ondansetron  4 MG disintegrating tablet Commonly known as: ZOFRAN -ODT Take 1 tablet (4 mg total) by mouth every 8 (eight) hours as needed for nausea or vomiting.   thiamine  100 MG  tablet Commonly known as: VITAMIN B1 Take 1 tablet (100 mg total) by mouth daily.        No Known Allergies  Consultations: None   Procedures/Studies: CT HEAD WO CONTRAST ( ) Result Date: 01/16/2024 CLINICAL DATA:  Multiple seizures, alcohol withdrawal EXAM: CT HEAD WITHOUT CONTRAST TECHNIQUE: Contiguous axial images were obtained from the base of the skull through the vertex without intravenous contrast. RADIATION DOSE REDUCTION: This exam was performed  according to the departmental dose-optimization program which includes automated exposure control, adjustment of the mA and/or kV according to patient size and/or use of iterative reconstruction technique. COMPARISON:  08/26/2023 FINDINGS: Brain: Stable hypodensity left thalamus consistent with chronic lacunar infarct. No signs of acute infarct or hemorrhage. Lateral ventricles and remaining midline structures are unremarkable. No acute extra-axial fluid collections. No mass effect. Vascular: No hyperdense vessel or unexpected calcification. Skull: Normal. Negative for fracture or focal lesion. Sinuses/Orbits: No acute finding. Other: None. IMPRESSION: 1. Stable head CT, no acute intracranial process. Electronically Signed   By: Bobbye Burrow M.D.   On: 01/16/2024 17:12      Subjective: Seen and examined the day of discharge.  Stable no distress.  Appropriate for discharge at this time.  Discharge Exam: Vitals:   01/18/24 0538 01/18/24 0749  BP: 92/65 98/67  Pulse: 82 82  Resp: 18 18  Temp: 99.1 F (37.3 C) 98.3 F (36.8 C)  SpO2: 100% 100%   Vitals:   01/17/24 2026 01/18/24 0051 01/18/24 0538 01/18/24 0749  BP: (!) 80/62 98/67 92/65  98/67  Pulse: 94 96 82 82  Resp: 16 20 18 18   Temp: 98.7 F (37.1 C) 98.7 F (37.1 C) 99.1 F (37.3 C) 98.3 F (36.8 C)  TempSrc: Oral  Oral   SpO2: 97% 99% 100% 100%  Weight:      Height:        General: Pt is alert, awake, not in acute distress Cardiovascular: RRR, S1/S2 +, no rubs, no gallops Respiratory: CTA bilaterally, no wheezing, no rhonchi Abdominal: Soft, NT, ND, bowel sounds + Extremities: no edema, no cyanosis    The results of significant diagnostics from this hospitalization (including imaging, microbiology, ancillary and laboratory) are listed below for reference.     Microbiology: Recent Results (from the past 240 hours)  Resp panel by RT-PCR (RSV, Flu A&B, Covid) Anterior Nasal Swab     Status: None   Collection Time:  01/16/24  5:56 PM   Specimen: Anterior Nasal Swab  Result Value Ref Range Status   SARS Coronavirus 2 by RT PCR NEGATIVE NEGATIVE Final    Comment: (NOTE) SARS-CoV-2 target nucleic acids are NOT DETECTED.  The SARS-CoV-2 RNA is generally detectable in upper respiratory specimens during the acute phase of infection. The lowest concentration of SARS-CoV-2 viral copies this assay can detect is 138 copies/mL. A negative result does not preclude SARS-Cov-2 infection and should not be used as the sole basis for treatment or other patient management decisions. A negative result may occur with  improper specimen collection/handling, submission of specimen other than nasopharyngeal swab, presence of viral mutation(s) within the areas targeted by this assay, and inadequate number of viral copies(<138 copies/mL). A negative result must be combined with clinical observations, patient history, and epidemiological information. The expected result is Negative.  Fact Sheet for Patients:  BloggerCourse.com  Fact Sheet for Healthcare Providers:  SeriousBroker.it  This test is no t yet approved or cleared by the [place]  FDA and  has been authorized for detection  and/or diagnosis of SARS-CoV-2 by FDA under an Emergency Use Authorization (EUA). This EUA will remain  in effect (meaning this test can be used) for the duration of the COVID-19 declaration under Section 564(b)(1) of the Act, 21 U.S.C.section 360bbb-3(b)(1), unless the authorization is terminated  or revoked sooner.       Influenza A by PCR NEGATIVE NEGATIVE Final   Influenza B by PCR NEGATIVE NEGATIVE Final    Comment: (NOTE) The Xpert Xpress SARS-CoV-2/FLU/RSV plus assay is intended as an aid in the diagnosis of influenza from Nasopharyngeal swab specimens and should not be used as a sole basis for treatment. Nasal washings and aspirates are unacceptable for Xpert Xpress  SARS-CoV-2/FLU/RSV testing.  Fact Sheet for Patients: BloggerCourse.com  Fact Sheet for Healthcare Providers: SeriousBroker.it  This test is not yet approved or cleared by the [place]  FDA and has been authorized for detection and/or diagnosis of SARS-CoV-2 by FDA under an Emergency Use Authorization (EUA). This EUA will remain in effect (meaning this test can be used) for the duration of the COVID-19 declaration under Section 564(b)(1) of the Act, 21 U.S.C. section 360bbb-3(b)(1), unless the authorization is terminated or revoked.     Resp Syncytial Virus by PCR NEGATIVE NEGATIVE Final    Comment: (NOTE) Fact Sheet for Patients: BloggerCourse.com  Fact Sheet for Healthcare Providers: SeriousBroker.it  This test is not yet approved or cleared by the [place]  FDA and has been authorized for detection and/or diagnosis of SARS-CoV-2 by FDA under an Emergency Use Authorization (EUA). This EUA will remain in effect (meaning this test can be used) for the duration of the COVID-19 declaration under Section 564(b)(1) of the Act, 21 U.S.C. section 360bbb-3(b)(1), unless the authorization is terminated or revoked.  Performed at [facility], [address] [place], [place] [address]   MRSA Next Gen by PCR, Nasal     Status: None   Collection Time: 01/17/24 11:17 PM   Specimen: Nasal Mucosa; Nasal Swab  Result Value Ref Range Status   MRSA by PCR Next Gen NOT DETECTED NOT DETECTED Final    Comment: (NOTE) The GeneXpert MRSA Assay (FDA approved for NASAL specimens only), is one component of a comprehensive MRSA colonization surveillance program. It is not intended to diagnose MRSA infection nor to guide or monitor treatment for MRSA infections. Test performance is not FDA approved in patients less than 63 years old. Performed at [facility], [address] [place], [place] [address]      Labs: BNP (last 3 results) No results for input(s): "BNP" in the last 8760 hours. Basic Metabolic Panel: Recent Labs  Lab 01/16/24 1525 01/17/24 0635 01/18/24 0400  NA 129* 130* 131*  K 2.7* 3.2* 3.4*  CL 92* 96* 98  CO2 14* 23 24  GLUCOSE 186* 75 81  BUN 12 9 8   CREATININE 1.09 0.61 0.65  CALCIUM  9.4 8.5* 8.1*   Liver Function Tests: Recent Labs  Lab 01/16/24 1525 01/17/24 0635  AST 133* 168*  ALT 35 44  ALKPHOS 95 79  BILITOT 1.3* 1.6*  PROT 7.5 6.3*  ALBUMIN 3.7 3.1*   Recent Labs  Lab 01/16/24 1525  LIPASE 49   No results for input(s): "AMMONIA" in the last 168 hours. CBC: Recent Labs  Lab 01/16/24 1525 01/17/24 0635 01/18/24 0400  WBC 6.1 5.2 5.3  NEUTROABS 5.0  --  3.2  HGB 13.1 12.3* 11.7*  HCT 39.2 35.5* 33.8*  MCV 91.6 87.9 88.5  PLT 170 155  141*   Cardiac Enzymes: No results for input(s): "CKTOTAL", "CKMB", "CKMBINDEX", "TROPONINI" in the last 168 hours. BNP: Invalid input(s): "POCBNP" CBG: No results for input(s): "GLUCAP" in the last 168 hours. D-Dimer No results for input(s): "DDIMER" in the last 72 hours. Hgb A1c No results for input(s): "HGBA1C" in the last 72 hours. Lipid Profile No results for input(s): "CHOL", "HDL", "LDLCALC", "TRIG", "CHOLHDL", "LDLDIRECT" in the last 72 hours. Thyroid function studies No results for input(s): "TSH", "T4TOTAL", "T3FREE", "THYROIDAB" in the last 72 hours.  Invalid input(s): "FREET3" Anemia work up No results for input(s): "VITAMINB12", "FOLATE", "FERRITIN", "TIBC", "IRON", "RETICCTPCT" in the last 72 hours. Urinalysis    Component Value Date/Time   COLORURINE AMBER (A) 07/07/2023 2227   APPEARANCEUR HAZY (A) 07/07/2023 2227   LABSPEC 1.033 (H) 07/07/2023 2227   PHURINE 5.0 07/07/2023 2227   GLUCOSEU NEGATIVE 07/07/2023 2227   HGBUR NEGATIVE 07/07/2023 2227   BILIRUBINUR NEGATIVE 07/07/2023 2227   KETONESUR 20 (A) 07/07/2023 2227   PROTEINUR 100  (A) 07/07/2023 2227   NITRITE NEGATIVE 07/07/2023 2227   LEUKOCYTESUR NEGATIVE 07/07/2023 2227   Sepsis Labs Recent Labs  Lab 01/16/24 1525 01/17/24 0635 01/18/24 0400  WBC 6.1 5.2 5.3   Microbiology Recent Results (from the past 240 hours)  Resp panel by RT-PCR (RSV, Flu A&B, Covid) Anterior Nasal Swab     Status: None   Collection Time: 01/16/24  5:56 PM   Specimen: Anterior Nasal Swab  Result Value Ref Range Status   SARS Coronavirus 2 by RT PCR NEGATIVE NEGATIVE Final    Comment: (NOTE) SARS-CoV-2 target nucleic acids are NOT DETECTED.  The SARS-CoV-2 RNA is generally detectable in upper respiratory specimens during the acute phase of infection. The lowest concentration of SARS-CoV-2 viral copies this assay can detect is 138 copies/mL. A negative result does not preclude SARS-Cov-2 infection and should not be used as the sole basis for treatment or other patient management decisions. A negative result may occur with  improper specimen collection/handling, submission of specimen other than nasopharyngeal swab, presence of viral mutation(s) within the areas targeted by this assay, and inadequate number of viral copies(<138 copies/mL). A negative result must be combined with clinical observations, patient history, and epidemiological information. The expected result is Negative.  Fact Sheet for Patients:  BloggerCourse.com  Fact Sheet for Healthcare Providers:  SeriousBroker.it  This test is no t yet approved or cleared by the [place]  FDA and  has been authorized for detection and/or diagnosis of SARS-CoV-2 by FDA under an Emergency Use Authorization (EUA). This EUA will remain  in effect (meaning this test can be used) for the duration of the COVID-19 declaration under Section 564(b)(1) of the Act, 21 U.S.C.section 360bbb-3(b)(1), unless the authorization is terminated  or revoked sooner.       Influenza A by  PCR NEGATIVE NEGATIVE Final   Influenza B by PCR NEGATIVE NEGATIVE Final    Comment: (NOTE) The Xpert Xpress SARS-CoV-2/FLU/RSV plus assay is intended as an aid in the diagnosis of influenza from Nasopharyngeal swab specimens and should not be used as a sole basis for treatment. Nasal washings and aspirates are unacceptable for Xpert Xpress SARS-CoV-2/FLU/RSV testing.  Fact Sheet for Patients: BloggerCourse.com  Fact Sheet for Healthcare Providers: SeriousBroker.it  This test is not yet approved or cleared by the [place]  FDA and has been authorized for detection and/or diagnosis of SARS-CoV-2 by FDA under an Emergency Use Authorization (EUA). This EUA will remain in effect (meaning this test  can be used) for the duration of the COVID-19 declaration under Section 564(b)(1) of the Act, 21 U.S.C. section 360bbb-3(b)(1), unless the authorization is terminated or revoked.     Resp Syncytial Virus by PCR NEGATIVE NEGATIVE Final    Comment: (NOTE) Fact Sheet for Patients: BloggerCourse.com  Fact Sheet for Healthcare Providers: SeriousBroker.it  This test is not yet approved or cleared by the [place]  FDA and has been authorized for detection and/or diagnosis of SARS-CoV-2 by FDA under an Emergency Use Authorization (EUA). This EUA will remain in effect (meaning this test can be used) for the duration of the COVID-19 declaration under Section 564(b)(1) of the Act, 21 U.S.C. section 360bbb-3(b)(1), unless the authorization is terminated or revoked.  Performed at [facility], [address] [place], [place] [address]   MRSA Next Gen by PCR, Nasal     Status: None   Collection Time: 01/17/24 11:17 PM   Specimen: Nasal Mucosa; Nasal Swab  Result Value Ref Range Status   MRSA by PCR Next Gen NOT DETECTED NOT DETECTED Final    Comment: (NOTE) The GeneXpert MRSA  Assay (FDA approved for NASAL specimens only), is one component of a comprehensive MRSA colonization surveillance program. It is not intended to diagnose MRSA infection nor to guide or monitor treatment for MRSA infections. Test performance is not FDA approved in patients less than 69 years old. Performed at [facility], [address] [place], [place] [address]      Time coordinating discharge: Over 30 minutes  SIGNED:   Tiajuana Fluke, MD  [facility] 01/18/2024, 10:09 AM Pager   If 7PM-7AM, please contact night-coverage

## 2024-01-18 NOTE — Progress Notes (Signed)
 MD order received in [facility] to discharge pt home/back to correctional facility; verbally reviewed AVS with pt and the [facility] Deputy at bedside, written Rxs placed in an envelope along with AVS for the Deputy to give to the medical office at the correctional facility; no questions voiced at this time; pt discharged via wheelchair by nursing, [facility] security and the [facility] Deputy to the ED entrance

## 2024-01-18 NOTE — Evaluation (Signed)
 Physical Therapy Evaluation Patient Details Name: Kristopher Duran MRN: [id number] DOB: 07-10-1966 Today's Date: 01/18/2024  History of Present Illness  Pt admitted to [facility] on 01/16/24 from [facility] jail due to witnessed seizures due to alcohol withdrawal. Significant PMH includes: HFpEF and alcohol use disorder with previous episodes of withdrawal associated with delirium tremens.   Clinical Impression  Pt is a 58 year old M admitted to hospital on 01/16/24 for alcohol withdrawal. At baseline, pt is completely IND with ADL's, IADL's, and ambulation without AD.   Pt presents with mild balance deficits that improved with continued upright mobility. Due to deficits, pt required supervision for bed mobility, SBA for transfers without AD, and CGA to ambulate 54ft in room without AD. 4SBT indicates that pt is at low risk of falls as he is able to maintain tandem and SLS >10s, demonstrating increased hip and ankle righting strategies. Mild c/o lightheadedness with positional changes that improved with time.   Deficits limit the pt's ability to safely and independently perform ADL's, transfer, and ambulate. Pt will benefit from acute skilled PT services to address deficits for return to baseline function. Deficits expected to improve with continued medical management and upright functional mobility, therefore, no PT/DME needs anticipated at DC.             Can travel by private vehicle    Yes    Equipment Recommendations None recommended by PT     Functional Status Assessment Patient has had a recent decline in their functional status and demonstrates the ability to make significant improvements in function in a reasonable and predictable amount of time.     Precautions / Restrictions Precautions Precautions: Fall Restrictions Weight Bearing Restrictions Per Provider Order: No      Mobility  Bed Mobility               General bed mobility comments: supervision for safety to  perform supine<>sit transfer with UE support.    Transfers   Equipment used: None               General transfer comment: SBA for safety to perform STS from EOB without AD, use of BUE for support.    Ambulation/Gait   Gait Distance (Feet): 20 Feet           General Gait Details: CGA for safety to ambulate in room without AD, demonstrating mild unsteadiness that improved with time and decreased step length/foot clearance bilaterally. Notes that mobility is at baseline.    Balance Overall balance assessment: Modified Independent                               Standardized Balance Assessment Standardized Balance Assessment :  (completes 30s of 4BST and able to maintain SLS >10 sec with increased hip/ankle righting strategies, no LOB)           Pertinent Vitals/Pain Pain Assessment Pain Assessment: No/denies pain    Home Living Family/patient expects to be discharged to:: [facility] Arrangements: Other relatives                 Additional Comments: pt lives alone in a one level home with 3 STE and unilateral railing. States that cousin is available to assist as needed. No DME at home.    Prior Function               Mobility Comments: IND with ambulation, no hx of falls  ADLs Comments: IND with ADL's and IADL's     Extremity/Trunk Assessment   Upper Extremity Assessment Upper Extremity Assessment: Overall WFL for tasks assessed    Lower Extremity Assessment Lower Extremity Assessment: Overall WFL for tasks assessed       Communication   Communication Communication: No apparent difficulties    Cognition Arousal: Alert Behavior During Therapy: WFL for tasks assessed/performed   PT - Cognitive impairments: Orientation   Orientation impairments: Place, Time, Situation                     Following commands: Intact       Cueing Cueing Techniques: Verbal cues, Tactile cues     General Comments  General comments (skin integrity, edema, etc.): edema and c/o pain to L forearm at IV site, concern for infiltration, RN notified    Exercises Other Exercises Other Exercises: Pt and officer educated re: PT role/POC, DC recommendations, safety with functional mobility, deficits improved with continued medical management and withdrawal cessation   Assessment/Plan    PT Assessment Patient needs continued PT services  PT Problem List Decreased balance       PT Treatment Interventions Gait training;Functional mobility training;Therapeutic activities;Therapeutic exercise;Balance training    PT Goals (Current goals can be found in the Care Plan section)  Acute Rehab PT Goals Patient Stated Goal: "DC" PT Goal Formulation: With patient Time For Goal Achievement: 02/01/24 Potential to Achieve Goals: Good    Frequency Min 1X/week        AM-PAC PT "6 Clicks" Mobility  Outcome Measure Help needed turning from your back to your side while in a flat bed without using bedrails?: A Little Help needed moving from lying on your back to sitting on the side of a flat bed without using bedrails?: A Little Help needed moving to and from a bed to a chair (including a wheelchair)?: A Little Help needed standing up from a chair using your arms (e.g., wheelchair or bedside chair)?: A Little Help needed to walk in hospital room?: A Little Help needed climbing 3-5 steps with a railing? : A Little 6 Click Score: 18    End of Session Equipment Utilized During Treatment: Gait belt Activity Tolerance: Patient tolerated treatment well Patient left: in bed;with restraints reapplied Teacher, early years/pre in room) Nurse Communication: Mobility status PT Visit Diagnosis: Unsteadiness on feet (R26.81)    Time: 2956-2130 PT Time Calculation (min) (ACUTE ONLY): 17 min   Charges:   PT Evaluation $PT Eval Low Complexity: 1 Low   PT General Charges $$ ACUTE PT VISIT: 1 Visit         Branda Cain, PT,  DPT 10:09 AM,01/18/24 Physical Therapist - [facility]

## 2024-01-18 NOTE — Plan of Care (Signed)
  Problem: Education: Goal: Knowledge of General Education information will improve Description: Including pain rating scale, medication(s)/side effects and non-pharmacologic comfort measures Outcome: Adequate for Discharge   Problem: Health Behavior/Discharge Planning: Goal: Ability to manage health-related needs will improve Outcome: Adequate for Discharge   Problem: Clinical Measurements: Goal: Ability to maintain clinical measurements within normal limits will improve Outcome: Adequate for Discharge Goal: Will remain free from infection Outcome: Adequate for Discharge Goal: Diagnostic test results will improve Outcome: Adequate for Discharge Goal: Respiratory complications will improve Outcome: Adequate for Discharge Goal: Cardiovascular complication will be avoided Outcome: Adequate for Discharge   Problem: Activity: Goal: Risk for activity intolerance will decrease Outcome: Adequate for Discharge   Problem: Nutrition: Goal: Adequate nutrition will be maintained Outcome: Adequate for Discharge   Problem: Coping: Goal: Level of anxiety will decrease Outcome: Adequate for Discharge   Problem: Elimination: Goal: Will not experience complications related to bowel motility Outcome: Adequate for Discharge Goal: Will not experience complications related to urinary retention Outcome: Adequate for Discharge   Problem: Pain Managment: Goal: General experience of comfort will improve and/or be controlled Outcome: Adequate for Discharge   Problem: Safety: Goal: Ability to remain free from injury will improve Outcome: Adequate for Discharge   Problem: Skin Integrity: Goal: Risk for impaired skin integrity will decrease Outcome: Adequate for Discharge   Problem: Education: Goal: Knowledge of disease or condition will improve Outcome: Adequate for Discharge Goal: Understanding of discharge needs will improve Outcome: Adequate for Discharge   Problem: Health  Behavior/Discharge Planning: Goal: Ability to identify changes in lifestyle to reduce recurrence of condition will improve Outcome: Adequate for Discharge Goal: Identification of resources available to assist in meeting health care needs will improve Outcome: Adequate for Discharge   Problem: Physical Regulation: Goal: Complications related to the disease process, condition or treatment will be avoided or minimized Outcome: Adequate for Discharge   Problem: Safety: Goal: Ability to remain free from injury will improve Outcome: Adequate for Discharge   Problem: Acute Rehab PT Goals(only PT should resolve) Goal: Patient Will Transfer Sit To/From Stand Outcome: Adequate for Discharge Goal: Pt Will Perform Standing Balance Or Pre-Gait Outcome: Adequate for Discharge Goal: Pt Will Ambulate Outcome: Adequate for Discharge
